# Patient Record
Sex: Female | Born: 1945
Health system: Southern US, Community
[De-identification: ages and names within clinical notes are randomized; demographics above are authoritative.]

## PROBLEM LIST (undated history)

## (undated) DIAGNOSIS — K5790 Diverticulosis of intestine, part unspecified, without perforation or abscess without bleeding: Secondary | ICD-10-CM

## (undated) DIAGNOSIS — R000 Tachycardia, unspecified: Secondary | ICD-10-CM

## (undated) DIAGNOSIS — C801 Malignant (primary) neoplasm, unspecified: Secondary | ICD-10-CM

## (undated) DIAGNOSIS — F419 Anxiety disorder, unspecified: Secondary | ICD-10-CM

## (undated) DIAGNOSIS — Z86010 Personal history of colonic polyps: Secondary | ICD-10-CM

## (undated) DIAGNOSIS — D131 Benign neoplasm of stomach: Secondary | ICD-10-CM

## (undated) DIAGNOSIS — N6019 Diffuse cystic mastopathy of unspecified breast: Secondary | ICD-10-CM

## (undated) DIAGNOSIS — E119 Type 2 diabetes mellitus without complications: Secondary | ICD-10-CM

## (undated) DIAGNOSIS — I10 Essential (primary) hypertension: Secondary | ICD-10-CM

## (undated) DIAGNOSIS — E785 Hyperlipidemia, unspecified: Secondary | ICD-10-CM

## (undated) DIAGNOSIS — H3530 Unspecified macular degeneration: Secondary | ICD-10-CM

## (undated) DIAGNOSIS — K589 Irritable bowel syndrome without diarrhea: Secondary | ICD-10-CM

## (undated) DIAGNOSIS — H4710 Unspecified papilledema: Secondary | ICD-10-CM

## (undated) DIAGNOSIS — K219 Gastro-esophageal reflux disease without esophagitis: Secondary | ICD-10-CM

## (undated) DIAGNOSIS — N3941 Urge incontinence: Secondary | ICD-10-CM

## (undated) DIAGNOSIS — E669 Obesity, unspecified: Secondary | ICD-10-CM

## (undated) DIAGNOSIS — T7840XA Allergy, unspecified, initial encounter: Secondary | ICD-10-CM

## (undated) HISTORY — DX: Irritable bowel syndrome, unspecified: K58.9

## (undated) HISTORY — DX: Diverticulosis of intestine, part unspecified, without perforation or abscess without bleeding: K57.90

## (undated) HISTORY — DX: Essential (primary) hypertension: I10

## (undated) HISTORY — DX: Type 2 diabetes mellitus without complications: E11.9

## (undated) HISTORY — PX: COLONOSCOPY: SHX174

## (undated) HISTORY — DX: Diffuse cystic mastopathy of unspecified breast: N60.19

## (undated) HISTORY — PX: WISDOM TOOTH EXTRACTION: SHX21

## (undated) HISTORY — DX: Gastro-esophageal reflux disease without esophagitis: K21.9

## (undated) HISTORY — PX: PARTIAL HYSTERECTOMY: SHX80

## (undated) HISTORY — DX: Personal history of colonic polyps: Z86.010

## (undated) HISTORY — PX: BREAST EXCISIONAL BIOPSY: SUR124

## (undated) HISTORY — DX: Benign neoplasm of stomach: D13.1

## (undated) HISTORY — DX: Allergy, unspecified, initial encounter: T78.40XA

## (undated) HISTORY — DX: Obesity, unspecified: E66.9

## (undated) HISTORY — PX: POLYPECTOMY: SHX149

## (undated) HISTORY — PX: SHOULDER SURGERY: SHX246

## (undated) HISTORY — PX: KNEE ARTHROSCOPY: SUR90

## (undated) HISTORY — DX: Malignant (primary) neoplasm, unspecified: C80.1

## (undated) HISTORY — DX: Unspecified macular degeneration: H35.30

## (undated) HISTORY — DX: Hyperlipidemia, unspecified: E78.5

## (undated) HISTORY — PX: CHOLECYSTECTOMY: SHX55

## (undated) HISTORY — DX: Urge incontinence: N39.41

## (undated) HISTORY — DX: Tachycardia, unspecified: R00.0

## (undated) HISTORY — DX: Unspecified papilledema: H47.10

## (undated) HISTORY — PX: OTHER SURGICAL HISTORY: SHX169

## (undated) HISTORY — DX: Anxiety disorder, unspecified: F41.9

---

## 2000-09-04 ENCOUNTER — Other Ambulatory Visit: Admission: RE | Admit: 2000-09-04 | Discharge: 2000-09-04 | Payer: Self-pay | Admitting: Obstetrics and Gynecology

## 2001-11-27 ENCOUNTER — Other Ambulatory Visit: Admission: RE | Admit: 2001-11-27 | Discharge: 2001-11-27 | Payer: Self-pay | Admitting: Obstetrics and Gynecology

## 2002-05-31 ENCOUNTER — Emergency Department (HOSPITAL_COMMUNITY): Admission: EM | Admit: 2002-05-31 | Discharge: 2002-05-31 | Payer: Self-pay | Admitting: Emergency Medicine

## 2002-05-31 ENCOUNTER — Encounter: Payer: Self-pay | Admitting: Emergency Medicine

## 2002-08-24 ENCOUNTER — Encounter: Payer: Self-pay | Admitting: Gastroenterology

## 2002-08-24 ENCOUNTER — Ambulatory Visit (HOSPITAL_COMMUNITY): Admission: RE | Admit: 2002-08-24 | Discharge: 2002-08-24 | Payer: Self-pay | Admitting: Gastroenterology

## 2002-09-11 ENCOUNTER — Other Ambulatory Visit: Admission: RE | Admit: 2002-09-11 | Discharge: 2002-09-11 | Payer: Self-pay | Admitting: Obstetrics and Gynecology

## 2004-05-05 ENCOUNTER — Encounter: Admission: RE | Admit: 2004-05-05 | Discharge: 2004-05-05 | Payer: Self-pay | Admitting: General Surgery

## 2004-10-23 ENCOUNTER — Ambulatory Visit: Payer: Self-pay | Admitting: Gastroenterology

## 2004-10-26 ENCOUNTER — Encounter: Admission: RE | Admit: 2004-10-26 | Discharge: 2004-10-26 | Payer: Self-pay | Admitting: Gastroenterology

## 2004-11-21 ENCOUNTER — Encounter: Admission: RE | Admit: 2004-11-21 | Discharge: 2004-11-21 | Payer: Self-pay | Admitting: Pulmonary Disease

## 2005-05-24 ENCOUNTER — Encounter: Admission: RE | Admit: 2005-05-24 | Discharge: 2005-05-24 | Payer: Self-pay | Admitting: General Surgery

## 2005-06-11 ENCOUNTER — Encounter: Admission: RE | Admit: 2005-06-11 | Discharge: 2005-06-11 | Payer: Self-pay | Admitting: General Surgery

## 2005-07-02 ENCOUNTER — Ambulatory Visit: Payer: Self-pay | Admitting: Gastroenterology

## 2005-07-16 ENCOUNTER — Ambulatory Visit: Payer: Self-pay | Admitting: Gastroenterology

## 2005-08-08 ENCOUNTER — Encounter: Admission: RE | Admit: 2005-08-08 | Discharge: 2005-08-08 | Payer: Self-pay | Admitting: Obstetrics and Gynecology

## 2005-08-14 ENCOUNTER — Ambulatory Visit: Payer: Self-pay | Admitting: Cardiology

## 2005-08-14 ENCOUNTER — Ambulatory Visit: Payer: Self-pay | Admitting: Gastroenterology

## 2006-06-14 ENCOUNTER — Encounter: Admission: RE | Admit: 2006-06-14 | Discharge: 2006-06-14 | Payer: Self-pay | Admitting: General Surgery

## 2007-04-03 ENCOUNTER — Ambulatory Visit: Payer: Self-pay | Admitting: Gastroenterology

## 2007-04-10 ENCOUNTER — Ambulatory Visit: Payer: Self-pay | Admitting: Gastroenterology

## 2007-06-24 ENCOUNTER — Encounter: Admission: RE | Admit: 2007-06-24 | Discharge: 2007-06-24 | Payer: Self-pay

## 2008-07-09 ENCOUNTER — Encounter: Admission: RE | Admit: 2008-07-09 | Discharge: 2008-07-09 | Payer: Self-pay | Admitting: Internal Medicine

## 2008-07-30 ENCOUNTER — Encounter: Admission: RE | Admit: 2008-07-30 | Discharge: 2008-07-30 | Payer: Self-pay | Admitting: Orthopaedic Surgery

## 2009-05-30 ENCOUNTER — Encounter: Admission: RE | Admit: 2009-05-30 | Discharge: 2009-05-30 | Payer: Self-pay | Admitting: Obstetrics and Gynecology

## 2009-08-22 ENCOUNTER — Encounter: Payer: Self-pay | Admitting: Internal Medicine

## 2009-12-30 ENCOUNTER — Encounter: Payer: Self-pay | Admitting: Internal Medicine

## 2010-02-10 ENCOUNTER — Encounter: Payer: Self-pay | Admitting: Internal Medicine

## 2010-06-01 ENCOUNTER — Encounter: Admission: RE | Admit: 2010-06-01 | Discharge: 2010-06-01 | Payer: Self-pay | Admitting: Obstetrics and Gynecology

## 2010-06-16 ENCOUNTER — Encounter (INDEPENDENT_AMBULATORY_CARE_PROVIDER_SITE_OTHER): Payer: Self-pay | Admitting: *Deleted

## 2010-08-07 ENCOUNTER — Encounter: Payer: Self-pay | Admitting: Internal Medicine

## 2010-08-08 ENCOUNTER — Ambulatory Visit: Payer: Self-pay | Admitting: Internal Medicine

## 2010-08-08 DIAGNOSIS — G8929 Other chronic pain: Secondary | ICD-10-CM

## 2010-08-08 DIAGNOSIS — R1012 Left upper quadrant pain: Secondary | ICD-10-CM

## 2010-08-08 LAB — CONVERTED CEMR LAB
ALT: 41 units/L — ABNORMAL HIGH (ref 0–35)
Albumin: 4 g/dL (ref 3.5–5.2)
Amylase: 31 units/L (ref 27–131)
BUN: 16 mg/dL (ref 6–23)
CO2: 28 meq/L (ref 19–32)
Creatinine, Ser: 0.9 mg/dL (ref 0.4–1.2)
Glucose, Bld: 111 mg/dL — ABNORMAL HIGH (ref 70–99)
Lipase: 22 units/L (ref 11.0–59.0)
Lymphocytes Relative: 30.1 % (ref 12.0–46.0)
MCV: 90.3 fL (ref 78.0–100.0)
Monocytes Absolute: 0.4 10*3/uL (ref 0.1–1.0)
Platelets: 204 10*3/uL (ref 150.0–400.0)
RBC: 4.33 M/uL (ref 3.87–5.11)
RDW: 12.3 % (ref 11.5–14.6)
Sodium: 140 meq/L (ref 135–145)
Total Protein: 6.8 g/dL (ref 6.0–8.3)
WBC: 5.2 10*3/uL (ref 4.5–10.5)

## 2010-08-10 ENCOUNTER — Ambulatory Visit: Payer: Self-pay | Admitting: Internal Medicine

## 2010-11-09 ENCOUNTER — Encounter: Payer: Self-pay | Admitting: Cardiovascular Disease

## 2010-12-21 ENCOUNTER — Ambulatory Visit (HOSPITAL_COMMUNITY)
Admission: RE | Admit: 2010-12-21 | Discharge: 2010-12-21 | Payer: Self-pay | Source: Home / Self Care | Attending: Cardiovascular Disease | Admitting: Cardiovascular Disease

## 2010-12-21 ENCOUNTER — Ambulatory Visit: Admission: RE | Admit: 2010-12-21 | Discharge: 2010-12-21 | Payer: Self-pay | Source: Home / Self Care

## 2010-12-21 ENCOUNTER — Other Ambulatory Visit: Payer: Self-pay | Admitting: Cardiovascular Disease

## 2010-12-21 ENCOUNTER — Ambulatory Visit
Admission: RE | Admit: 2010-12-21 | Discharge: 2010-12-21 | Payer: Self-pay | Source: Home / Self Care | Attending: Cardiovascular Disease | Admitting: Cardiovascular Disease

## 2010-12-21 ENCOUNTER — Encounter: Payer: Self-pay | Admitting: Cardiovascular Disease

## 2010-12-21 ENCOUNTER — Ambulatory Visit: Admit: 2010-12-21 | Payer: Self-pay

## 2010-12-21 DIAGNOSIS — R002 Palpitations: Secondary | ICD-10-CM | POA: Insufficient documentation

## 2010-12-21 LAB — BASIC METABOLIC PANEL
BUN: 17 mg/dL (ref 6–23)
CO2: 28 mEq/L (ref 19–32)
Calcium: 9.4 mg/dL (ref 8.4–10.5)
Chloride: 102 mEq/L (ref 96–112)
Creatinine, Ser: 0.8 mg/dL (ref 0.4–1.2)
GFR: 72 mL/min (ref >60.00)
Glucose, Bld: 101 mg/dL — ABNORMAL HIGH (ref 70–99)
Potassium: 3.9 mEq/L (ref 3.5–5.1)
Sodium: 136 mEq/L (ref 135–145)

## 2010-12-21 LAB — TSH: TSH: 1.57 u[IU]/mL (ref 0.35–5.50)

## 2010-12-28 ENCOUNTER — Ambulatory Visit
Admission: RE | Admit: 2010-12-28 | Discharge: 2010-12-28 | Payer: Self-pay | Source: Home / Self Care | Attending: Cardiovascular Disease | Admitting: Cardiovascular Disease

## 2010-12-30 ENCOUNTER — Encounter: Payer: Self-pay | Admitting: Gastroenterology

## 2010-12-31 ENCOUNTER — Encounter: Payer: Self-pay | Admitting: General Surgery

## 2011-01-09 NOTE — Letter (Signed)
Summary: [facility]  [facility]   Imported By: Lester Bardwell 08/15/2010 12:12:48  _____________________________________________________________________  External Attachment:    Type:   Image     Comment:   External Document

## 2011-01-09 NOTE — Letter (Signed)
Summary: [facility]  [facility]   Imported By: Lester [place] Hall 08/15/2010 12:08:29  _____________________________________________________________________  External Attachment:    Type:   Image     Comment:   External Document

## 2011-01-09 NOTE — Assessment & Plan Note (Signed)
Summary: UPPER ABD PAIN...AS.   History of Present Illness Visit Type: Initial Visit Primary GI MD: Stan Head MD [facility] Primary Provider: Iven Finn Requesting Provider: n/a Chief Complaint: Upper left abdominal discomfort History of Present Illness:   65 yo white woman with left side and/or upper quadrant pain since early 2011. She noticed a pain with a zipper on that side at first. she has had chronic and intermittent soreness in this area, can flare and "draw". Alot of pressure and uncomfortable feelings, worse with sitting.  Not affected by eating. Probably moreso with movement or twisting. Nausea has been an issue. She noticed most when husband had an MI in April. He is finishing cardiac rehab now after CABG.  No change in defecation.   She had seen Dr. Annabell Howells earlier in the year. She took medication (sounds like trimethoprim-sulfamethoxazole) x 3 mos. She returns in September for follow-up. she had an [place] and cysto but no CT, she thinks.  She believes this is a new problem but had seen Dr. Victorino Dike with similar though LLQ complaints in past, and more than once.   GI Review of Systems    Reports abdominal pain and  nausea.     Location of  Abdominal pain: LUQ.    Denies acid reflux, belching, bloating, chest pain, dysphagia with liquids, dysphagia with solids, heartburn, loss of appetite, vomiting, vomiting blood, weight loss, and  weight gain.      Reports diarrhea.     Denies anal fissure, black tarry stools, change in bowel habit, constipation, diverticulosis, fecal incontinence, heme positive stool, hemorrhoids, irritable bowel syndrome, jaundice, light color stool, liver problems, rectal bleeding, and  rectal pain.    Current Medications (verified): 1)  Pravachol 40 Mg Tabs (Pravastatin Sodium) .Marland Kitchen.. 1 By Mouth Two Times A Day 2)  Lovaza 1 Gm Caps (Omega-3-Acid Ethyl Esters) .Marland Kitchen.. 1 By Mouth Three Times A Day 3)  Bisoprolol-Hydrochlorothiazide 2.5-6.25 Mg Tabs  (Bisoprolol-Hydrochlorothiazide) .Marland Kitchen.. 1 By Mouth Once Daily 4)  Coenzyme Q10 10 Mg Caps (Coenzyme Q10) .Marland Kitchen.. 1 By Mouth Once Daily 5)  Daily Multi  Tabs (Multiple Vitamins-Minerals) .Marland Kitchen.. 1 By Mouth Once Daily 6)  Chromium Picolinate 200 Mcg Tabs (Chromium Picolinate) .Marland Kitchen.. 1 By Mouth Once Daily  Allergies (verified): No Known Drug Allergies  Past History:  Past Medical History: Diverticulosis Hemorrhoids Hypertension Fibrocystic Breast Disease Hyperlipidemia, esp TG's  Past Surgical History: Reviewed history from 08/07/2010 and no changes required. Cholecystectomy Hysterectomy Knee Arthroscopy - L Breast Surgery  Family History: Reviewed history from 08/07/2010 and no changes required. Family History of Diabetes: Father, Mother Family History of Heart Disease: Father, Mother Family History of Liver Disease/Cirrhosis: Grandmother Family History of Colon Cancer: Mat. 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         All other ROS negative except as per HPI.   Vital Signs:  Patient profile:   65 year old female Height:      64 inches Weight:      180 pounds BMI:     31.01 BSA:     1.87 Pulse rate:   68 / minute Pulse rhythm:   regular BP sitting:   124 / 72  Vitals Entered By: Merri Ray CMA Duncan Dull) (August 08, 2010 9:10 AM)  Physical Exam  General:  obese, NAD Eyes:  PERRLA, no icterus. Mouth:  No deformity or lesions, dentition normal. Neck:  Supple; no masses or thyromegaly. Lungs:  Clear throughout to auscultation. Heart:  Regular rate and rhythm; no murmurs, rubs,  or bruits. Abdomen:  soft, obese, BS+ and no bruits tender to deeper palpation left mid quadrant and flanck involuntary guarding also no HSM/mass Msk:  ribs non-tender Extremities:  No clubbing, cyanosis, edema or deformities  noted. Neurologic:  Alert and  oriented x4;  grossly normal neurologically. Cervical Nodes:  No significant cervical or supraclavicular adenopathy.  Psych:  Alert and cooperative. Normal mood and affect.   Impression & Recommendations:  Problem # 1:  ABDOMINAL PAIN OTHER SPECIFIED SITE (ICD-789.09) Assessment New  LUQ, flank pain and tenderness in left mid quadrant,lateral. Sounds like she had some type of cystitis.  ? pancreatic or splenic cause. Does not sound gastric as unaffected by food. Could be diverticulitis. From history I ?ed musculoskeletal but she was not tender on ribs or with muscle tension of abdominal wall. CT and labs, review GU records. Chart review does show similar complants in past.  Orders: CT Abdomen/Pelvis with Contrast (CT Abd/Pelvis w/con) TLB-CBC Platelet - w/Differential (85025-CBCD) TLB-CMP (Comprehensive Metabolic Pnl) (80053-COMP) TLB-Amylase (82150-AMYL) TLB-Lipase (83690-LIPASE)  Patient Instructions: 1)  Please go to the basement to have your lab tests drawn today.  2)  Your CT is scheduled at [facility] on Thursday, August 10, 2010 @ 9:30am. 3)  We will call you with further follow up after reviewing these results.  4)  We will obtain your records from Dr. Annabell Howells. 5)  Please continue current medications.  6)  Copy sent to : Juline Patch, MD, Bjorn Pippin, MD 7)  The medication list was reviewed and reconciled.  All changed / newly prescribed medications were explained.  A complete medication list was provided to the patient / caregiver. Patient: Frances Brown Note: All result statuses are Final unless otherwise noted.  Tests: (1) CBC Platelet w/Diff (CBCD)   White Cell Count          5.2 K/uL                    4.5-10.5   Red Cell Count            4.33 Mil/uL                 3.87-5.11   Hemoglobin                13.7 g/dL                   54.0-98.1   Hematocrit                39.1 %                      36.0-46.0   MCV                        90.3 fl                     78.0-100.0   MCHC                      35.0 g/dL                   19.1-47.8   RDW  12.3 %                      11.5-14.6   Platelet Count            204.0 K/uL                  150.0-400.0   Neutrophil %              57.7 %                      43.0-77.0   Lymphocyte %              30.1 %                      12.0-46.0   Monocyte %                8.1 %                       3.0-12.0   Eosinophils%              3.5 %                       0.0-5.0   Basophils %               0.6 %                       0.0-3.0   Neutrophill Absolute      3.0 K/uL                    1.4-7.7   Lymphocyte Absolute       1.6 K/uL                    0.7-4.0   Monocyte Absolute         0.4 K/uL                    0.1-1.0  Eosinophils, Absolute                             0.2 K/uL                    0.0-0.7   Basophils Absolute        0.0 K/uL                    0.0-0.1  Tests: (2) CMP (COMP)   Sodium                    140 mEq/L                   135-145   Potassium                 4.8 mEq/L                   3.5-5.1   Chloride                  104 mEq/L                   96-112   Carbon Dioxide            28 mEq/L  19-32   Glucose              [H]  111 mg/dL                   11-91   BUN                       16 mg/dL                    4-78   Creatinine                0.9 mg/dL                   2.9-5.6   Total Bilirubin           0.8 mg/dL                   2.1-3.0   Alkaline Phosphatase      60 U/L                      39-117   AST                       30 U/L                      0-37   ALT                  [H]  41 U/L                      0-35   Total Protein             6.8 g/dL                    8.6-5.7   Albumin                   4.0 g/dL                    8.4-6.9   Calcium                   9.5 mg/dL                   6.2-95.2   GFR                       64.89 mL/min                >60  Tests: (3) Amylase (AMYL)   Amylase                    31 U/L                      27-131  Tests: (4) Lipase (LIPASE)   Lipase                    22.0 U/L                    11.0-59.0  Note: An exclamation mark (!) indicates a result that was not dispersed into the flowsheet. Document Creation Date: 08/08/2010 11:35 AM

## 2011-01-09 NOTE — Procedures (Signed)
Summary: Colonoscopy: Dr. Doreatha Martin: Diverticulosis (04-10-07)   Colonoscopy  Procedure date:  04/10/2007  Findings:      Results: Diverticulosis. Location:  [facility].   Findings - DIVERTICULOSIS: Splenic Flexure to Sigmoid Colon.  Procedures Next Due Date:    Colonoscopy: 04/2015  Patient Name: Frances Brown, Frances Brown MRN:  Procedure Procedures: Colonoscopy CPT: 16109.  Personnel: Endoscopist: Ulyess Mort, MD.  Exam Location: Exam performed in Outpatient Clinic. Outpatient  Patient Consent: Procedure, Alternatives, Risks and Benefits discussed, consent obtained, from patient. Consent was obtained by the RN.  Indications  Surveillance of: Adenomatous Polyp(s).  History  Current Medications: Patient is not currently taking Coumadin.  Pre-Exam Physical: Performed Jul 02, 2002. Entire physical exam was normal. Rectal exam, Extremity exam, Mental status exam WNL.  Comments: Pt. history reviewed/updated, physical exam performed prior to initiation of sedation? Exam Exam: Extent of exam reached: Cecum, extent intended: Cecum.  The cecum was identified by appendiceal orifice and IC valve. Colon retroflexion performed. Images taken. ASA Classification: II. Tolerance: good.  Monitoring: Pulse and BP monitoring, Oximetry used. Supplemental O2 given.  Sedation Meds: Patient assessed and found to be appropriate for moderate (conscious) sedation. Fentanyl given IV. Versed given IV.  Findings - DIVERTICULOSIS: Splenic Flexure to Sigmoid Colon. ICD9: Diverticulosis, Colon: 562.10. Comments: mild.   Assessment Abnormal examination, see findings above.  Diagnoses: 562.10: Diverticulosis, Colon.   Events  Unplanned Interventions: No intervention was required.  Unplanned Events: There were no complications. Plans Medication Plan: Continue current medications.  Patient Education: Patient given standard instructions for: Diverticulosis. Patient instructed  to get routine colonoscopy every 8 years.  Disposition: After procedure patient sent to recovery. After recovery patient sent home.   This report was created from the original endoscopy report, which was reviewed and signed by the above listed endoscopist.

## 2011-01-09 NOTE — Letter (Signed)
Summary: New Patient letter  Frances Brown - [facility]  [address] [place], [place] [address]   Phone: [phone]  Fax: [phone]       06/16/2010 MRN: [id number]  [facility] [address] [place] [address] [place], [place]  [address]  Dear [name],  Welcome to the Gastroenterology Division at [facility].    You are scheduled to see Dr. Leone Payor on 08/08/2010 at 9:00AM on the 3rd floor at [facility], [address].  We ask that you try to arrive at our office 15 minutes prior to your appointment time to allow for check-in.  We would like you to complete the enclosed self-administered evaluation form prior to your visit and bring it with you on the day of your appointment.  We will review it with you.  Also, please bring a complete list of all your medications or, if you prefer, bring the medication bottles and we will list them.  Please bring your insurance card so that we may make a copy of it.  If your insurance requires a referral to see a specialist, please bring your referral form from your primary care physician.  Co-payments are due at the time of your visit and may be paid by cash, check or credit card.     Your office visit will consist of a consult with your physician (includes a physical exam), any laboratory testing he/she may order, scheduling of any necessary diagnostic testing (e.g. x-ray, ultrasound, CT-scan), and scheduling of a procedure (e.g. Endoscopy, Colonoscopy) if required.  Please allow enough time on your schedule to allow for any/all of these possibilities.    If you cannot keep your appointment, please call [phone] to cancel or reschedule prior to your appointment date.  This allows [place] the opportunity to schedule an appointment for another patient in need of care.  If you do not cancel or reschedule by 5 p.m. the business day prior to your appointment date, you will be charged a $50.00 late cancellation/no-show fee.    Thank you for choosing  [facility] for your medical needs.  We appreciate the opportunity to care for you.  Please visit [place] at our website  to learn more about our practice.                     Sincerely,                                                             The Gastroenterology Division

## 2011-01-11 NOTE — Assessment & Plan Note (Signed)
Summary: 1wk f/u echo/holter done 12-21-10/sl   Visit Type:  1 wk f/u Referring Provider:  n/a Primary Provider:  Iven Finn  CC:  states palpitations from anxiety.... .  History of Present Illness: 65 yo female with history of HTN, HL and borderline DM here today for follow up. I saw her as a new pt one week ago for evaluation of palpitations and leg pain. I follow her husband and she was a self referral. She told  me that she has been under much stress at home. She developed a rash on her right arm from presumed poison oak. She was put on Prednisone and received IV steroids. She could feel her heart racing. She followed up with a dermatologist and this was felt to be related to contact with poison oak. Her heart has been racing and skipping beats for several months. Worsened with use of prednisone. This occurs at least 3-4 times per week. She has no prior cardiac issues. No chest pain or SOB. She also describes cramps in her legs and she stopped her statin. The leg pain is much better off of the statin.  I arranged a 48 Holter monitor and an echo. Her echo showed normal LV size and function. Her Holter monitor showed occasional premature atrial contractions but no VT or atrial fibrillation. She continues to have some palpitations but no prolonged episodes. No other complaints.   Current Medications (verified): 1)  Lovaza 1 Gm Caps (Omega-3-Acid Ethyl Esters) .Marland Kitchen.. 1 By Mouth Three Times A Day 2)  Bisoprolol-Hydrochlorothiazide 2.5-6.25 Mg Tabs (Bisoprolol-Hydrochlorothiazide) .Marland Kitchen.. 1 By Mouth Once Daily 3)  Coenzyme Q10 10 Mg Caps (Coenzyme Q10) .Marland Kitchen.. 1 By Mouth Once Daily 4)  Daily Multi  Tabs (Multiple Vitamins-Minerals) .Marland Kitchen.. 1 By Mouth Once Daily 5)  Chromium Picolinate 200 Mcg Tabs (Chromium Picolinate) .Marland Kitchen.. 1 By Mouth Once Daily 6)  Vitamin D3 2000 Unit Caps (Cholecalciferol) .Marland Kitchen.. 1 Cap Once Daily  Allergies (verified): No Known Drug Allergies  Past History:  Past Medical  History: Diverticulosis Hypertension Fibrocystic Breast Disease Hyperlipidemia, esp TG's Borderline DM Premature atrial contractions  Social History: Reviewed history from 12/21/2010 and no changes required. 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  The patient denies fatigue, malaise, fever, weight gain/loss, vision loss, decreased hearing, hoarseness, chest pain, shortness of breath, prolonged cough, wheezing, sleep apnea, coughing up blood, abdominal pain, blood in stool, nausea, vomiting, diarrhea, heartburn, incontinence, blood in urine, muscle weakness, joint pain, leg swelling, rash, skin lesions, headache, fainting, dizziness, depression, anxiety, enlarged lymph nodes, easy bruising or bleeding, and environmental allergies.    Vital Signs:  Patient profile:   65 year old female Height:      64 inches Weight:      175 pounds BMI:     30.15 BP sitting:   142 / 78  (left arm) Cuff size:   large  Vitals Entered By: Danielle Rankin, CMA (December 28, 2010 9:54 AM)  Physical Exam  General:  General: Well developed, well nourished, NAD Musculoskeletal: Muscle strength 5/5 all ext Psychiatric: Mood and affect normal Neck: No JVD, no carotid bruits, no thyromegaly, no lymphadenopathy. Lungs:Clear bilaterally, no wheezes, rhonci, crackles CV: RRR no murmurs, gallops rubs Abdomen: soft, NT, ND, BS present Extremities: No edema, pulses 2+.    Echocardiogram  Procedure date:  12/21/2010  Findings:      Left ventricle: The cavity size was normal. Wall thickness was       normal. Systolic function was normal. The estimated ejection       fraction was in the range of 55% to 60%. Wall motion was normal;       there were no regional wall motion abnormalities. Doppler       parameters are consistent with abnormal left ventricular        relaxation (grade 1 diastolic dysfunction).     - Mitral valve: Mild regurgitation.  Holter Monitor  Procedure date:  12/25/2010  Findings:      Sinus rhythm, PACs-occasional. Report indicates 733 PACs but many of these appear to be artifact. No VT or atrial fibrillation.   Impression & Recommendations:  Problem # 1:  PALPITATIONS (ICD-785.1) Likely secondary to PACs as seen on the Holter monitor. LV function is normal. No further workup at this time. She is asked to avoid stimulants such as caffeine.    Her updated medication list for this problem includes:    Bisoprolol-hydrochlorothiazide 2.5-6.25 Mg Tabs (Bisoprolol-hydrochlorothiazide) .Marland Kitchen... 1 by mouth once daily  Patient Instructions: 1)  Your physician recommends that you schedule a follow-up appointment in: 1 year 2)  Your physician recommends that you continue on your current medications as directed. Please refer to the Current Medication list given to you today.

## 2011-01-11 NOTE — Assessment & Plan Note (Signed)
Summary: np6/wpa   Visit Type:  Initial Consult Referring Provider:  n/a Primary Provider:  Iven Finn   History of Present Illness: 65 yo female with history of HTN, HL and borderline DM here today as a new patient for evaluation of palpitations and leg pain. I follow her husband and she is a self referral. She tells me that she has been under much stress at home. She developed a rash on her right arm from presumed poison oak. She was put on Prednisone and received IV steroids. She could feel her heart racing. She followed up with a dermatologist and this was felt to be related to contact with poison oak. Her heart has been racing and skipping beats for several months. Worsened with use of prednisone. This occurs at least 3-4 times per week. She has no prior cardiac issues. No chest pain or SOB. She also describes cramps in her legs and she stopped her statin several days ago. The leg pain is much better off of the statin.   Current Medications (verified): 1)  Pravachol 40 Mg Tabs (Pravastatin Sodium) .Marland Kitchen.. 1 By Mouth Two Times A Day--Hold 2)  Lovaza 1 Gm Caps (Omega-3-Acid Ethyl Esters) .Marland Kitchen.. 1 By Mouth Three Times A Day 3)  Bisoprolol-Hydrochlorothiazide 2.5-6.25 Mg Tabs (Bisoprolol-Hydrochlorothiazide) .Marland Kitchen.. 1 By Mouth Once Daily 4)  Coenzyme Q10 10 Mg Caps (Coenzyme Q10) .Marland Kitchen.. 1 By Mouth Once Daily 5)  Daily Multi  Tabs (Multiple Vitamins-Minerals) .Marland Kitchen.. 1 By Mouth Once Daily 6)  Chromium Picolinate 200 Mcg Tabs (Chromium Picolinate) .Marland Kitchen.. 1 By Mouth Once Daily  Allergies (verified): No Known Drug Allergies  Past History:  Past Medical History: Diverticulosis Hypertension Fibrocystic Breast Disease Hyperlipidemia, esp TG's Borderline DM  Past Surgical History: Cholecystectomy Hysterectomy Shoulder repair-right Knee Arthroscopy - L Breast Lymph node removed  Family History: Reviewed history from 08/07/2010 and no changes required. Family History of Diabetes: Father,  Mother Family History of Heart Disease: Father, Mother Family History of Liver Disease/Cirrhosis: Grandmother Family History of Colon Cancer: Mat. Uncle, dx'd 46's  Father-deceased MI age 37 Mother-deceased age 62 with multiple medical problems-kidney failure Sister-HTN  Social History: Reviewed history from 08/08/2010 and no changes required. 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  The patient denies fatigue, malaise, fever, weight gain/loss, vision loss, decreased hearing, hoarseness, chest pain, shortness of breath, prolonged cough, wheezing, sleep apnea, coughing up blood, abdominal pain, blood in stool, nausea, vomiting, diarrhea, heartburn, incontinence, blood in urine, muscle weakness, joint pain, leg swelling, rash, skin lesions, headache, fainting, dizziness, depression, anxiety, enlarged lymph nodes, easy bruising or bleeding, and environmental allergies.         Leg pain. See HPI  Vital Signs:  Patient profile:   65 year old female Height:      64 inches Weight:      171 pounds BMI:     29.46 Pulse rate:   67 / minute Resp:     16 per minute BP sitting:   122 / 80  (right arm)  Vitals Entered By: Marrion Coy, CNA (December 21, 2010 8:51 AM)  Physical Exam  General:  General: Well developed, well nourished, NAD HEENT: OP clear, mucus membranes moist SKIN: warm, dry Neuro: No focal deficits Musculoskeletal: Muscle strength 5/5 all ext Psychiatric: Mood and affect normal Neck: No JVD, no  carotid bruits, no thyromegaly, no lymphadenopathy. Lungs:Clear bilaterally, no wheezes, rhonci, crackles CV: RRR no murmurs, gallops rubs Abdomen: soft, NT, ND, BS present Extremities: No edema, pulses 2+.    EKG  Procedure date:  12/21/2010  Findings:      NSR, rate 67 bpm.  Impression & Recommendations:  Problem # 1:   PALPITATIONS (ICD-785.1) Likely premature beats. EKG normal. Will check echo, TSH, BMET and have her wear a 48 hour holter montior.   Her updated medication list for this problem includes:    Bisoprolol-hydrochlorothiazide 2.5-6.25 Mg Tabs (Bisoprolol-hydrochlorothiazide) .Marland Kitchen... 1 by mouth once daily  Other Orders: EKG w/ Interpretation (93000) TLB-BMP (Basic Metabolic Panel-BMET) (80048-METABOL) TLB-TSH (Thyroid Stimulating Hormone) (84443-TSH) Holter (Holter) Echocardiogram (Echo)  Patient Instructions: 1)  Your physician recommends that you schedule a follow-up appointment in: 3-4 weeks. 2)  Your physician recommends that you continue on your current medications as directed. Please refer to the Current Medication list given to you today. 3)  Your physician has requested that you have an echocardiogram.  Echocardiography is a painless test that uses sound waves to create images of your heart. It provides your doctor with information about the size and shape of your heart and how well your heart's chambers and valves are working.  This procedure takes approximately one hour. There are no restrictions for this procedure. 4)  Your physician has recommended that you wear a 48 hour holter monitor.  Holter monitors are medical devices that record the heart's electrical activity. Doctors most often use these monitors to diagnose arrhythmias. Arrhythmias are problems with the speed or rhythm of the heartbeat. The monitor is a small, portable device. You can wear one while you do your normal daily activities. This is usually used to diagnose what is causing palpitations/syncope (passing out).

## 2011-01-16 ENCOUNTER — Telehealth: Payer: Self-pay | Admitting: Cardiovascular Disease

## 2011-01-22 ENCOUNTER — Telehealth: Payer: Self-pay | Admitting: Cardiovascular Disease

## 2011-01-25 NOTE — Progress Notes (Addendum)
Summary: pt has questions about b/p  Phone Note Call from Patient Call back at Home Phone [phone]   Caller: Patient Reason for Call: Talk to Nurse, Talk to Doctor Summary of Call: pt b/p was taken yesterday and it was 142/80 and today she had it taken again and it was 168/80 she changed the time she takes her b/p from at night to in the am and she wonders dose that make a difference Initial call taken by: Omer Jack,  January 16, 2011 12:07 PM  Follow-up for Phone Call        Patient said that she went to her chiropractor and he took her BP and said that it was 168/80. She had a slight headache this morning but feels better now. She has been taking her BP medicine at night and was going to start taking them in the morning again. I advised her to do this and start taking her BP 1-2 hours after taking her medicine. She does not have a machine @ home but is going back to her chiropractor Thursday and will have it rechecked then. If she continues to feel bad, she will call back & come in for a BP check. Whitney Maeola Sarah RN  January 16, 2011 1:41 PM  Follow-up by: Whitney Maeola Sarah RN,  January 16, 2011 1:41 PM     Appended Document: pt has questions about b/p She can double her bisoprolol/HCTZ if BP remains elevated. thanks, cdm  Appended Document: pt has questions about b/p   Patient's BP 140/74 when she had it checked at her PCP . She would like to try doubling her Bisoprolol-HCTZ over the weekend and calling us back Monday to let us know how she is feeling.  Whitney Maeola Sarah RN  January 19, 2011 8:30 AM   See phone note. Whitney Maeola Sarah RN  January 23, 2011 8:35 AM  Clinical Lists Changes  Medications: Changed medication from BISOPROLOL-HYDROCHLOROTHIAZIDE 2.5-6.25 MG TABS (BISOPROLOL-HYDROCHLOROTHIAZIDE) 1 by mouth once daily to BISOPROLOL-HYDROCHLOROTHIAZIDE 5-6.25 MG TABS (BISOPROLOL-HYDROCHLOROTHIAZIDE) take one tablet by mouth daily.

## 2011-02-06 NOTE — Progress Notes (Signed)
Summary: returning your call  Phone Note Call from Patient Call back at Home Phone [phone]   Caller: Patient Reason for Call: Talk to Nurse Summary of Call: pt returning your call. pt states she missed your call. Initial call taken by: Roe Coombs,  January 22, 2011 4:35 PM  Follow-up for Phone Call        Patient went to see her chiropractor and her BP was improved at 142/70. Her PCP prescribed her 50mg  Losartan but it is making her dizzy. She is very apprehensive about doubling her Bisoprolol-HCTZ or starting the Losartan. She is also concerned that her BP hasn't been read accurately so I suggested she come into the office tomorrow and I will check her BP and we can go from there. She agrees. Whitney Maeola Sarah RN  January 23, 2011 8:34 AM  Patient's BP in the office was 140/80 but she  is very nervous that it is too high. Dr. Clifton James had suggested she double her Bisoprolol-HCTZ to 5-6.25mg  earlier when her BP was elevated. She is reluctact to do this. She would like to take her orignial dose of 2.5-6.25mg  of Bisoprolol-HCTZ and an extra 1/2 pill. I informed her that this should be okay & she will call us next week with an update of her BP. Whitney Maeola Sarah RN  January 24, 2011 2:21 PM  Follow-up by: Whitney Maeola Sarah RN,  January 23, 2011 8:34 AM  Additional Follow-up for Phone Call Additional follow up Details #1::        Spoke with patient and her BP was 132/82. She is feeling okay but is still experiencing slight dizziness when she stands up in the morning. I advised her to sit a few min. on the side of the bed before standing and to let us know if this continues. She agrees. Whitney Maeola Sarah RN  February 02, 2011 10:19 AM  Additional Follow-up by: Whitney Maeola Sarah RN,  February 02, 2011 10:19 AM

## 2011-02-08 ENCOUNTER — Encounter: Payer: Self-pay | Admitting: Cardiovascular Disease

## 2011-03-20 ENCOUNTER — Ambulatory Visit: Payer: Medicare Other | Attending: Internal Medicine | Admitting: Physical Therapy

## 2011-03-20 DIAGNOSIS — IMO0001 Reserved for inherently not codable concepts without codable children: Secondary | ICD-10-CM | POA: Insufficient documentation

## 2011-03-20 DIAGNOSIS — R42 Dizziness and giddiness: Secondary | ICD-10-CM | POA: Insufficient documentation

## 2011-03-20 DIAGNOSIS — R269 Unspecified abnormalities of gait and mobility: Secondary | ICD-10-CM | POA: Insufficient documentation

## 2011-05-21 ENCOUNTER — Encounter: Payer: Self-pay | Admitting: Internal Medicine

## 2011-05-21 ENCOUNTER — Ambulatory Visit (INDEPENDENT_AMBULATORY_CARE_PROVIDER_SITE_OTHER): Payer: Medicare Other | Admitting: Internal Medicine

## 2011-05-21 VITALS — BP 126/72 | HR 68 | Ht 65.0 in | Wt 178.6 lb

## 2011-05-21 DIAGNOSIS — K589 Irritable bowel syndrome without diarrhea: Secondary | ICD-10-CM

## 2011-05-21 MED ORDER — DICYCLOMINE HCL 10 MG PO CAPS: ORAL_CAPSULE | ORAL | Status: DC

## 2011-05-21 NOTE — Patient Instructions (Addendum)
You may pick up your prescription from your pharmacy. If you are not better in 3-4 weeks please give [place] a call back.

## 2011-05-23 ENCOUNTER — Encounter: Payer: Self-pay | Admitting: Internal Medicine

## 2011-05-23 DIAGNOSIS — K589 Irritable bowel syndrome without diarrhea: Secondary | ICD-10-CM | POA: Insufficient documentation

## 2011-05-23 NOTE — Assessment & Plan Note (Addendum)
The overall situation with his lady is consistent with IBS problems. She is to use dicyclomine for symptomatic relief. Hopefully as her stressors decline her GI symptoms will improve. If she is not better in 3-4 weeks she will call back. I don't think further diagnostic testing is needed at this time. Her pain is in the left upper quadrant but the symptoms are more bowel related. That is why I have not pursued an upper endoscopy. We'll see how she does, hopefully the dicyclomine will work, I think she needs to be more compliant with that.

## 2011-05-23 NOTE — Progress Notes (Signed)
  Subjective:    Patient ID: Frances Brown, female    DOB: 03-07-1946, 65 y.o.   MRN: [id number]  HPI 65 year old white woman last seen in August 2011 with left upper quadrant and left side pain. She is describing a similar-like problem with a rash feeling in her left side. At that time in August she had a CPE labs and a GU evaluation which were unrevealing. She seemed to get better. She had been prescribed dicyclomine and thinks that that helps some. She recently started some trimethoprim when she had a development of her current wall ceiling and soreness in her left upper quadrant and mid quadrant area. There were some transient diarrhea. The trimethoprim was around for possible UTI. She has not had any fevers or urinary tract infections. There have been no signs of rectal bleeding or blood in the stool. Overall she is better but still feels a little bit nauseous and sore. She has been under considerable stress with her son being diagnosed as a bipolar patient, he lost his health insurance and she is helping to pay for his medical care at this time. He is not working as well. I think he may be living with the patient and her husband. Her husband had a myocardial infarction in the last year and has been stressful as well.    Review of Systems     Objective:   Physical Exam Well-developed well-nourished white woman no acute distress Eyes anicteric Lungs clear Heart S1-S2 no rubs or gallops The abdomen is soft and nontender without organomegaly or masses, bowel sounds are present Mood is appropriate as well as affect neuro she is alert x3       Assessment & Plan:

## 2011-06-12 ENCOUNTER — Other Ambulatory Visit: Payer: Self-pay | Admitting: Internal Medicine

## 2011-06-12 DIAGNOSIS — Z1231 Encounter for screening mammogram for malignant neoplasm of breast: Secondary | ICD-10-CM

## 2011-06-19 ENCOUNTER — Ambulatory Visit
Admission: RE | Admit: 2011-06-19 | Discharge: 2011-06-19 | Disposition: A | Discharge status: Home / Self Care | Payer: Medicare Other | Source: Ambulatory Visit | Attending: Internal Medicine | Admitting: Internal Medicine

## 2011-06-19 DIAGNOSIS — Z1231 Encounter for screening mammogram for malignant neoplasm of breast: Secondary | ICD-10-CM

## 2011-07-25 ENCOUNTER — Telehealth: Payer: Self-pay | Admitting: Internal Medicine

## 2011-07-25 NOTE — Telephone Encounter (Signed)
Patient is having the same symptoms as she was in June.  She reports that the dicyclomine is not helping.  She thinks she feels a "knot" in her side "it may be fat".  Per June office visit with Dr Leone Payor abdominal exam was normal.  The patient is scheduled to see Dr Leone Payor on 08/23/11.  I have placed her on the cancellation list and asked her to call and check for cancellations also.

## 2011-08-02 ENCOUNTER — Telehealth: Payer: Self-pay | Admitting: Internal Medicine

## 2011-08-02 NOTE — Telephone Encounter (Signed)
I spoke with the patient she is c/o a lump on her LUQ she saw her primary care and he didn't know what it was.  She complains of abdominal pain and wants to have her appt moved up.  I have asked her to resume her dicyclomine as she doesn't take it at all.  She is asked to take 1-2 ac meals as prescribed.  I have rescheduled her appt for 08/16/11

## 2011-08-16 ENCOUNTER — Encounter: Payer: Self-pay | Admitting: Internal Medicine

## 2011-08-16 ENCOUNTER — Ambulatory Visit (INDEPENDENT_AMBULATORY_CARE_PROVIDER_SITE_OTHER): Payer: Medicare Other | Admitting: Internal Medicine

## 2011-08-16 DIAGNOSIS — K589 Irritable bowel syndrome without diarrhea: Secondary | ICD-10-CM

## 2011-08-16 DIAGNOSIS — R1012 Left upper quadrant pain: Secondary | ICD-10-CM

## 2011-08-16 DIAGNOSIS — G8929 Other chronic pain: Secondary | ICD-10-CM

## 2011-08-16 DIAGNOSIS — E669 Obesity, unspecified: Secondary | ICD-10-CM | POA: Insufficient documentation

## 2011-08-16 MED ORDER — DICYCLOMINE HCL 20 MG PO TABS: ORAL_TABLET | ORAL | Status: DC

## 2011-08-16 NOTE — Patient Instructions (Signed)
Your prescription(s) has(have) been sent to your pharmacy for you to pick up (Dicyclomine). Try to reduce your meal portion size and try to lose some weight. Return to see Dr. Leone Payor in about 6 weeks.

## 2011-08-16 NOTE — Progress Notes (Signed)
  Subjective:    Patient ID: Frances Brown, female    DOB: August 04, 1946, 65 y.o.   MRN: [id number]  HPI She was seen in June with left upper quadrant pain and dicyclomine was prescribed. Seemed to relieve the pain but not remove it. She continued to have problems at night. She saw her primary care physician and an empiric trial of antibiotics was tried for possible diverticulitis. That resulted in a yeast infection but did not really resolve things either. She stopped all her vitamins but the pain has persisted. She tells me there is a constant knot-like pain in the left lateral upper quadrant area. It is not disturbing sleep right now. He can feel a knot the size of a pea in the abdominal. She is also limited artificial sweeteners without benefit. She ate nuts recently and that caused a flare of his pain.   Review of Systems Son's mental illness is better and her stress is less    Objective:   Physical Exam Obese, no acute distress Somewhat anxious Abdomen is soft, there is no real tenderness though if I could press deeply in the left upper quadrant lateral aspect she will have some discomfort. There is no abdominal wall discomfort. Bowel sounds are present there is no organomegaly or mass or hernia. There is a very small subcutaneous nodule in the abdominal wall fatty tissues in the left upper quadrant closer to the midline. It is perhaps slightly tender.       Assessment & Plan:

## 2011-08-16 NOTE — Assessment & Plan Note (Addendum)
See I BS assessment. I think this is overall a chronic functional abdominal pain. We'll continue the dicyclomine. A tricyclic agent could be needed but that could cause weight gain. We'll reassess in 6-8 weeks after increased dose of dicyclomine.

## 2011-08-16 NOTE — Assessment & Plan Note (Signed)
He has persistent problems with his left upper quadrant and flank pain. Overall it is better to the response but has not totally relieved by dicyclomine. She remains anxious and concerned about other situations but she said CT scans in formalin in 2006 and a colonoscopy in 2008. She was a little heartburn-type times for that. At this point she was reassured and I'm going to increase the dicyclomine to 20 mg and have her take that twice a day sometime this rather than just trying to reschedule an appointment if she is having persistent problems. She is doing a little bit of weight and will try to lose that and more weight.

## 2011-08-23 ENCOUNTER — Ambulatory Visit: Payer: Medicare Other | Admitting: Internal Medicine

## 2011-09-24 ENCOUNTER — Encounter: Payer: Self-pay | Admitting: Internal Medicine

## 2011-09-24 ENCOUNTER — Ambulatory Visit (INDEPENDENT_AMBULATORY_CARE_PROVIDER_SITE_OTHER): Payer: Medicare Other | Admitting: Internal Medicine

## 2011-09-24 DIAGNOSIS — G8929 Other chronic pain: Secondary | ICD-10-CM

## 2011-09-24 DIAGNOSIS — E669 Obesity, unspecified: Secondary | ICD-10-CM

## 2011-09-24 DIAGNOSIS — K589 Irritable bowel syndrome without diarrhea: Secondary | ICD-10-CM

## 2011-09-24 DIAGNOSIS — R1012 Left upper quadrant pain: Secondary | ICD-10-CM

## 2011-09-24 NOTE — Assessment & Plan Note (Addendum)
DC dicyclomine - not helping Schedule EGD to evaluate - Risks and benefits discussed

## 2011-09-24 NOTE — Progress Notes (Signed)
  Subjective:    Patient ID: Frances Brown, female    DOB: 05-25-1946, 65 y.o.   MRN: [id number]  HPI This 65 year old woman was in early September with complaints of persistent left upper quadrant pain that I thought was probably an IBS problem. Dicyclomine was increased from 10 mg to 20 mg and and was taking it 2 times a day. It did not make a difference in the pain. She actually stopped taking it in the last one to 2 weeks because she developed regurgitation and reflux symptoms which she had not been having in the past. She says it still feels very wall in her left upper quadrant area. She was drinking a relatively weak of coffee but even reduced fat and the symptoms are a bit better. There is some associated nausea with this as well and food will relieve that symptom. She has no unintentional weight loss. There is no significant change in her bowel habits either. No vomiting. Raw vegetables often make the abdominal pain worse.   Review of Systems No fevers chills chest pain.    Objective:   Physical Exam Obese, NAD Lungs clear  Heart S1S2 no rmg Abd obese and mildly tender LUQ, no mass, BS+       Assessment & Plan:

## 2011-09-24 NOTE — Patient Instructions (Signed)
You have been scheduled for an Endoscopy with separate instructions given.  

## 2011-09-24 NOTE — Assessment & Plan Note (Signed)
Probably has a functional disturbance - will await EGD ? Functional dyspepsia Has not tried chronic PPI

## 2011-09-24 NOTE — Assessment & Plan Note (Signed)
She intends to restart weight watchers

## 2011-09-27 ENCOUNTER — Ambulatory Visit (AMBULATORY_SURGERY_CENTER): Payer: Medicare Other | Admitting: Internal Medicine

## 2011-09-27 ENCOUNTER — Encounter: Payer: Self-pay | Admitting: Internal Medicine

## 2011-09-27 VITALS — BP 148/91 | HR 67 | Temp 97.9°F | Resp 13 | Ht 64.0 in | Wt 184.0 lb

## 2011-09-27 DIAGNOSIS — K589 Irritable bowel syndrome without diarrhea: Secondary | ICD-10-CM

## 2011-09-27 DIAGNOSIS — R1012 Left upper quadrant pain: Secondary | ICD-10-CM

## 2011-09-27 HISTORY — PX: UPPER GASTROINTESTINAL ENDOSCOPY: SHX188

## 2011-09-27 MED ORDER — SODIUM CHLORIDE 0.9 % IV SOLN: 500.0000 mL | INTRAVENOUS | Status: DC

## 2011-09-27 NOTE — Patient Instructions (Addendum)
The esophagus, stomach and duodenum looked normal. i took a biopsy to test for infection that sometimes causes symptoms like yours. I will let you know the results and plans for treatment soon. Iva Boop, MD, [facility] Discharge instructions given with verbal understanding. Resume previous medications.

## 2011-09-28 ENCOUNTER — Telehealth: Payer: Self-pay | Admitting: *Deleted

## 2011-09-28 NOTE — Telephone Encounter (Signed)
Follow up Call- Patient questions:  Do you have a fever, pain , or abdominal swelling? yes Pain Score  3 *States has had it for 4 yrs. Have you tolerated food without any problems? yes  Have you been able to return to your normal activities? yes  Do you have any questions about your discharge instructions: Diet   no Medications  no Follow up visit  no  Do you have questions or concerns about your Care? no  Actions: * If pain score is 4 or above: No action needed, pain <4.

## 2011-10-01 DIAGNOSIS — R1012 Left upper quadrant pain: Secondary | ICD-10-CM

## 2011-10-01 DIAGNOSIS — R109 Unspecified abdominal pain: Secondary | ICD-10-CM

## 2011-10-01 LAB — HELICOBACTER PYLORI SCREEN-BIOPSY: UREASE: NEGATIVE

## 2011-10-01 NOTE — Progress Notes (Signed)
Quick Note:  Office  Let her know no H. Pylori Start Prilosec OTC (generic omeprazole 20 mg ok) 1 each day and take for 2 months. If that makes a difference and she is better - stay on it. If it does not help call back after 2 months.  [facility]  No letter No recall ______

## 2011-10-02 ENCOUNTER — Other Ambulatory Visit: Payer: Self-pay

## 2011-10-02 MED ORDER — OMEPRAZOLE 20 MG PO CPDR: 20.0000 mg | DELAYED_RELEASE_CAPSULE | Freq: Every day | ORAL | Status: DC

## 2011-12-24 ENCOUNTER — Encounter: Payer: Self-pay | Admitting: Cardiovascular Disease

## 2011-12-27 ENCOUNTER — Ambulatory Visit (INDEPENDENT_AMBULATORY_CARE_PROVIDER_SITE_OTHER): Payer: Medicare Other | Admitting: Cardiovascular Disease

## 2011-12-27 ENCOUNTER — Encounter: Payer: Self-pay | Admitting: Cardiovascular Disease

## 2011-12-27 VITALS — BP 137/69 | HR 66 | Ht 64.0 in | Wt 183.0 lb

## 2011-12-27 DIAGNOSIS — E785 Hyperlipidemia, unspecified: Secondary | ICD-10-CM | POA: Insufficient documentation

## 2011-12-27 DIAGNOSIS — I10 Essential (primary) hypertension: Secondary | ICD-10-CM

## 2011-12-27 DIAGNOSIS — R002 Palpitations: Secondary | ICD-10-CM

## 2011-12-27 NOTE — Assessment & Plan Note (Signed)
Continue statin. Followed in primary care. If she does not tolerate this statin, would consider Zetia.

## 2011-12-27 NOTE — Patient Instructions (Signed)
Your physician wants you to follow-up in: 12 months.  You will receive a reminder letter in the mail two months in advance. If you don't receive a letter, please call our office to schedule the follow-up appointment.  Your physician recommends that you continue on your current medications as directed. Please refer to the Current Medication list given to you today.   

## 2011-12-27 NOTE — Assessment & Plan Note (Signed)
BP well controlled.

## 2011-12-27 NOTE — Progress Notes (Signed)
History of Present Illness: 66 yo Frances Brown with history of HTN, HL and borderline DM here today for follow up. I saw her as a new pt 12/21/10 for evaluation of palpitations and leg pain. I follow her husband and she was a self referral. She told  me that she has been under much stress at home. She developed a rash on her right arm from presumed poison oak. She was put on Prednisone and received IV steroids. She could feel her heart racing. She followed up with a dermatologist and this was felt to be related to contact with poison oak. Her heart has been racing and skipping beats for several months. Worsened with use of prednisone. This occurs at least 3-4 times per week. She has no prior cardiac issues. No chest pain or SOB. She also describes cramps in her legs and she stopped her statin. The leg pain is much better off of the statin.  I arranged a 48 Holter monitor and an echo. Her echo showed normal LV size and function. Her Holter monitor showed occasional premature atrial contractions but no VT or atrial fibrillation.   She is here today for follow up. Her cholesterol medications have been adjusted by primary care. She is now on Pravastatin 80 mg QHS and her most recent lipid profile in January 2013 with total chol of 111, TG 88, HDL 35, LDL 58. She continues to have some palpitations but no prolonged episodes. No other complaints.   Her primary care is Dr. Juline Patch.   Past Medical History  Diagnosis Date  . Hypertension   . Hyperlipidemia   . Diverticulosis   . IBS (irritable bowel syndrome)   . Fibrocystic breast disease     Past Surgical History  Procedure Date  . Cholecystectomy   . Partial hysterectomy   . Wisdom tooth extraction   . Breast lymph node removed     right  . Shoulder surgery     right  . Knee arthroscopy     left  . Colonoscopy 06/2002, 04/10/2007    diverticulosis, colon polyps, external hemorrhoids  . Upper gastrointestinal endoscopy 09/27/11    Current  Outpatient Prescriptions  Medication Sig Dispense Refill  . bisoprolol-hydrochlorothiazide (ZIAC) 5-6.25 MG per tablet Take 1 tablet by mouth daily.        . Cholecalciferol (VITAMIN D3) 2000 UNITS TABS Take 1 capsule by mouth daily.        . Coenzyme Q10 50 MG CAPS Take 1 capsule by mouth daily.        Marland Kitchen Fe-Succ-C-Thre-B12-Des Stomach (CHROMAGEN PO) Take by mouth. 500 mg 1 tab daily      . Multiple Vitamins-Minerals (CENTRAL-VITE PO) Take 1 tablet by mouth daily.        . Multiple Vitamins-Minerals (PRESERVISION/LUTEIN) CAPS Take 2 capsules by mouth daily.       . pravastatin (PRAVACHOL) 80 MG tablet Take 80 mg by mouth daily.        No Known Allergies  History   Social History  . Marital Status: Married    Spouse Name: N/A    Number of Children: N/A  . Years of Education: N/A   Occupational History  . Not on file.   Social History Main Topics  . Smoking status: Never Smoker   . Smokeless tobacco: Never Used  . Alcohol Use: No  . Drug Use: No  . Sexually Active: Not on file   Other Topics Concern  . Not on file  Social History Narrative  . No narrative on file    Family History  Problem Relation Age of Onset  . Diabetes Father   . Heart failure Father   . Hypertension Mother   . Heart failure Mother   . Diabetes Mother   . Lung cancer Mother   . Stroke Mother   . Leukemia      grandfather  . Cirrhosis      grandmother  . Hypertension Sister   . Colon cancer Neg Hx     Review of Systems:  As stated in the HPI and otherwise negative.   BP 137/69  Pulse 66  Ht 5\' 4"  (1.626 m)  Wt 183 lb (83.008 kg)  BMI 31.41 kg/m2  Physical Examination: General: Well developed, well nourished, NAD HEENT: OP clear, mucus membranes moist SKIN: warm, dry. No rashes. Neuro: No focal deficits Musculoskeletal: Muscle strength 5/5 all ext Psychiatric: Mood and affect normal Neck: No JVD, no carotid bruits, no thyromegaly, no lymphadenopathy. Lungs:Clear bilaterally, no  wheezes, rhonci, crackles Cardiovascular: Regular rate and rhythm. No murmurs, gallops or rubs. Abdomen:Soft. Bowel sounds present. Non-tender.  Extremities: No lower extremity edema. Pulses are 2 + in the bilateral DP/PT.  EKG:NSR, rate 66 bpm.

## 2011-12-27 NOTE — Assessment & Plan Note (Signed)
Controlled. Only occur rarely. Likely from PACs. No further workup.

## 2012-05-02 ENCOUNTER — Telehealth: Payer: Self-pay | Admitting: Cardiovascular Disease

## 2012-05-02 NOTE — Telephone Encounter (Signed)
Pt want [place] to refer her to dr Debara Pickett for diabetes  [phone], pls  Call pt when done [phone]

## 2012-05-02 NOTE — Telephone Encounter (Signed)
Patient called, stated she would call her PCP to be referred to diabetic doctor Dr.Jeff Sharl Ma.

## 2012-05-19 ENCOUNTER — Other Ambulatory Visit: Payer: Self-pay | Admitting: Internal Medicine

## 2012-05-19 DIAGNOSIS — Z1231 Encounter for screening mammogram for malignant neoplasm of breast: Secondary | ICD-10-CM

## 2012-06-20 ENCOUNTER — Ambulatory Visit
Admission: RE | Admit: 2012-06-20 | Discharge: 2012-06-20 | Disposition: A | Discharge status: Home / Self Care | Payer: Medicare Other | Source: Ambulatory Visit | Attending: Internal Medicine | Admitting: Internal Medicine

## 2012-06-20 DIAGNOSIS — Z1231 Encounter for screening mammogram for malignant neoplasm of breast: Secondary | ICD-10-CM

## 2012-09-03 ENCOUNTER — Other Ambulatory Visit: Payer: Self-pay | Admitting: Internal Medicine

## 2012-09-03 DIAGNOSIS — R109 Unspecified abdominal pain: Secondary | ICD-10-CM

## 2012-09-08 ENCOUNTER — Ambulatory Visit
Admission: RE | Admit: 2012-09-08 | Discharge: 2012-09-08 | Disposition: A | Discharge status: Home / Self Care | Payer: Medicare Other | Source: Ambulatory Visit | Attending: Internal Medicine | Admitting: Internal Medicine

## 2012-09-08 DIAGNOSIS — R109 Unspecified abdominal pain: Secondary | ICD-10-CM

## 2012-09-08 MED ORDER — IOHEXOL 300 MG/ML  SOLN
100.0000 mL | Freq: Once | INTRAMUSCULAR | Status: AC | PRN
  Administered 2012-09-08: 100 mL via INTRAVENOUS

## 2013-01-08 ENCOUNTER — Encounter: Payer: Self-pay | Admitting: Cardiovascular Disease

## 2013-01-08 ENCOUNTER — Ambulatory Visit (INDEPENDENT_AMBULATORY_CARE_PROVIDER_SITE_OTHER): Payer: Medicare Other | Admitting: Cardiovascular Disease

## 2013-01-08 VITALS — BP 125/80 | HR 64 | Ht 64.0 in | Wt 181.0 lb

## 2013-01-08 DIAGNOSIS — R002 Palpitations: Secondary | ICD-10-CM

## 2013-01-08 DIAGNOSIS — I10 Essential (primary) hypertension: Secondary | ICD-10-CM

## 2013-01-08 NOTE — Patient Instructions (Addendum)
Your physician wants you to follow-up in:  12 months.  You will receive a reminder letter in the mail two months in advance. If you don't receive a letter, please call our office to schedule the follow-up appointment.   

## 2013-01-08 NOTE — Progress Notes (Signed)
History of Present Illness: 67 yo female with history of HTN, HL and borderline DM here today for follow up. I saw her as a new pt 12/21/10 for evaluation of palpitations and leg pain. I follow her husband and she was a self referral. She told me that she has been under much stress at home. She developed a rash on her right arm from presumed poison oak. She was put on Prednisone and received IV steroids. She could feel her heart racing. She followed up with a dermatologist and this was felt to be related to contact with poison oak. Her heart was been racing and skipping beats for several months. Worsened with use of prednisone. This occurs at least 3-4 times per week. She has no prior cardiac issues. No chest pain or SOB. She also describes cramps in her legs and she stopped her statin. The leg pain is much better off of the statin. I arranged a 48 Holter monitor and an echo. Her echo showed normal LV size and function. Her Holter monitor showed occasional premature atrial contractions but no VT or atrial fibrillation.   She is here today for follow up.  She is feeling well. No chest pain or SOB. Rare palpitations.   Primary Care Physician: Juline Patch  Last Lipid Profile: January 2014 with total chol of 195, TG 88, HDL 39, LDL 111. LFTs normal.   Past Medical History  Diagnosis Date  . Hypertension   . Hyperlipidemia   . Diverticulosis   . IBS (irritable bowel syndrome)   . Fibrocystic breast disease     Past Surgical History  Procedure Date  . Cholecystectomy   . Partial hysterectomy   . Wisdom tooth extraction   . Breast lymph node removed     right  . Shoulder surgery     right  . Knee arthroscopy     left  . Colonoscopy 06/2002, 04/10/2007    diverticulosis, colon polyps, external hemorrhoids  . Upper gastrointestinal endoscopy 09/27/11    Current Outpatient Prescriptions  Medication Sig Dispense Refill  . bisoprolol-hydrochlorothiazide (ZIAC) 5-6.25 MG per tablet Take 1  tablet by mouth daily.        . Cholecalciferol (VITAMIN D3) 2000 UNITS TABS Take 1 capsule by mouth daily.        . Coenzyme Q10 50 MG CAPS Take 1 capsule by mouth daily.        . metFORMIN (GLUCOPHAGE) 500 MG tablet Take 500 mg by mouth 2 (two) times daily with a meal.      . Multiple Vitamins-Minerals (CENTRAL-VITE PO) Take 1 tablet by mouth daily.        . Multiple Vitamins-Minerals (PRESERVISION/LUTEIN) CAPS Take 2 capsules by mouth daily.       . pravastatin (PRAVACHOL) 80 MG tablet Take 80 mg by mouth daily.        No Known Allergies  History   Social History  . Marital Status: Married    Spouse Name: N/A    Number of Children: N/A  . Years of Education: N/A   Occupational History  . Not on file.   Social History Main Topics  . Smoking status: Never Smoker   . Smokeless tobacco: Never Used  . Alcohol Use: No  . Drug Use: No  . Sexually Active: Not on file   Other Topics Concern  . Not on file   Social History Narrative  . No narrative on file    Family History  Problem Relation Age  of Onset  . Diabetes Father   . Heart failure Father   . Hypertension Mother   . Heart failure Mother   . Diabetes Mother   . Lung cancer Mother   . Stroke Mother   . Leukemia      grandfather  . Cirrhosis      grandmother  . Hypertension Sister   . Colon cancer Neg Hx     Review of Systems:  As stated in the HPI and otherwise negative.   BP 125/80  Pulse 64  Ht 5\' 4"  (1.626 m)  Wt 181 lb (82.101 kg)  BMI 31.07 kg/m2  Physical Examination: General: Well developed, well nourished, NAD HEENT: OP clear, mucus membranes moist SKIN: warm, dry. No rashes. Neuro: No focal deficits Musculoskeletal: Muscle strength 5/5 all ext Psychiatric: Mood and affect normal Neck: No JVD, no carotid bruits, no thyromegaly, no lymphadenopathy. Lungs:Clear bilaterally, no wheezes, rhonci, crackles Cardiovascular: Regular rate and rhythm. No murmurs, gallops or rubs. Abdomen:Soft. Bowel  sounds present. Non-tender.  Extremities: No lower extremity edema. Pulses are 2 + in the bilateral DP/PT.  EKG: NSR, rate 64 bpm.   Assessment and Plan:   1. PALPITATIONS Controlled. Only occur rarely. Likely from PACs. No further workup.   2. HTN: BP well controlled.   3. Hyperlipidemia:  Continue statin. Followed in primary care.

## 2013-05-13 ENCOUNTER — Other Ambulatory Visit: Payer: Self-pay

## 2013-05-13 DIAGNOSIS — Z1231 Encounter for screening mammogram for malignant neoplasm of breast: Secondary | ICD-10-CM

## 2013-06-23 ENCOUNTER — Ambulatory Visit
Admission: RE | Admit: 2013-06-23 | Discharge: 2013-06-23 | Disposition: A | Discharge status: Home / Self Care | Payer: Medicare Other | Source: Ambulatory Visit

## 2013-06-23 DIAGNOSIS — Z1231 Encounter for screening mammogram for malignant neoplasm of breast: Secondary | ICD-10-CM

## 2014-01-08 ENCOUNTER — Ambulatory Visit: Payer: Medicare Other | Admitting: Cardiovascular Disease

## 2014-01-26 ENCOUNTER — Ambulatory Visit: Payer: Medicare Other | Admitting: Emergency Medicine

## 2014-01-27 ENCOUNTER — Ambulatory Visit: Payer: Medicare Other | Admitting: Cardiovascular Disease

## 2014-01-28 ENCOUNTER — Encounter: Payer: Self-pay | Admitting: Emergency Medicine

## 2014-01-28 ENCOUNTER — Ambulatory Visit (INDEPENDENT_AMBULATORY_CARE_PROVIDER_SITE_OTHER): Payer: Medicare Other | Admitting: Emergency Medicine

## 2014-01-28 VITALS — BP 130/80 | HR 80 | Ht 64.0 in | Wt 180.0 lb

## 2014-01-28 DIAGNOSIS — R05 Cough: Secondary | ICD-10-CM

## 2014-01-28 DIAGNOSIS — R059 Cough, unspecified: Secondary | ICD-10-CM | POA: Insufficient documentation

## 2014-01-28 MED ORDER — FLUTICASONE PROPIONATE 50 MCG/ACT NA SUSP: 2.0000 | Freq: Two times a day (BID) | NASAL | Status: DC

## 2014-01-28 MED ORDER — OMEPRAZOLE 20 MG PO CPDR: 20.0000 mg | DELAYED_RELEASE_CAPSULE | Freq: Every day | ORAL | Status: DC

## 2014-01-28 MED ORDER — LORATADINE 10 MG PO TABS: 10.0000 mg | ORAL_TABLET | Freq: Every day | ORAL | Status: DC

## 2014-01-28 NOTE — Progress Notes (Signed)
Subjective:    Patient ID: Frances Brown, female    DOB: [birth year], 68 y.o.   MRN: [id number]  HPI 68 yo woman, never smoker, hx HTN, hyperlipidemia, IBS. She is a self referral for evaluation of a persistant cough. She describes onset of cough beginning September '14, sometimes productive of clear mucous, sometimes dry. She has some allergies and more mucous production as well. She did a 1 week trial of nasal steroid, then more recently has been tried on omeprazole > has been on this for a week. She A CXR was done by Dr Minna Antis in 12/2013 that was reported as clear. She has occasional GERD sx but not regularly. She is having head congestion but this has waxed and waned thorough the months. She feels a globus sensation, non-productive. Clears her throat frequently. No new meds.    Review of Systems  Constitutional: Negative for fever and unexpected weight change.  HENT: Negative for congestion, dental problem, ear pain, nosebleeds, postnasal drip, rhinorrhea, sinus pressure, sneezing, sore throat and trouble swallowing.   Eyes: Negative for redness and itching.  Respiratory: Positive for cough and chest tightness. Negative for shortness of breath and wheezing.   Cardiovascular: Negative for palpitations and leg swelling.  Gastrointestinal: Negative for nausea and vomiting.  Genitourinary: Negative for dysuria.  Musculoskeletal: Negative for joint swelling.  Skin: Negative for rash.  Neurological: Negative for headaches.  Hematological: Does not bruise/bleed easily.  Psychiatric/Behavioral: Negative for dysphoric mood. The patient is not nervous/anxious.     Past Medical History  Diagnosis Date  . Hypertension   . Hyperlipidemia   . Diverticulosis   . IBS (irritable bowel syndrome)   . Fibrocystic breast disease      Family History  Problem Relation Age of Onset  . Diabetes Father   . Heart failure Father   . Hypertension Mother   . Heart failure Mother   . Diabetes Mother   .  Lung cancer Mother   . Stroke Mother   . Leukemia      grandfather  . Cirrhosis      grandmother  . Hypertension Sister   . Colon cancer Neg Hx      History   Social History  . Marital Status: Married    Spouse Name: N/A    Number of Children: N/A  . Years of Education: N/A   Occupational History  . Not on file.   Social History Main Topics  . Smoking status: Never Smoker   . Smokeless tobacco: Never Used  . Alcohol Use: No  . Drug Use: No  . Sexual Activity: Not on file   Other Topics Concern  . Not on file   Social History Narrative  . No narrative on file    Allergies  Allergen Reactions  . Trimethoprim     Rash / hives that are painful     Outpatient Prescriptions Prior to Visit  Medication Sig Dispense Refill  . bisoprolol-hydrochlorothiazide (ZIAC) 5-6.25 MG per tablet Take 1 tablet by mouth daily.        . Cholecalciferol (VITAMIN D3) 2000 UNITS TABS Take 1 capsule by mouth daily.        . Coenzyme Q10 50 MG CAPS Take 1 capsule by mouth daily.        . Multiple Vitamins-Minerals (CENTRAL-VITE PO) Take 1 tablet by mouth daily.        . pravastatin (PRAVACHOL) 80 MG tablet Take 80 mg by mouth daily.      Marland Kitchen  metFORMIN (GLUCOPHAGE) 500 MG tablet Take 500 mg by mouth 2 (two) times daily with a meal.      . Multiple Vitamins-Minerals (PRESERVISION/LUTEIN) CAPS Take 2 capsules by mouth daily.        No facility-administered medications prior to visit.         Objective:   Physical Exam Filed Vitals:   01/28/14 1217  BP: 130/80  Pulse: 80  Height: 5\' 4"  (1.626 m)  Weight: 180 lb (81.647 kg)  SpO2: 97%   Gen: Pleasant, well-nourished, in no distress,  normal affect  ENT: No lesions,  mouth clear, posterior pharyngeal erythema and mucous   Neck: No JVD, no TMG, no carotid bruits  Lungs: No use of accessory muscles, clear without rales or rhonchi, some intermittent referred UA noise.   Cardiovascular: RRR, heart sounds normal, no murmur or  gallops, no peripheral edema  Musculoskeletal: No deformities, no cyanosis or clubbing  Neuro: alert, non focal  Skin: Warm, no lesions or rashes     Assessment & Plan:  Cough Suspect contributions of both PND and GERD. She has been given rx for both but has not been on these for a long period of time.  - will restart fluticasone - start loratadine - increase omeprazole to bid x 1 weeks then go to qd - rov 1 month - if cough persists then will perform spirometry, consider FOB for airway exam.

## 2014-01-28 NOTE — Assessment & Plan Note (Signed)
Suspect contributions of both PND and GERD. She has been given rx for both but has not been on these for a long period of time.  - will restart fluticasone - start loratadine - increase omeprazole to bid x 1 weeks then go to qd - rov 1 month - if cough persists then will perform spirometry, consider FOB for airway exam.

## 2014-01-28 NOTE — Patient Instructions (Signed)
Please restart your fluticasone nasal spray (Flonase) 2 sprays each nostril twice a day Start loratadine (Claritin) 10mg  daily Increase your omeprazole 20mg  to twice a day for 1 week, then go back to once a day.  Try to avoid clearing your throat if possible You would benefit from a weekend of voice rest.  You may use your cough syrup or benzonatate perles if needed to suppress the cough.  Follow with Dr Lamonte Sakai in 1 month

## 2014-02-01 ENCOUNTER — Encounter: Payer: Self-pay | Admitting: Cardiovascular Disease

## 2014-02-16 ENCOUNTER — Ambulatory Visit (INDEPENDENT_AMBULATORY_CARE_PROVIDER_SITE_OTHER): Payer: Medicare Other | Admitting: Cardiovascular Disease

## 2014-02-16 ENCOUNTER — Encounter: Payer: Self-pay | Admitting: Cardiovascular Disease

## 2014-02-16 VITALS — BP 126/78 | HR 63 | Ht 64.0 in | Wt 172.0 lb

## 2014-02-16 DIAGNOSIS — I10 Essential (primary) hypertension: Secondary | ICD-10-CM

## 2014-02-16 DIAGNOSIS — E785 Hyperlipidemia, unspecified: Secondary | ICD-10-CM

## 2014-02-16 DIAGNOSIS — R002 Palpitations: Secondary | ICD-10-CM

## 2014-02-16 NOTE — Patient Instructions (Signed)
Your physician wants you to follow-up in:  12 months.  You will receive a reminder letter in the mail two months in advance. If you don't receive a letter, please call our office to schedule the follow-up appointment.   

## 2014-02-16 NOTE — Progress Notes (Signed)
History of Present Illness: 68 yo female with history of HTN, HLD and borderline DM here today for follow up. I saw her as a new pt 12/21/10 for evaluation of palpitations and leg pain. I follow her husband and she was a self referral. She told me that she has been under much stress at home. She developed a rash on her right arm from presumed poison oak. She was put on Prednisone and received IV steroids. She could feel her heart racing. She followed up with a dermatologist and this was felt to be related to contact with poison oak. Her heart was been racing and skipping beats for several months. Worsened with use of prednisone. This occurred at least 3-4 times per week. She also describes cramps in her legs and she stopped her statin. The leg pain is much better off of the statin. I arranged a 48 Holter monitor and an echo. Her echo showed normal LV size and function. Her Holter monitor showed occasional premature atrial contractions but no VT or atrial fibrillation.   She is here today for follow up.  She is feeling well. No chest pain or SOB. Rare palpitations. She has had post-nasal drip and hoarseness, seen by pulmonary.   Primary Care Physician: Tommy Medal  Last Lipid Profile: January 2014 with total chol of 195, TG 88, HDL 39, LDL 111. LFTs normal.   Past Medical History  Diagnosis Date  . Hypertension   . Hyperlipidemia   . Diverticulosis   . IBS (irritable bowel syndrome)   . Fibrocystic breast disease     Past Surgical History  Procedure Laterality Date  . Cholecystectomy    . Partial hysterectomy    . Wisdom tooth extraction    . Breast lymph node removed      right  . Shoulder surgery      right  . Knee arthroscopy      left  . Colonoscopy  06/2002, 04/10/2007    diverticulosis, colon polyps, external hemorrhoids  . Upper gastrointestinal endoscopy  09/27/11    Current Outpatient Prescriptions  Medication Sig Dispense Refill  . bisoprolol (ZEBETA) 10 MG tablet Take  10 mg by mouth daily.      . Cholecalciferol (VITAMIN D3) 2000 UNITS TABS Take 1 capsule by mouth daily.        . Coenzyme Q10 50 MG CAPS Take 1 capsule by mouth daily.        . fluticasone (FLONASE) 50 MCG/ACT nasal spray Place 2 sprays into both nostrils as needed.      . loratadine (CLARITIN) 10 MG tablet Take 1 tablet (10 mg total) by mouth daily.  30 tablet  11  . Multiple Vitamins-Minerals (CENTRAL-VITE PO) Take 1 tablet by mouth daily.        Marland Kitchen omeprazole (PRILOSEC) 20 MG capsule Take 1 capsule (20 mg total) by mouth daily.  30 capsule  11  . pravastatin (PRAVACHOL) 80 MG tablet Take 80 mg by mouth daily.      . SitaGLIPtin-MetFORMIN HCl (JANUMET XR) [phone] MG TB24 Take 1 tablet by mouth daily. 1/2 tab daily       No current facility-administered medications for this visit.    Allergies  Allergen Reactions  . Trimethoprim     Rash / hives that are painful    History   Social History  . Marital Status: Married    Spouse Name: N/A    Number of Children: N/A  . Years of Education: N/A  Occupational History  . Not on file.   Social History Main Topics  . Smoking status: Never Smoker   . Smokeless tobacco: Never Used  . Alcohol Use: No  . Drug Use: No  . Sexual Activity: Not on file   Other Topics Concern  . Not on file   Social History Narrative  . No narrative on file    Family History  Problem Relation Age of Onset  . Diabetes Father   . Heart failure Father   . Hypertension Mother   . Heart failure Mother   . Diabetes Mother   . Lung cancer Mother   . Stroke Mother   . Leukemia      grandfather  . Cirrhosis      grandmother  . Hypertension Sister   . Colon cancer Neg Hx     Review of Systems:  As stated in the HPI and otherwise negative.   BP 126/78  Pulse 63  Ht 5\' 4"  (1.626 m)  Wt 172 lb (78.019 kg)  BMI 29.51 kg/m2  Physical Examination: General: Well developed, well nourished, NAD HEENT: OP clear, mucus membranes moist SKIN: warm,  dry. No rashes. Neuro: No focal deficits Musculoskeletal: Muscle strength 5/5 all ext Psychiatric: Mood and affect normal Neck: No JVD, no carotid bruits, no thyromegaly, no lymphadenopathy. Lungs:Clear bilaterally, no wheezes, rhonci, crackles Cardiovascular: Regular rate and rhythm. No murmurs, gallops or rubs. Abdomen:Soft. Bowel sounds present. Non-tender.  Extremities: No lower extremity edema. Pulses are 2 + in the bilateral DP/PT.  EKG: NSR, rate 63 bpm.   Assessment and Plan:   1. PALPITATIONS Controlled. Only occur rarely. Likely from PACs. Echo normal.   2. HTN: BP well controlled. No changes.   3. Hyperlipidemia:  Continue statin. Followed in primary care. Last LDL 100 02/01/14

## 2014-02-24 ENCOUNTER — Encounter: Payer: Self-pay | Admitting: Emergency Medicine

## 2014-02-24 ENCOUNTER — Ambulatory Visit (INDEPENDENT_AMBULATORY_CARE_PROVIDER_SITE_OTHER)
Admission: RE | Admit: 2014-02-24 | Discharge: 2014-02-24 | Disposition: A | Discharge status: Home / Self Care | Payer: Medicare Other | Source: Ambulatory Visit | Attending: Emergency Medicine | Admitting: Emergency Medicine

## 2014-02-24 ENCOUNTER — Ambulatory Visit (INDEPENDENT_AMBULATORY_CARE_PROVIDER_SITE_OTHER): Payer: Medicare Other | Admitting: Emergency Medicine

## 2014-02-24 VITALS — BP 140/88 | HR 76 | Ht 64.0 in | Wt 173.0 lb

## 2014-02-24 DIAGNOSIS — R059 Cough, unspecified: Secondary | ICD-10-CM

## 2014-02-24 DIAGNOSIS — R05 Cough: Secondary | ICD-10-CM

## 2014-02-24 NOTE — Patient Instructions (Signed)
We will start nasal saline washes daily Continue your loratadine and fluticasone nasal spray Continue your omeprazole daily Try using over-the-counter decongestants that contain chlorpheniramine as directed.  CXR today Follow with Dr Lamonte Sakai in 6 weeks

## 2014-02-24 NOTE — Assessment & Plan Note (Signed)
Improved cough but still w some hoarseness. Suspect we have impacted but not resolved allergies. GERD remains treated with omeprazole.  - she is concerned about possible lung CA although low risk, will repeat her CXR - continue loratadine, fluticasone, add chlorpheniramine and [place]'s - continue omeprazole.  -  Defer PFT's for now

## 2014-02-24 NOTE — Progress Notes (Signed)
   Subjective:    Patient ID: Frances Brown, female    DOB: 04-May-1946, 68 y.o.   MRN: [id number]  Cough Pertinent negatives include no ear pain, eye redness, fever, headaches, postnasal drip, rash, rhinorrhea, sore throat, shortness of breath or wheezing.   68 yo woman, never smoker, hx HTN, hyperlipidemia, IBS. She is a self referral for evaluation of a persistant cough. She describes onset of cough beginning September '14, sometimes productive of clear mucous, sometimes dry. She has some allergies and more mucous production as well. She did a 1 week trial of nasal steroid, then more recently has been tried on omeprazole > has been on this for a week. She A CXR was done by Dr Minna Antis in 12/2013 that was reported as clear. She has occasional GERD sx but not regularly. She is having head congestion but this has waxed and waned thorough the months. She feels a globus sensation, non-productive. Clears her throat frequently. No new meds.   ROV 02/24/14 -- follows up for cough in setting allergies, occasional GERD. We restarted fluticasone + loratadine, increased omeprazole temporarily. Returns today. She is coughing less, but still has hoarse voice and feels need to clear her throat. It bothers her the most after she has been outside.    Review of Systems  Constitutional: Negative for fever and unexpected weight change.  HENT: Negative for congestion, dental problem, ear pain, nosebleeds, postnasal drip, rhinorrhea, sinus pressure, sneezing, sore throat and trouble swallowing.   Eyes: Negative for redness and itching.  Respiratory: Positive for cough and chest tightness. Negative for shortness of breath and wheezing.   Cardiovascular: Negative for palpitations and leg swelling.  Gastrointestinal: Negative for nausea and vomiting.  Genitourinary: Negative for dysuria.  Musculoskeletal: Negative for joint swelling.  Skin: Negative for rash.  Neurological: Negative for headaches.  Hematological: Does  not bruise/bleed easily.  Psychiatric/Behavioral: Negative for dysphoric mood. The patient is not nervous/anxious.        Objective:   Physical Exam Filed Vitals:   02/24/14 0908  BP: 140/88  Pulse: 76  Height: 5\' 4"  (1.626 m)  Weight: 173 lb (78.472 kg)  SpO2: 97%   Gen: Pleasant, well-nourished, in no distress,  normal affect  ENT: No lesions,  mouth clear, posterior pharyngeal erythema and mucous   Neck: No JVD, no TMG, no carotid bruits  Lungs: No use of accessory muscles, clear without rales or rhonchi, some intermittent referred UA noise.   Cardiovascular: RRR, heart sounds normal, no murmur or gallops, no peripheral edema  Musculoskeletal: No deformities, no cyanosis or clubbing  Neuro: alert, non focal  Skin: Warm, no lesions or rashes     Assessment & Plan:  Cough Improved cough but still w some hoarseness. Suspect we have impacted but not resolved allergies. GERD remains treated with omeprazole.  - she is concerned about possible lung CA although low risk, will repeat her CXR - continue loratadine, fluticasone, add chlorpheniramine and NSW's - continue omeprazole.  -  Defer PFT's for now

## 2014-03-01 ENCOUNTER — Telehealth: Payer: Self-pay | Admitting: Emergency Medicine

## 2014-03-01 NOTE — Telephone Encounter (Signed)
Pt calling requesting cxr results, they are in your box. Please advise RB, thanks!!!

## 2014-03-02 NOTE — Telephone Encounter (Signed)
Advised pt of cxr result per RB.  Pt verbalized understanding. Nothing further needed

## 2014-03-02 NOTE — Telephone Encounter (Signed)
Please tell the patient that her CXR is normal.

## 2014-03-23 ENCOUNTER — Telehealth: Payer: Self-pay | Admitting: Emergency Medicine

## 2014-03-23 NOTE — Telephone Encounter (Signed)
Last OV 02-24-14. i spoke with the pt and she states x 5 days she has been having increased productive cough with dark yellow phlegm, fever, headache. Pt denies any SOB, chest tightness, wheezing. She states she is doing sinus rinses, flonase, loratadine, omeprazole, and decongestant as directed. Please advise. [place] [facility], [place] [facility] [place].   Allergies  Allergen Reactions  . Trimethoprim     Rash / hives that are painful

## 2014-03-23 NOTE — Telephone Encounter (Signed)
Order doxycycline 100 bid x 7 days

## 2014-03-24 MED ORDER — DOXYCYCLINE HYCLATE 100 MG PO TABS: 100.0000 mg | ORAL_TABLET | Freq: Two times a day (BID) | ORAL | Status: DC

## 2014-03-24 NOTE — Telephone Encounter (Signed)
Spoke with pt and advised her rx for antibiotics was sent to pharm She verbalized understanding and nothing further needed

## 2014-04-02 ENCOUNTER — Ambulatory Visit (INDEPENDENT_AMBULATORY_CARE_PROVIDER_SITE_OTHER): Payer: Medicare Other | Admitting: Emergency Medicine

## 2014-04-02 ENCOUNTER — Encounter (INDEPENDENT_AMBULATORY_CARE_PROVIDER_SITE_OTHER): Payer: Self-pay

## 2014-04-02 ENCOUNTER — Encounter: Payer: Self-pay | Admitting: Emergency Medicine

## 2014-04-02 VITALS — BP 140/86 | HR 70 | Ht 64.0 in | Wt 174.2 lb

## 2014-04-02 DIAGNOSIS — R059 Cough, unspecified: Secondary | ICD-10-CM

## 2014-04-02 DIAGNOSIS — R05 Cough: Secondary | ICD-10-CM

## 2014-04-02 NOTE — Patient Instructions (Signed)
Continue your nasal saline washes.  Restart your loratadine and fluticasone nasal spray After 2 weeks, if you continue to have some dry cough, then restart your omeprazole 20mg  daily also.  Follow with Dr Lamonte Sakai in 6 months or sooner if you have any problems

## 2014-04-02 NOTE — Assessment & Plan Note (Signed)
She is improved following acute bronchitis. Ironically she stopped her loratadine, nasal steroid and omeprazole when she was sick. She now has her mild dry cough. Explained the roles that allergies and GERD can play here. Will add back rx in stepwise fashion to see what helps her the most > allergy regimen and then omeprazole if continues to have symptoms.

## 2014-04-02 NOTE — Progress Notes (Signed)
   Subjective:    Patient ID: Frances Brown, female    DOB: 1946-03-08, 68 y.o.   MRN: [id number]  Cough Pertinent negatives include no ear pain, eye redness, fever, headaches, postnasal drip, rash, rhinorrhea, sore throat, shortness of breath or wheezing.   68 yo woman, never smoker, hx HTN, hyperlipidemia, IBS. She is a self referral for evaluation of a persistant cough. She describes onset of cough beginning September '14, sometimes productive of clear mucous, sometimes dry. She has some allergies and more mucous production as well. She did a 1 week trial of nasal steroid, then more recently has been tried on omeprazole > has been on this for a week. She A CXR was done by Dr Minna Antis in 12/2013 that was reported as clear. She has occasional GERD sx but not regularly. She is having head congestion but this has waxed and waned thorough the months. She feels a globus sensation, non-productive. Clears her throat frequently. No new meds.   ROV 02/24/14 -- follows up for cough in setting allergies, occasional GERD. We restarted fluticasone + loratadine, increased omeprazole temporarily. Returns today. She is coughing less, but still has hoarse voice and feels need to clear her throat. It bothers her the most after she has been outside.   ROV 04/02/14 -- follows for cough, has hx allergies and GERD. We recently treated her for acute bronchitis with doxycycline. She has improved - no more fever or yellow sputum. She is on [place] but temporarily stopped her loratadine and omeprazole, doesn't feel any overt reflux.    Review of Systems  Constitutional: Negative for fever and unexpected weight change.  HENT: Negative for congestion, dental problem, ear pain, nosebleeds, postnasal drip, rhinorrhea, sinus pressure, sneezing, sore throat and trouble swallowing.   Eyes: Negative for redness and itching.  Respiratory: Positive for cough and chest tightness. Negative for shortness of breath and wheezing.    Cardiovascular: Negative for palpitations and leg swelling.  Gastrointestinal: Negative for nausea and vomiting.  Genitourinary: Negative for dysuria.  Musculoskeletal: Negative for joint swelling.  Skin: Negative for rash.  Neurological: Negative for headaches.  Hematological: Does not bruise/bleed easily.  Psychiatric/Behavioral: Negative for dysphoric mood. The patient is not nervous/anxious.        Objective:   Physical Exam Filed Vitals:   04/02/14 1331  BP: 140/86  Pulse: 70  Height: 5\' 4"  (1.626 m)  Weight: 174 lb 3.2 oz (79.017 kg)  SpO2: 98%   Gen: Pleasant, well-nourished, in no distress,  normal affect  ENT: No lesions,  mouth clear, posterior pharyngeal erythema and mucous   Neck: No JVD, no TMG, no carotid bruits  Lungs: No use of accessory muscles, clear without rales or rhonchi, some intermittent referred UA noise.   Cardiovascular: RRR, heart sounds normal, no murmur or gallops, no peripheral edema  Musculoskeletal: No deformities, no cyanosis or clubbing  Neuro: alert, non focal  Skin: Warm, no lesions or rashes     Assessment & Plan:  Cough She is improved following acute bronchitis. Ironically she stopped her loratadine, nasal steroid and omeprazole when she was sick. She now has her mild dry cough. Explained the roles that allergies and GERD can play here. Will add back rx in stepwise fashion to see what helps her the most > allergy regimen and then omeprazole if continues to have symptoms.

## 2014-04-08 ENCOUNTER — Telehealth: Payer: Self-pay | Admitting: Emergency Medicine

## 2014-04-08 DIAGNOSIS — R05 Cough: Secondary | ICD-10-CM

## 2014-04-08 DIAGNOSIS — R059 Cough, unspecified: Secondary | ICD-10-CM

## 2014-04-08 MED ORDER — OMEPRAZOLE 20 MG PO CPDR: 20.0000 mg | DELAYED_RELEASE_CAPSULE | Freq: Every day | ORAL | Status: DC

## 2014-04-08 NOTE — Telephone Encounter (Signed)
Spoke with pt. She needed RX for omperazole sent to [facility]. I have done so. Nothing furthr needed

## 2014-10-01 ENCOUNTER — Encounter: Payer: Self-pay | Admitting: Emergency Medicine

## 2014-10-01 ENCOUNTER — Ambulatory Visit (INDEPENDENT_AMBULATORY_CARE_PROVIDER_SITE_OTHER): Payer: Medicare Other | Admitting: Emergency Medicine

## 2014-10-01 VITALS — BP 138/90 | HR 71 | Temp 97.9°F | Ht 64.0 in | Wt 179.8 lb

## 2014-10-01 DIAGNOSIS — Z23 Encounter for immunization: Secondary | ICD-10-CM

## 2014-10-01 DIAGNOSIS — R059 Cough, unspecified: Secondary | ICD-10-CM

## 2014-10-01 DIAGNOSIS — R05 Cough: Secondary | ICD-10-CM

## 2014-10-01 NOTE — Patient Instructions (Addendum)
Please continue your omeprazole 40mg  daily Use you loratadine (Claritin) as needed Flu shot today Follow with Dr Lamonte Sakai in 1 year or sooner if needed

## 2014-10-01 NOTE — Progress Notes (Signed)
   Subjective:    Patient ID: Frances Brown, female    DOB: May 02, 1946, 68 y.o.   MRN: [id number]  Cough Pertinent negatives include no ear pain, eye redness, fever, headaches, postnasal drip, rash, rhinorrhea, sore throat, shortness of breath or wheezing.   68 yo woman, never smoker, hx HTN, hyperlipidemia, IBS. She is a self referral for evaluation of a persistant cough. She describes onset of cough beginning September '14, sometimes productive of clear mucous, sometimes dry. She has some allergies and more mucous production as well. She did a 1 week trial of nasal steroid, then more recently has been tried on omeprazole > has been on this for a week. She A CXR was done by Dr Minna Antis in 12/2013 that was reported as clear. She has occasional GERD sx but not regularly. She is having head congestion but this has waxed and waned thorough the months. She feels a globus sensation, non-productive. Clears her throat frequently. No new meds.   ROV 02/24/14 -- follows up for cough in setting allergies, occasional GERD. We restarted fluticasone + loratadine, increased omeprazole temporarily. Returns today. She is coughing less, but still has hoarse voice and feels need to clear her throat. It bothers her the most after she has been outside.   ROV 04/02/14 -- follows for cough, has hx allergies and GERD. We recently treated her for acute bronchitis with doxycycline. She has improved - no more fever or yellow sputum. She is on [place] but temporarily stopped her loratadine and omeprazole, doesn't feel any overt reflux.   ROV 10/01/14 -- follows for cough, has hx allergies and GERD. She has been doing fairly well, she had a recent episode of reflux that resulted in mucous, some UA irritation. She is taking omeprazole 40mg  daily, loratadine prn.    Review of Systems  Constitutional: Negative for fever and unexpected weight change.  HENT: Negative for congestion, dental problem, ear pain, nosebleeds, postnasal drip,  rhinorrhea, sinus pressure, sneezing, sore throat and trouble swallowing.   Eyes: Negative for redness and itching.  Respiratory: Negative for cough, chest tightness, shortness of breath and wheezing.   Cardiovascular: Negative for palpitations and leg swelling.  Gastrointestinal: Negative for nausea and vomiting.  Genitourinary: Negative for dysuria.  Musculoskeletal: Negative for joint swelling.  Skin: Negative for rash.  Neurological: Negative for headaches.  Hematological: Does not bruise/bleed easily.  Psychiatric/Behavioral: Negative for dysphoric mood. The patient is not nervous/anxious.        Objective:   Physical Exam Filed Vitals:   10/01/14 1346  BP: 138/90  Pulse: 71  Temp: 97.9 F (36.6 C)  TempSrc: Oral  Height: 5\' 4"  (1.626 m)  Weight: 179 lb 12.8 oz (81.557 kg)  SpO2: 98%   Gen: Pleasant, well-nourished, in no distress,  normal affect  ENT: No lesions,  mouth clear, posterior pharyngeal erythema and mucous   Neck: No JVD, no TMG, no carotid bruits  Lungs: No use of accessory muscles, clear without rales or rhonchi,    Cardiovascular: RRR, heart sounds normal, no murmur or gallops, no peripheral edema  Musculoskeletal: No deformities, no cyanosis or clubbing  Neuro: alert, non focal  Skin: Warm, no lesions or rashes     Assessment & Plan:  Cough Stable at this time.  - continue omeprazole and loratadine as ordered - rov 1 year

## 2014-10-01 NOTE — Assessment & Plan Note (Signed)
Stable at this time.  - continue omeprazole and loratadine as ordered - rov 1 year

## 2015-01-25 ENCOUNTER — Ambulatory Visit (INDEPENDENT_AMBULATORY_CARE_PROVIDER_SITE_OTHER): Payer: Medicare Other | Admitting: Cardiovascular Disease

## 2015-01-25 ENCOUNTER — Encounter: Payer: Self-pay | Admitting: Cardiovascular Disease

## 2015-01-25 VITALS — BP 128/88 | HR 73 | Ht 64.0 in | Wt 183.0 lb

## 2015-01-25 DIAGNOSIS — R002 Palpitations: Secondary | ICD-10-CM

## 2015-01-25 DIAGNOSIS — I10 Essential (primary) hypertension: Secondary | ICD-10-CM

## 2015-01-25 DIAGNOSIS — E785 Hyperlipidemia, unspecified: Secondary | ICD-10-CM

## 2015-01-25 NOTE — Patient Instructions (Signed)
Your physician wants you to follow-up in:  12 months.  You will receive a reminder letter in the mail two months in advance. If you don't receive a letter, please call our office to schedule the follow-up appointment.   

## 2015-01-25 NOTE — Progress Notes (Signed)
History of Present Illness: 69 yo female with history of HTN, HLD, PACs/palpitations and borderline DM here today for follow up. I saw her as a new pt 12/21/10 for evaluation of palpitations and leg pain. She told me that she has been under much stress at home. She developed a rash on her right arm from presumed poison oak. She was put on Prednisone and received IV steroids. She could feel her heart racing. She followed up with a dermatologist and this was felt to be related to contact with poison oak. I arranged a 48 Holter monitor and an echo. Her echo showed normal LV size and function. Her Holter monitor showed occasional premature atrial contractions but no VT or atrial fibrillation.   She is here today for follow up.  She is feeling well. No chest pain or SOB. No palpitations.   Primary Care Physician: Tommy Medal  Last Lipid Profile: January 2014 with total chol of 195, TG 88, HDL 39, LDL 111. LFTs normal.   Past Medical History  Diagnosis Date  . Hypertension   . Hyperlipidemia   . Diverticulosis   . IBS (irritable bowel syndrome)   . Fibrocystic breast disease     Past Surgical History  Procedure Laterality Date  . Cholecystectomy    . Partial hysterectomy    . Wisdom tooth extraction    . Breast lymph node removed      right  . Shoulder surgery      right  . Knee arthroscopy      left  . Colonoscopy  06/2002, 04/10/2007    diverticulosis, colon polyps, external hemorrhoids  . Upper gastrointestinal endoscopy  09/27/11    Current Outpatient Prescriptions  Medication Sig Dispense Refill  . bisoprolol (ZEBETA) 10 MG tablet Take 10 mg by mouth daily.    . Cholecalciferol (VITAMIN D3) 2000 UNITS TABS Take 1 capsule by mouth every other day.     . Coenzyme Q10 50 MG CAPS Take 1 capsule by mouth daily.      Marland Kitchen loratadine (CLARITIN) 10 MG tablet Take 10 mg by mouth daily as needed.    Marland Kitchen losartan (COZAAR) 25 MG tablet Take 25 mg by mouth daily.    . Multiple  Vitamins-Minerals (PRESERVISION AREDS 2 PO) Take 2 tablets by mouth daily.    . Omega-3 Fatty Acids (OMEGA-3 FISH OIL PO) Take 1 tablet by mouth daily.    Marland Kitchen omeprazole (PRILOSEC) 20 MG capsule Take 1 capsule (20 mg total) by mouth daily. (Patient taking differently: Take 40 mg by mouth daily. ) 90 capsule 3  . pravastatin (PRAVACHOL) 80 MG tablet Take 80 mg by mouth daily.    . SitaGLIPtin-MetFORMIN HCl (JANUMET XR) [phone] MG TB24 Take 1 tablet by mouth daily. 1/2 tab daily     No current facility-administered medications for this visit.    Allergies  Allergen Reactions  . Trimethoprim     Rash / hives that are painful    History   Social History  . Marital Status: Married    Spouse Name: N/A  . Number of Children: N/A  . Years of Education: N/A   Occupational History  . Not on file.   Social History Main Topics  . Smoking status: Never Smoker   . Smokeless tobacco: Never Used  . Alcohol Use: No  . Drug Use: No  . Sexual Activity: Not on file   Other Topics Concern  . Not on file   Social History Narrative  Family History  Problem Relation Age of Onset  . Diabetes Father   . Heart failure Father   . Hypertension Mother   . Heart failure Mother   . Diabetes Mother   . Lung cancer Mother   . Stroke Mother   . Leukemia      grandfather  . Cirrhosis      grandmother  . Hypertension Sister   . Colon cancer Neg Hx     Review of Systems:  As stated in the HPI and otherwise negative.   BP 128/88 mmHg  Pulse 73  Ht 5\' 4"  (1.626 m)  Wt 183 lb (83.008 kg)  BMI 31.40 kg/m2  Physical Examination: General: Well developed, well nourished, NAD HEENT: OP clear, mucus membranes moist SKIN: warm, dry. No rashes. Neuro: No focal deficits Musculoskeletal: Muscle strength 5/5 all ext Psychiatric: Mood and affect normal Neck: No JVD, no carotid bruits, no thyromegaly, no lymphadenopathy. Lungs:Clear bilaterally, no wheezes, rhonci, crackles Cardiovascular:  Regular rate and rhythm. No murmurs, gallops or rubs. Abdomen:Soft. Bowel sounds present. Non-tender.  Extremities: No lower extremity edema. Pulses are 2 + in the bilateral DP/PT.  EKG: NSR, rate 73 bpm.   Assessment and Plan:   1. PALPITATIONS Controlled. Only occur rarely. Likely from PACs. Echo normal.   2. HTN: BP well controlled. No changes.   3. Hyperlipidemia:  Continue statin. Followed in primary care. Last LDL 97 January 2016.

## 2015-02-10 ENCOUNTER — Encounter: Payer: Self-pay | Admitting: Emergency Medicine

## 2015-02-10 ENCOUNTER — Ambulatory Visit (INDEPENDENT_AMBULATORY_CARE_PROVIDER_SITE_OTHER): Payer: Medicare Other | Admitting: Emergency Medicine

## 2015-02-10 VITALS — BP 130/82 | HR 74 | Ht 64.0 in | Wt 185.0 lb

## 2015-02-10 DIAGNOSIS — R05 Cough: Secondary | ICD-10-CM

## 2015-02-10 DIAGNOSIS — R059 Cough, unspecified: Secondary | ICD-10-CM

## 2015-02-10 NOTE — Patient Instructions (Addendum)
We will perform a CT scan of your chest  We will perform full breathing tests We will arrange for a bronchoscopy to look at your airways  Please temporarily stop you losartan for 2 weeks to see if you cough changes. Keep track of any changes and call us before going back on the medications.  Follow with Dr Lamonte Sakai in 6 weeks or sooner if you have any problems

## 2015-02-10 NOTE — Assessment & Plan Note (Signed)
Persists despite treatment of GERD and allergic rhinitis. I still suspect that she has upper airway irritation syndrome but she needs more since workup for cough at this point  We will perform a CT scan of your chest  We will perform full breathing tests We will arrange for a bronchoscopy to look at your airways  Please temporarily stop you losartan for 2 weeks to see if you cough changes. Keep track of any changes and call us before going back on the medications.  Follow with Dr Lamonte Sakai in 6 weeks or sooner if you have any problems

## 2015-02-10 NOTE — Progress Notes (Signed)
Subjective:    Patient ID: Frances Brown, female    DOB: 12-Oct-1946, 69 y.o.   MRN: [id number]  Cough Pertinent negatives include no ear pain, eye redness, fever, headaches, postnasal drip, rash, rhinorrhea, sore throat, shortness of breath or wheezing.   69 yo woman, never smoker, hx HTN, hyperlipidemia, IBS. She is a self referral for evaluation of a persistant cough. She describes onset of cough beginning September '14, sometimes productive of clear mucous, sometimes dry. She has some allergies and more mucous production as well. She did a 1 week trial of nasal steroid, then more recently has been tried on omeprazole > has been on this for a week. She A CXR was done by Dr Minna Antis in 12/2013 that was reported as clear. She has occasional GERD sx but not regularly. She is having head congestion but this has waxed and waned thorough the months. She feels a globus sensation, non-productive. Clears her throat frequently. No new meds.   ROV 02/24/14 -- follows up for cough in setting allergies, occasional GERD. We restarted fluticasone + loratadine, increased omeprazole temporarily. Returns today. She is coughing less, but still has hoarse voice and feels need to clear her throat. It bothers her the most after she has been outside.   ROV 04/02/14 -- follows for cough, has hx allergies and GERD. We recently treated her for acute bronchitis with doxycycline. She has improved - no more fever or yellow sputum. She is on [place] but temporarily stopped her loratadine and omeprazole, doesn't feel any overt reflux.   ROV 10/01/14 -- follows for cough, has hx allergies and GERD. She has been doing fairly well, she had a recent episode of reflux that resulted in mucous, some UA irritation. She is taking omeprazole 40mg  daily, loratadine prn.   ROV 02/10/15 -- follow-up visit for chronic cough, allergic rhinitis, hoarseness. She also has occasional GERD.  She has been taking omeprazole and loratadine as ordered. Her cough  has been worse, continues to be bad at night, has paroxysms.  She is bringing up mucous, often productive of yellow dark in the am's. She is on losartan. Has rare GERD sx.    Review of Systems  Constitutional: Negative for fever and unexpected weight change.  HENT: Negative for congestion, dental problem, ear pain, nosebleeds, postnasal drip, rhinorrhea, sinus pressure, sneezing, sore throat and trouble swallowing.   Eyes: Negative for redness and itching.  Respiratory: Negative for cough, chest tightness, shortness of breath and wheezing.   Cardiovascular: Negative for palpitations and leg swelling.  Gastrointestinal: Negative for nausea and vomiting.  Genitourinary: Negative for dysuria.  Musculoskeletal: Negative for joint swelling.  Skin: Negative for rash.  Neurological: Negative for headaches.  Hematological: Does not bruise/bleed easily.  Psychiatric/Behavioral: Negative for dysphoric mood. The patient is not nervous/anxious.        Objective:   Physical Exam Filed Vitals:   02/10/15 1551  BP: 130/82  Pulse: 74  Height: 5\' 4"  (1.626 m)  Weight: 185 lb (83.915 kg)  SpO2: 95%   Gen: Pleasant, well-nourished, in no distress,  normal affect  ENT: No lesions,  mouth clear, posterior pharyngeal erythema and mucous   Neck: No JVD, no TMG, no carotid bruits  Lungs: No use of accessory muscles, clear without rales or rhonchi,    Cardiovascular: RRR, heart sounds normal, no murmur or gallops, no peripheral edema  Musculoskeletal: No deformities, no cyanosis or clubbing  Neuro: alert, non focal  Skin: Warm, no lesions or rashes  Assessment & Plan:  Cough Persists despite treatment of GERD and allergic rhinitis. I still suspect that she has upper airway irritation syndrome but she needs more since workup for cough at this point  We will perform a CT scan of your chest  We will perform full breathing tests We will arrange for a bronchoscopy to look at your airways   Please temporarily stop you losartan for 2 weeks to see if you cough changes. Keep track of any changes and call us before going back on the medications.  Follow with Dr Lamonte Sakai in 6 weeks or sooner if you have any problems

## 2015-02-11 ENCOUNTER — Ambulatory Visit (INDEPENDENT_AMBULATORY_CARE_PROVIDER_SITE_OTHER)
Admission: RE | Admit: 2015-02-11 | Discharge: 2015-02-11 | Disposition: A | Discharge status: Home / Self Care | Payer: Medicare Other | Source: Ambulatory Visit | Attending: Emergency Medicine | Admitting: Emergency Medicine

## 2015-02-11 DIAGNOSIS — R05 Cough: Secondary | ICD-10-CM

## 2015-02-11 DIAGNOSIS — R059 Cough, unspecified: Secondary | ICD-10-CM

## 2015-02-14 ENCOUNTER — Ambulatory Visit (HOSPITAL_COMMUNITY)
Admission: RE | Admit: 2015-02-14 | Discharge: 2015-02-14 | Disposition: A | Discharge status: Home / Self Care | Payer: Medicare Other | Source: Ambulatory Visit | Attending: Emergency Medicine | Admitting: Emergency Medicine

## 2015-02-14 ENCOUNTER — Encounter (HOSPITAL_COMMUNITY)
Admission: RE | Disposition: A | Discharge status: Home / Self Care | Payer: Self-pay | Source: Ambulatory Visit | Attending: Emergency Medicine

## 2015-02-14 ENCOUNTER — Encounter (HOSPITAL_COMMUNITY): Payer: Self-pay | Admitting: Respiratory Therapy

## 2015-02-14 DIAGNOSIS — J309 Allergic rhinitis, unspecified: Secondary | ICD-10-CM | POA: Insufficient documentation

## 2015-02-14 DIAGNOSIS — E785 Hyperlipidemia, unspecified: Secondary | ICD-10-CM | POA: Insufficient documentation

## 2015-02-14 DIAGNOSIS — J984 Other disorders of lung: Secondary | ICD-10-CM | POA: Diagnosis not present

## 2015-02-14 DIAGNOSIS — K589 Irritable bowel syndrome without diarrhea: Secondary | ICD-10-CM | POA: Insufficient documentation

## 2015-02-14 DIAGNOSIS — K219 Gastro-esophageal reflux disease without esophagitis: Secondary | ICD-10-CM | POA: Insufficient documentation

## 2015-02-14 DIAGNOSIS — R059 Cough, unspecified: Secondary | ICD-10-CM

## 2015-02-14 DIAGNOSIS — I10 Essential (primary) hypertension: Secondary | ICD-10-CM | POA: Insufficient documentation

## 2015-02-14 DIAGNOSIS — J42 Unspecified chronic bronchitis: Secondary | ICD-10-CM | POA: Diagnosis not present

## 2015-02-14 DIAGNOSIS — J189 Pneumonia, unspecified organism: Secondary | ICD-10-CM | POA: Insufficient documentation

## 2015-02-14 DIAGNOSIS — R05 Cough: Secondary | ICD-10-CM

## 2015-02-14 DIAGNOSIS — Z79899 Other long term (current) drug therapy: Secondary | ICD-10-CM | POA: Diagnosis not present

## 2015-02-14 DIAGNOSIS — J209 Acute bronchitis, unspecified: Secondary | ICD-10-CM | POA: Insufficient documentation

## 2015-02-14 HISTORY — PX: VIDEO BRONCHOSCOPY: SHX5072

## 2015-02-14 LAB — GLUCOSE, CAPILLARY: Glucose-Capillary: 117 mg/dL — ABNORMAL HIGH (ref 70–99)

## 2015-02-14 SURGERY — VIDEO BRONCHOSCOPY WITHOUT FLUORO
Anesthesia: Moderate Sedation | Laterality: Bilateral

## 2015-02-14 MED ORDER — LIDOCAINE HCL 2 % EX GEL: 1.0000 "application " | Freq: Once | CUTANEOUS | Status: DC

## 2015-02-14 MED ORDER — SODIUM CHLORIDE 0.9 % IV SOLN
INTRAVENOUS | Status: DC
  Administered 2015-02-14: 11:00:00 via INTRAVENOUS

## 2015-02-14 MED ORDER — PHENYLEPHRINE HCL 0.25 % NA SOLN
NASAL | Status: DC | PRN
  Administered 2015-02-14: 2 via NASAL

## 2015-02-14 MED ORDER — LIDOCAINE HCL 1 % IJ SOLN
INTRAMUSCULAR | Status: DC | PRN
  Administered 2015-02-14: 6 mL via RESPIRATORY_TRACT

## 2015-02-14 MED ORDER — PHENYLEPHRINE HCL 0.25 % NA SOLN: 1.0000 | Freq: Four times a day (QID) | NASAL | Status: DC | PRN

## 2015-02-14 MED ORDER — FENTANYL CITRATE 0.05 MG/ML IJ SOLN
INTRAMUSCULAR | Status: AC
  Filled 2015-02-14: qty 4

## 2015-02-14 MED ORDER — LIDOCAINE HCL 2 % EX GEL
CUTANEOUS | Status: DC | PRN
Start: 2015-02-14 — End: 2015-02-14
  Administered 2015-02-14: 1

## 2015-02-14 MED ORDER — MIDAZOLAM HCL 10 MG/2ML IJ SOLN
INTRAMUSCULAR | Status: AC
  Filled 2015-02-14: qty 4

## 2015-02-14 MED ORDER — FENTANYL CITRATE 0.05 MG/ML IJ SOLN
INTRAMUSCULAR | Status: DC | PRN
  Administered 2015-02-14: 25 ug via INTRAVENOUS
  Administered 2015-02-14: 50 ug via INTRAVENOUS
  Administered 2015-02-14: 25 ug via INTRAVENOUS

## 2015-02-14 MED ORDER — MIDAZOLAM HCL 10 MG/2ML IJ SOLN
INTRAMUSCULAR | Status: DC | PRN
  Administered 2015-02-14: 2 mg via INTRAVENOUS
  Administered 2015-02-14: 3 mg via INTRAVENOUS

## 2015-02-14 NOTE — Discharge Instructions (Signed)
Flexible Bronchoscopy, Care After These instructions give you information on caring for yourself after your procedure. Your doctor may also give you more specific instructions. Call your doctor if you have any problems or questions after your procedure. HOME CARE  Do not eat or drink anything for 2 hours after your procedure. If you try to eat or drink before the medicine wears off, food or drink could go into your lungs. You could also burn yourself.  After 2 hours have passed and when you can cough and gag normally, you may eat soft food and drink liquids slowly.  The day after the test, you may eat your normal diet.  You may do your normal activities.  Keep all doctor visits. GET HELP RIGHT AWAY IF:  You get more and more short of breath.  You get light-headed.  You feel like you are going to pass out (faint).  You have chest pain.  You have new problems that worry you.  You cough up more than a little blood.  You cough up more blood than before. MAKE SURE YOU:  Understand these instructions.  Will watch your condition.  Will get help right away if you are not doing well or get worse. Document Released: 09/23/2009 Document Revised: 12/01/2013 Document Reviewed: 07/31/2013 [employer] Patient Information 2015 [place], [place]. This information is not intended to replace advice given to you by your health care provider. Make sure you discuss any questions you have with your health care provider.  Do not eat or drink anything until after   1:10 pm on     02/14/2015  Please call our office for any questions or problems. [phone]

## 2015-02-14 NOTE — Interval H&P Note (Signed)
PCCM Interval Note:   Ms Carrasco follows today for her cough. It has persisted despite treatment of typical exacerbators so we performed CT chest (no findings, as below) and planned for bronchoscopy for airway inspection. We also held losartan to see if this would change anything > she still has hoarse voice, but the cough and mucous may be slightly better.  No new issues reported.   Filed Vitals:   02/14/15 1030 02/14/15 1035 02/14/15 1040 02/14/15 1043  BP: 168/88 168/88 168/88 168/88  Pulse:  65 64 66  Temp:      TempSrc:      Resp: 16 12 32 52  Height:      Weight:      SpO2: 97% 96% 98% 98%     CT chest 02/11/15 --  COMPARISON: Chest x-ray from 02/24/2014.  FINDINGS: Mediastinum / Lymph Nodes: There is no axillary lymphadenopathy. Thyroid gland is unremarkable. No mediastinal or hilar lymphadenopathy. The rest esophagus has normal imaging features. Atherosclerotic calcification noted in the thoracic aorta. There is apparent origin of the right subclavian artery. Heart size is normal. No pericardial effusion.  Lungs / Pleura: Lung windows show no focal airspace consolidation. No pulmonary edema. No suspicious pulmonary parenchymal nodule or mass. There is some trace scarring in the posterior medial right lower lobe. 2 mm left lower lobe pulmonary nodule is seen on image 26 series 3.  MSK / Soft Tissues: Bone windows reveal no worrisome lytic or sclerotic osseous lesions.  Upper Abdomen: Unremarkable.  IMPRESSION: 1. No evidence for focal airspace disease to suggest pneumonia. 2. 2 mm left lower lobe pulmonary nodule, almost assuredly benign are and. If the patient is at high risk for bronchogenic carcinoma, follow-up chest CT at 1 year is recommended. If the patient is at low risk, no follow-up is needed.    Plan FOB with BAL today.   Baltazar Apo, MD, PhD 02/14/2015, 10:59 AM [facility] [phone] or if no answer [phone]

## 2015-02-14 NOTE — Op Note (Signed)
Video Bronchoscopy Procedure Note  Date of Operation: 02/14/2015  Pre-op Diagnosis: Cough  Post-op Diagnosis: Same  Surgeon: Baltazar Apo  Assistants: none  Anesthesia: conscious sedation, moderate sedation  Meds Given: fentanyl 100 mcg, versed 5mg  in divided doses, 1% lidocaine 20cc total  Operation: Flexible video fiberoptic bronchoscopy and biopsies.  Estimated Blood Loss: 43XV  Complications: none noted  Indications and History: Frances Brown is 69 y.o. with history of cough.  Recommendation was to perform video fiberoptic bronchoscopy to inspect airway tree. The risks, benefits, complications, treatment options and expected outcomes were discussed with the patient.  The possibilities of pneumothorax, pneumonia, reaction to medication, pulmonary aspiration, perforation of a viscus, bleeding, failure to diagnose a condition and creating a complication requiring transfusion or operation were discussed with the patient who freely signed the consent.    Description of Procedure: The patient was seen in the Preoperative Area, was examined and was deemed appropriate to proceed.  The patient was taken to [facility], identified as Frances Brown and the procedure verified as Flexible Video Fiberoptic Bronchoscopy.  A Time Out was held and the above information confirmed.   Conscious sedation was initiated as indicated above. The video fiberoptic bronchoscope was introduced via the R nare and a general inspection was performed which showed a crowded posterior pharynx with a large epiglottis and cobblestone changes superior to the epiglottis. There were prominent false cords with closure of the glottis proper when she was sedated. With stimulation the true vocal cords were visualized and were normal in appearance. The cords were passed revealing slight rightward deviation but otherwise normal trachea. There was some mild generalized edema that was best seen at the main carina, the RML  carina and the LUL carina. Otherwise all of the airways were normal in appearance. There were some scattered white secretions that were easily suctioned. There were no endobronchial lesions seen.   Endobronchial forceps biopsies were performed at the swollen RML and main carinae. There was some initial mild bleeding that stopped quickly. Finally BAL was performed in the LUL to be sent for cultures and micro. The patient tolerated the procedure well. She experienced some transient hypoxemia with her initial sedation that resolved quickly once the bronchoscope passed the relaxed glottis. Her BP increased very briefly at the end of the procedure to the 190 -200's when she was coughing vigorously. This decreased spontaneously when the scope was removed. There were no obvious complications.   Samples: 1. Endobronchial forceps biopsies from main carina 2. Endobronchial forceps biopsies from RML carina 3. BAL from the LUL   Plans:  We will review the pathology and microbiology results with the patient when they become available.  Outpatient followup will be with Dr Lamonte Sakai.    Baltazar Apo, MD, PhD 02/14/2015, 11:27 AM [facility] [phone] or if no answer [phone]

## 2015-02-14 NOTE — H&P (View-Only) (Signed)
Subjective:    Patient ID: Frances Brown, female    DOB: [birth year], 69 y.o.   MRN: [id number]  Cough Pertinent negatives include no ear pain, eye redness, fever, headaches, postnasal drip, rash, rhinorrhea, sore throat, shortness of breath or wheezing.   69 yo woman, never smoker, hx HTN, hyperlipidemia, IBS. She is a self referral for evaluation of a persistant cough. She describes onset of cough beginning September '14, sometimes productive of clear mucous, sometimes dry. She has some allergies and more mucous production as well. She did a 1 week trial of nasal steroid, then more recently has been tried on omeprazole > has been on this for a week. She A CXR was done by Dr Minna Antis in 12/2013 that was reported as clear. She has occasional GERD sx but not regularly. She is having head congestion but this has waxed and waned thorough the months. She feels a globus sensation, non-productive. Clears her throat frequently. No new meds.   ROV 02/24/14 -- follows up for cough in setting allergies, occasional GERD. We restarted fluticasone + loratadine, increased omeprazole temporarily. Returns today. She is coughing less, but still has hoarse voice and feels need to clear her throat. It bothers her the most after she has been outside.   ROV 04/02/14 -- follows for cough, has hx allergies and GERD. We recently treated her for acute bronchitis with doxycycline. She has improved - no more fever or yellow sputum. She is on [place] but temporarily stopped her loratadine and omeprazole, doesn't feel any overt reflux.   ROV 10/01/14 -- follows for cough, has hx allergies and GERD. She has been doing fairly well, she had a recent episode of reflux that resulted in mucous, some UA irritation. She is taking omeprazole 40mg  daily, loratadine prn.   ROV 02/10/15 -- follow-up visit for chronic cough, allergic rhinitis, hoarseness. She also has occasional GERD.  She has been taking omeprazole and loratadine as ordered. Her cough  has been worse, continues to be bad at night, has paroxysms.  She is bringing up mucous, often productive of yellow dark in the am's. She is on losartan. Has rare GERD sx.    Review of Systems  Constitutional: Negative for fever and unexpected weight change.  HENT: Negative for congestion, dental problem, ear pain, nosebleeds, postnasal drip, rhinorrhea, sinus pressure, sneezing, sore throat and trouble swallowing.   Eyes: Negative for redness and itching.  Respiratory: Negative for cough, chest tightness, shortness of breath and wheezing.   Cardiovascular: Negative for palpitations and leg swelling.  Gastrointestinal: Negative for nausea and vomiting.  Genitourinary: Negative for dysuria.  Musculoskeletal: Negative for joint swelling.  Skin: Negative for rash.  Neurological: Negative for headaches.  Hematological: Does not bruise/bleed easily.  Psychiatric/Behavioral: Negative for dysphoric mood. The patient is not nervous/anxious.        Objective:   Physical Exam Filed Vitals:   02/10/15 1551  BP: 130/82  Pulse: 74  Height: 5\' 4"  (1.626 m)  Weight: 185 lb (83.915 kg)  SpO2: 95%   Gen: Pleasant, well-nourished, in no distress,  normal affect  ENT: No lesions,  mouth clear, posterior pharyngeal erythema and mucous   Neck: No JVD, no TMG, no carotid bruits  Lungs: No use of accessory muscles, clear without rales or rhonchi,    Cardiovascular: RRR, heart sounds normal, no murmur or gallops, no peripheral edema  Musculoskeletal: No deformities, no cyanosis or clubbing  Neuro: alert, non focal  Skin: Warm, no lesions or rashes  Assessment & Plan:  Cough Persists despite treatment of GERD and allergic rhinitis. I still suspect that she has upper airway irritation syndrome but she needs more since workup for cough at this point  We will perform a CT scan of your chest  We will perform full breathing tests We will arrange for a bronchoscopy to look at your airways   Please temporarily stop you losartan for 2 weeks to see if you cough changes. Keep track of any changes and call us before going back on the medications.  Follow with Dr Lamonte Sakai in 6 weeks or sooner if you have any problems

## 2015-02-14 NOTE — Progress Notes (Signed)
Video bronchoscopy performed. Intervention bronchial biopsy Intervention bronchial washing  Patient tolerated procedure well.  No complications noted.  Baltazar Apo, MD, PhD 02/14/2015, 3:08 PM [facility] [phone] or if no answer [phone]

## 2015-02-15 ENCOUNTER — Encounter (HOSPITAL_COMMUNITY): Payer: Self-pay | Admitting: Emergency Medicine

## 2015-02-16 ENCOUNTER — Ambulatory Visit: Payer: Medicare Other | Admitting: Cardiovascular Disease

## 2015-02-16 LAB — CULTURE, BAL-QUANTITATIVE W GRAM STAIN
Colony Count: 10000
Special Requests: NORMAL

## 2015-02-16 LAB — CULTURE, BAL-QUANTITATIVE

## 2015-02-17 ENCOUNTER — Telehealth: Payer: Self-pay | Admitting: Emergency Medicine

## 2015-02-17 NOTE — Progress Notes (Signed)
Quick Note:  lmtcb X1 to relay results. ______ 

## 2015-02-17 NOTE — Progress Notes (Signed)
Quick Note:  Spoke with pt and notified of results per Dr. Byrum. Pt verbalized understanding and denied any questions.  ______ 

## 2015-02-17 NOTE — Telephone Encounter (Signed)
Notes Recorded by Collene Gobble, MD on 02/16/2015 at 11:23 PM Please notify the patient that all of her biopsies are normal. Her cultures have been sent and are all pending, negative to date.  Spoke with pt and notified of results per Dr. Lamonte Sakai. Pt verbalized understanding and denied any questions.

## 2015-02-21 ENCOUNTER — Telehealth: Payer: Self-pay | Admitting: Emergency Medicine

## 2015-02-21 ENCOUNTER — Telehealth: Payer: Self-pay | Admitting: Internal Medicine

## 2015-02-21 NOTE — Telephone Encounter (Signed)
Left message for patient to call back  

## 2015-02-21 NOTE — Telephone Encounter (Signed)
Patient with extensive testing for cough and hoarseness.  She is still having additional testing over the next month.  She is scheduled with Dr. Carlean Purl for 04/08/15.  She will call back and cancel if a cause is found prior to the appt.

## 2015-02-21 NOTE — Telephone Encounter (Signed)
Losartan would not cause phlegm production.  She needs to have ROV to assess >> please schedule with Tammy Parrett for tomorrow.

## 2015-02-21 NOTE — Telephone Encounter (Signed)
Patient says that she stopped taking the Losartan for 2 weeks.  Patient would like to start back on the Losartan. Patient has a lot of congestion, she is getting more hoarse.  Patient would like to know what she should do?  Should she start back on the Losartan again?  Patient is coughing up yellow phlegm.  She had fever 100.9.  Patient wants to know what her CT shows.  Her cough is worse.    Sent to Dr. Halford Chessman in Dr. Agustina Caroli absence.

## 2015-02-21 NOTE — Telephone Encounter (Signed)
Called spoke with pt. She refused to make appt tomorrow w/ TP. She wanted OV w/ RB. This was scheduled for Thursday at 9:30. Pt aware to call us back or seek emergency care if she worsens. Nothing further needed

## 2015-02-24 ENCOUNTER — Encounter (INDEPENDENT_AMBULATORY_CARE_PROVIDER_SITE_OTHER): Payer: Self-pay

## 2015-02-24 ENCOUNTER — Ambulatory Visit (INDEPENDENT_AMBULATORY_CARE_PROVIDER_SITE_OTHER): Payer: Medicare Other | Admitting: Emergency Medicine

## 2015-02-24 ENCOUNTER — Encounter: Payer: Self-pay | Admitting: Emergency Medicine

## 2015-02-24 VITALS — BP 130/88 | HR 78 | Ht 66.0 in | Wt 178.0 lb

## 2015-02-24 DIAGNOSIS — R05 Cough: Secondary | ICD-10-CM | POA: Diagnosis not present

## 2015-02-24 DIAGNOSIS — J209 Acute bronchitis, unspecified: Secondary | ICD-10-CM | POA: Insufficient documentation

## 2015-02-24 DIAGNOSIS — R059 Cough, unspecified: Secondary | ICD-10-CM

## 2015-02-24 MED ORDER — LEVOFLOXACIN 500 MG PO TABS: 500.0000 mg | ORAL_TABLET | Freq: Every day | ORAL | Status: DC

## 2015-02-24 MED ORDER — OMEPRAZOLE 20 MG PO CPDR: DELAYED_RELEASE_CAPSULE | ORAL | Status: DC

## 2015-02-24 NOTE — Assessment & Plan Note (Signed)
Reviewed her bronchoscopy results and her CT scan with her today. Personally review the CT scan. There were no infiltrates or other lesions to suggest an etiology for her cough. Her culture results are still pending but are all negative to date. I suspect that refractory GERD is playing at least some role here given her description of her symptoms. We will continue to try and treat. Acutely she has evidence for a bacterial bronchitis in the aftermath of her bronchoscopy. I like to treat her as such.   Please take levofloxacin 500 mg daily for the next 7 days Please call our office after you finish the levofloxacin to let us know how you're doing, and the name of your nasal spray After you have finished your levofloxacin please increased your omeprazole to 40 mg twice a day for 10 days, then go back to once a day. Take this either 1 hour before or 1 hour after eating.  Continue your loratadine 10 mg daily We will follow your culture results from her bronchoscopy out to completion Follow with Dr Lamonte Sakai in 6 weeks or sooner if you have any problems

## 2015-02-24 NOTE — Progress Notes (Signed)
Subjective:    Patient ID: Frances Brown, female    DOB: 1946/05/31, 69 y.o.   MRN: [id number]  Cough Pertinent negatives include no ear pain, eye redness, fever, headaches, postnasal drip, rash, rhinorrhea, sore throat, shortness of breath or wheezing.   69 yo woman, never smoker, hx HTN, hyperlipidemia, IBS. She is a self referral for evaluation of a persistant cough. She describes onset of cough beginning September '14, sometimes productive of clear mucous, sometimes dry. She has some allergies and more mucous production as well. She did a 1 week trial of nasal steroid, then more recently has been tried on omeprazole > has been on this for a week. She A CXR was done by Dr Minna Antis in 12/2013 that was reported as clear. She has occasional GERD sx but not regularly. She is having head congestion but this has waxed and waned thorough the months. She feels a globus sensation, non-productive. Clears her throat frequently. No new meds.   ROV 02/24/14 -- follows up for cough in setting allergies, occasional GERD. We restarted fluticasone + loratadine, increased omeprazole temporarily. Returns today. She is coughing less, but still has hoarse voice and feels need to clear her throat. It bothers her the most after she has been outside.   ROV 04/02/14 -- follows for cough, has hx allergies and GERD. We recently treated her for acute bronchitis with doxycycline. She has improved - no more fever or yellow sputum. She is on [place] but temporarily stopped her loratadine and omeprazole, doesn't feel any overt reflux.   ROV 10/01/14 -- follows for cough, has hx allergies and GERD. She has been doing fairly well, she had a recent episode of reflux that resulted in mucous, some UA irritation. She is taking omeprazole 40mg  daily, loratadine prn.   ROV 02/10/15 -- follow-up visit for chronic cough, allergic rhinitis, hoarseness. She also has occasional GERD.  She has been taking omeprazole and loratadine as ordered. Her cough  has been worse, continues to be bad at night, has paroxysms.  She is bringing up mucous, often productive of yellow dark in the am's. She is on losartan. Has rare GERD sx.   ROV 02/24/15 -- follow-up visit for chronic cough allergic rhinitis and hoarseness. She underwent bronchoscopy on 02/14/15. There was some mild edematous change and swelling at the right middle lobe and main carinae that were biopsied - these were negative.  AFB and fungal that cultures are all pending. She stopped losartan temporarily to see if it would change anything - no significant changes in her cough. She has had some low grade fever. She remains on loratadine, is using a nasal spray prn but she cannot recall the name. She is on omeprazole 40mg  qd.    Review of Systems  Constitutional: Negative for fever and unexpected weight change.  HENT: Negative for congestion, dental problem, ear pain, nosebleeds, postnasal drip, rhinorrhea, sinus pressure, sneezing, sore throat and trouble swallowing.   Eyes: Negative for redness and itching.  Respiratory: Negative for cough, chest tightness, shortness of breath and wheezing.   Cardiovascular: Negative for palpitations and leg swelling.  Gastrointestinal: Negative for nausea and vomiting.  Genitourinary: Negative for dysuria.  Musculoskeletal: Negative for joint swelling.  Skin: Negative for rash.  Neurological: Negative for headaches.  Hematological: Does not bruise/bleed easily.  Psychiatric/Behavioral: Negative for dysphoric mood. The patient is not nervous/anxious.        Objective:   Physical Exam Filed Vitals:   02/24/15 0924  BP: 130/88  Pulse: 78  Height: 5\' 6"  (1.676 m)  Weight: 178 lb (80.74 kg)  SpO2: 99%   Gen: Pleasant, well-nourished, in no distress,  normal affect  ENT: No lesions,  mouth clear, posterior pharyngeal erythema and mucous   Neck: No JVD, no TMG, no carotid bruits  Lungs: No use of accessory muscles, clear without rales or rhonchi,     Cardiovascular: RRR, heart sounds normal, no murmur or gallops, no peripheral edema  Musculoskeletal: No deformities, no cyanosis or clubbing  Neuro: alert, non focal  Skin: Warm, no lesions or rashes     Assessment & Plan:  Cough Reviewed her bronchoscopy results and her CT scan with her today. Personally review the CT scan. There were no infiltrates or other lesions to suggest an etiology for her cough. Her culture results are still pending but are all negative to date. I suspect that refractory GERD is playing at least some role here given her description of her symptoms. We will continue to try and treat. Acutely she has evidence for a bacterial bronchitis in the aftermath of her bronchoscopy. I like to treat her as such.   Please take levofloxacin 500 mg daily for the next 7 days Please call our office after you finish the levofloxacin to let us know how you're doing, and the name of your nasal spray After you have finished your levofloxacin please increased your omeprazole to 40 mg twice a day for 10 days, then go back to once a day. Take this either 1 hour before or 1 hour after eating.  Continue your loratadine 10 mg daily We will follow your culture results from her bronchoscopy out to completion Follow with Dr Lamonte Sakai in 6 weeks or sooner if you have any problems   Acute bronchitis With fever, purulent sputum post bronchoscopy.   Will treat with Levaquin 500 mg daily for 7 days. She will then call me to let me know if her symptoms are improving. Going to defer adding to her maintenance cough regimen until the acute infection has been treated as below

## 2015-02-24 NOTE — Patient Instructions (Addendum)
Please take levofloxacin 500 mg daily for the next 7 days Please call our office after you finish the levofloxacin to let us know how you're doing, and the name of your nasal spray After you have finished your levofloxacin please increased your omeprazole to 40 mg twice a day for 10 days, then go back to once a day. Take this either 1 hour before or 1 hour after eating.  Continue your loratadine 10 mg daily We will follow your culture results from her bronchoscopy out to completion Follow with Dr Lamonte Sakai in 6 weeks or sooner if you have any problems

## 2015-02-24 NOTE — Assessment & Plan Note (Signed)
With fever, purulent sputum post bronchoscopy.   Will treat with Levaquin 500 mg daily for 7 days. She will then call me to let me know if her symptoms are improving. Going to defer adding to her maintenance cough regimen until the acute infection has been treated as below

## 2015-02-28 ENCOUNTER — Encounter: Payer: Self-pay | Admitting: Emergency Medicine

## 2015-03-01 MED ORDER — PREDNISONE 10 MG PO TABS: ORAL_TABLET | ORAL | Status: DC

## 2015-03-01 NOTE — Telephone Encounter (Signed)
Spoke with pt over the phone. States that her condition has not changed since seeing RB last. Is afraid with the holiday coming up that she will get worse when we aren't here. Wants another round of antibiotics and possibly some prednisone.  RB - please advise. Thanks.

## 2015-03-01 NOTE — Telephone Encounter (Signed)
Spoke with GI, her appointment has been moved up to 03/11/15 at 9:30am with Alonza Bogus, PA. Pt is aware of this appointment change. Rx will be sent to [facility].

## 2015-03-01 NOTE — Telephone Encounter (Signed)
Ok to repeat her prednisone. Take 40mg  daily for 3 days, then 30mg  daily for 3 days, then 20mg  daily for 3 days, then 10mg  daily for 3 days, then stop  I believe this is GERD + rhinitis and the pred will only work temporarily.  If we can get her in with Dr Carlean Purl sooner then we should do that  She has only been on high dose PPI for 4-5 days, I am not sure it has been long enough to see all the improvement we hope to get.

## 2015-03-11 ENCOUNTER — Encounter: Payer: Self-pay | Admitting: Gastroenterology

## 2015-03-11 ENCOUNTER — Ambulatory Visit (INDEPENDENT_AMBULATORY_CARE_PROVIDER_SITE_OTHER): Payer: Medicare Other | Admitting: Gastroenterology

## 2015-03-11 VITALS — BP 130/68 | HR 72 | Ht 65.0 in | Wt 178.0 lb

## 2015-03-11 DIAGNOSIS — R490 Dysphonia: Secondary | ICD-10-CM

## 2015-03-11 DIAGNOSIS — R05 Cough: Secondary | ICD-10-CM | POA: Diagnosis not present

## 2015-03-11 DIAGNOSIS — R059 Cough, unspecified: Secondary | ICD-10-CM

## 2015-03-11 NOTE — Patient Instructions (Signed)
You have been scheduled for an endoscopy. Please follow written instructions given to you at your visit today. If you use inhalers (even only as needed), please bring them with you on the day of your procedure. Your physician has requested that you go to www.startemmi.com and enter the access code given to you at your visit today. This web site gives a general overview about your procedure. However, you should still follow specific instructions given to you by our office regarding your preparation for the procedure. CC:  Tommy Medal MD

## 2015-03-11 NOTE — Progress Notes (Signed)
     03/11/2015 Frances Brown [id number] December 15, 1945   History of Present Illness:  This is a 69 year old female who is known to Dr. Carlean Purl.  She is here today at the request of pulmonary, Dr. Lamonte Sakai, for evaluation regarding complaints of hoarseness and cough.  Cough sometimes worse at night.  Says that this seemed to begin, at least to the point that it has been bothering her, in August or September.  She saw her PCP first who thought it was allergies.  She denies any heartburn, but admits to "mucus" coming up.  She has seen pulmonary who performed a bronchoscopy on 3/8, which she was told at this point was unremarkable.  She has been on omeprazole 20 mg daily for a while and that was gradually increased, first to 40 mg daily, and then recently she took 40 mg BID for 10 days without really any improvement.  She was told that maybe the high content of Zinc and Copper in her Preservision tablets for her eyes was causing a lot of reflux issues so she has been off of that for a short time now (had been on that medication for 2 or 3 years).  Patient asking if she is due for colonoscopy.  Colonoscopy 04/2007 with only diverticulosis.  Apparently has remote history of adenomatous polyps, but I do not think that this affects her colonoscopy interval at this time.  No lower GI complaints.  No first degree relative with history of colon cancer.    Current Medications, Allergies, Past Medical History, Past Surgical History, Family History and Social History were reviewed in [employer] record.   Physical Exam: BP 130/68 mmHg  Pulse 72  Ht 5\' 5"  (1.651 m)  Wt 178 lb (80.74 kg)  BMI 29.62 kg/m2 General: Well developed white female in no acute distress Head: Normocephalic and atraumatic Eyes:  Sclerae anicteric, conjunctiva pink  Ears: Normal auditory acuity Lungs: Clear throughout to auscultation Heart: Regular rate and rhythm Abdomen: Soft, non-distended.  Normal bowel sounds.   Non-tender. Musculoskeletal: Symmetrical with no gross deformities  Extremities: No edema  Neurological: Alert oriented x 4, grossly non-focal Psychological:  Alert and cooperative. Normal mood and affect  Assessment and Recommendations: -Cough/hoarseness:  Thought to be secondary to GERD by pulmonary.  No improvement on omeprazole 40 mg BID for 10 days and long-term PPI therapy. Will repeat EGD, which will be scheduled with Dr. Carlean Purl.  The risks, benefits, and alternatives to EGD were discussed with the patient and she consents to proceed.  Pending results, may need pH/impendence study.   -Regarding colonoscopy, I do not think that patient is due for colonoscopy at this time.  Colonoscopy 04/2007 with only diverticulosis.  Apparently has remote history of adenomatous polyps, but I do not think that this affects her colonoscopy interval at this time.  No lower GI complaints.  Will enter recall for 04/2017, unless Dr. Carlean Purl disagrees.    CC:  Dr. Baltazar Apo  CC:  Dr. Tommy Medal

## 2015-03-12 LAB — FUNGUS CULTURE W SMEAR
Fungal Smear: NONE SEEN
Special Requests: NORMAL

## 2015-03-14 NOTE — Progress Notes (Signed)
Agree with [name] management. Next step in evaluation would most likely be impedance/pH study unless EGD suggests something else Can wait until 2018 for colonoscopy per current guidelines Gatha Mayer, MD, [facility]

## 2015-03-16 ENCOUNTER — Encounter: Payer: Self-pay | Admitting: Internal Medicine

## 2015-03-16 ENCOUNTER — Ambulatory Visit (AMBULATORY_SURGERY_CENTER): Payer: Medicare Other | Admitting: Internal Medicine

## 2015-03-16 VITALS — BP 137/68 | HR 68 | Temp 97.1°F | Resp 18 | Ht 65.0 in | Wt 178.0 lb

## 2015-03-16 DIAGNOSIS — K317 Polyp of stomach and duodenum: Secondary | ICD-10-CM

## 2015-03-16 DIAGNOSIS — R490 Dysphonia: Secondary | ICD-10-CM | POA: Diagnosis not present

## 2015-03-16 DIAGNOSIS — K219 Gastro-esophageal reflux disease without esophagitis: Secondary | ICD-10-CM | POA: Diagnosis not present

## 2015-03-16 DIAGNOSIS — K2950 Unspecified chronic gastritis without bleeding: Secondary | ICD-10-CM | POA: Diagnosis not present

## 2015-03-16 MED ORDER — SODIUM CHLORIDE 0.9 % IV SOLN: 500.0000 mL | INTRAVENOUS | Status: DC

## 2015-03-16 MED ORDER — ESOMEPRAZOLE MAGNESIUM 40 MG PO CPDR: 40.0000 mg | DELAYED_RELEASE_CAPSULE | Freq: Two times a day (BID) | ORAL | Status: DC

## 2015-03-16 NOTE — Progress Notes (Signed)
Report to PACU, RN, vss, BBS= Clear.  

## 2015-03-16 NOTE — Op Note (Signed)
[place]  [address] [place], [id number]   ENDOSCOPY PROCEDURE REPORT  PATIENT: Frances, Brown  MR#: [id number] BIRTHDATE: Apr 19, 1946 , 74  yrs. old GENDER: female ENDOSCOPIST: Gatha Mayer, MD, [facility] PROCEDURE DATE:  03/16/2015 PROCEDURE:  EGD w/ biopsy ASA CLASS:     Class II INDICATIONS:  heartburn and hoarseness, ? GERD. MEDICATIONS: Propofol 200 mg IV and Monitored anesthesia care TOPICAL ANESTHETIC: none  DESCRIPTION OF PROCEDURE: After the risks benefits and alternatives of the procedure were thoroughly explained, informed consent was obtained.  The LB [id number] endoscope was introduced through the mouth and advanced to the second portion of the duodenum , Without limitations.  The instrument was slowly withdrawn as the mucosa was fully examined.    1) Multiple soft, fleshy subcentimeter polyps that look like fundic gland polyps - biopsied. 2) 3 cm (37-40) hiatal hernia 3) Otherwise normal EGD - think larynx ok also.  Retroflexed views revealed a hiatal hernia.     The scope was then withdrawn from the patient and the procedure completed.  COMPLICATIONS: There were no immediate complications.  ENDOSCOPIC IMPRESSION: 1) Multiple soft, fleshy subcentimeter polyps that look like fundic gland polyps - biopsied. 2) 3 cm (37-40) hiatal hernia 3) Otherwise normal EGD - think larynx ok also but if ? would have ENT evaluate  RECOMMENDATIONS: 1.  Office will call with results 2.  Continue PPI  - change to esomeprazole 40 mg bid before breakfast and supper - if still symptomatic then would likely proceed to 24 hour pH and impedance on PPI 3.  See me in June   eSigned:  Gatha Mayer, MD, [facility] 03/16/2015 11:18 AM    CC: The Patient, Dr. Londell Moh  and Dr. Tommy Medal

## 2015-03-16 NOTE — Patient Instructions (Addendum)
I found some polyps in the stomach. They look benign and I do not think they will lead to any problems - I took biopsies to understand better and will let you know.  I had mentioned testing for reflux to you but before we do that I recommend taking acid blocking medication twice a day at higher doses. I have prescribed esomeprazole (Nexium) 40 mg twice a day ([facility]) and want you to take that before breakfast and supper.  Let's give this 2 months to work before we say it doesn't. I should see you in June  please call soon and get an appointment.  I appreciate the opportunity to care for you. Gatha Mayer, MD, FACG     YOU HAD AN ENDOSCOPIC PROCEDURE TODAY AT [facility]:   Refer to the procedure report that was given to you for any specific questions about what was found during the examination.  If the procedure report does not answer your questions, please call your gastroenterologist to clarify.  If you requested that your care partner not be given the details of your procedure findings, then the procedure report has been included in a sealed envelope for you to review at your convenience later.  YOU SHOULD EXPECT: Some feelings of bloating in the abdomen. Passage of more gas than usual.  Walking can help get rid of the air that was put into your GI tract during the procedure and reduce the bloating. If you had a lower endoscopy (such as a colonoscopy or flexible sigmoidoscopy) you may notice spotting of blood in your stool or on the toilet paper. If you underwent a bowel prep for your procedure, you may not have a normal bowel movement for a few days.  Please Note:  You might notice some irritation and congestion in your nose or some drainage.  This is from the oxygen used during your procedure.  There is no need for concern and it should clear up in a day or so.  SYMPTOMS TO REPORT IMMEDIATELY:     Following upper endoscopy (EGD)  Vomiting of blood or coffee  ground material  New chest pain or pain under the shoulder blades  Painful or persistently difficult swallowing  New shortness of breath  Fever of 100F or higher  Black, tarry-looking stools  For urgent or emergent issues, a gastroenterologist can be reached at any hour by calling [phone].   DIET: Your first meal following the procedure should be a small meal and then it is ok to progress to your normal diet. Heavy or fried foods are harder to digest and may make you feel nauseous or bloated.  Likewise, meals heavy in dairy and vegetables can increase bloating.  Drink plenty of fluids but you should avoid alcoholic beverages for 24 hours.  ACTIVITY:  You should plan to take it easy for the rest of today and you should NOT DRIVE or use heavy machinery until tomorrow (because of the sedation medicines used during the test).    FOLLOW UP: Our staff will call the number listed on your records the next business day following your procedure to check on you and address any questions or concerns that you may have regarding the information given to you following your procedure. If we do not reach you, we will leave a message.  However, if you are feeling well and you are not experiencing any problems, there is no need to return our call.  We will assume that you  have returned to your regular daily activities without incident.  If any biopsies were taken you will be contacted by phone or by letter within the next 1-3 weeks.  Please call us at [phone] if you have not heard about the biopsies in 3 weeks.    SIGNATURES/CONFIDENTIALITY: You and/or your care partner have signed paperwork which will be entered into your electronic medical record.  These signatures attest to the fact that that the information above on your After Visit Summary has been reviewed and is understood.  Full responsibility of the confidentiality of this discharge information lies with you and/or your  care-partner.   Resume medications. Call office to schedule follow up for June. Information given for Hiatal Hernia with discharge instructions.

## 2015-03-16 NOTE — Progress Notes (Signed)
Called to room to assist during endoscopic procedure.  Patient ID and intended procedure confirmed with present staff. Received instructions for my participation in the procedure from the performing physician.  

## 2015-03-17 ENCOUNTER — Telehealth: Payer: Self-pay | Admitting: *Deleted

## 2015-03-17 NOTE — Telephone Encounter (Signed)
  Follow up Call-  Call back number 03/16/2015  Post procedure Call Back phone  # [phone]  Permission to leave phone message Yes     Patient questions:  Do you have a fever, pain , or abdominal swelling? No. Pain Score  0 *  Have you tolerated food without any problems? Yes.    Have you been able to return to your normal activities? Yes.    Do you have any questions about your discharge instructions: Diet   No. Medications  No. Follow up visit  No.  Do you have questions or concerns about your Care? No.  Actions: * If pain score is 4 or above: No action needed, pain <4.  Pt c/o headache this morning.  She wanted to know if I thought it could be coming from taking nexium twice a day as opposed to just once.  I told her that it was doubtful that this was the cause of her headache.  She will call back if symptoms continue.

## 2015-03-22 ENCOUNTER — Other Ambulatory Visit: Payer: Self-pay

## 2015-03-22 MED ORDER — ESOMEPRAZOLE MAGNESIUM 40 MG PO CPDR: 40.0000 mg | DELAYED_RELEASE_CAPSULE | Freq: Two times a day (BID) | ORAL | Status: DC

## 2015-03-24 ENCOUNTER — Encounter: Payer: Self-pay | Admitting: Internal Medicine

## 2015-03-24 DIAGNOSIS — D131 Benign neoplasm of stomach: Secondary | ICD-10-CM

## 2015-03-24 HISTORY — DX: Benign neoplasm of stomach: D13.1

## 2015-03-24 NOTE — Progress Notes (Signed)
Quick Note:  Polyps are benign and not precancerous Please check to make sure she started bid esomeprazole She needs an appointment for June in office please  No letter or recll from [facility] ______

## 2015-03-30 LAB — AFB CULTURE WITH SMEAR (NOT AT ARMC)
ACID FAST SMEAR: NONE SEEN
SPECIAL REQUESTS: NORMAL

## 2015-04-07 ENCOUNTER — Ambulatory Visit (INDEPENDENT_AMBULATORY_CARE_PROVIDER_SITE_OTHER): Payer: Medicare Other | Admitting: Emergency Medicine

## 2015-04-07 ENCOUNTER — Encounter: Payer: Self-pay | Admitting: Emergency Medicine

## 2015-04-07 ENCOUNTER — Telehealth: Payer: Self-pay | Admitting: Emergency Medicine

## 2015-04-07 VITALS — BP 150/102 | HR 90 | Ht 65.0 in | Wt 184.0 lb

## 2015-04-07 DIAGNOSIS — R059 Cough, unspecified: Secondary | ICD-10-CM

## 2015-04-07 DIAGNOSIS — R05 Cough: Secondary | ICD-10-CM

## 2015-04-07 LAB — PULMONARY FUNCTION TEST
DL/VA % pred: 87 %
DL/VA: 4.29 ml/min/mmHg/L
DLCO unc % pred: 71 %
DLCO unc: 18.37 ml/min/mmHg
FEF 25-75 Post: 2.99 L/sec
FEF 25-75 Pre: 2.64 L/sec
FEF2575-%Change-Post: 13 %
FEF2575-%Pred-Post: 155 %
FEF2575-%Pred-Pre: 137 %
FEV1-%Change-Post: 2 %
FEV1-%Pred-Post: 105 %
FEV1-%Pred-Pre: 102 %
FEV1-Post: 2.46 L
FEV1-Pre: 2.39 L
FEV1FVC-%Change-Post: 3 %
FEV1FVC-%Pred-Pre: 106 %
FEV6-%Change-Post: 0 %
FEV6-%Pred-Post: 98 %
FEV6-%Pred-Pre: 99 %
FEV6-Post: 2.93 L
FEV6-Pre: 2.95 L
FEV6FVC-%Pred-Post: 104 %
FEV6FVC-%Pred-Pre: 104 %
FVC-%Change-Post: 0 %
FVC-%Pred-Post: 94 %
FVC-%Pred-Pre: 95 %
FVC-Post: 2.93 L
FVC-Pre: 2.95 L
Post FEV1/FVC ratio: 84 %
Post FEV6/FVC ratio: 100 %
Pre FEV1/FVC ratio: 81 %
Pre FEV6/FVC Ratio: 100 %
RV % pred: 85 %
RV: 1.93 L
TLC % pred: 95 %
TLC: 4.95 L

## 2015-04-07 NOTE — Progress Notes (Signed)
PFT done today. 

## 2015-04-07 NOTE — Patient Instructions (Addendum)
Your breathing tests, CT scan and bronchoscopy are all normal.  Please continue your nexium once a day  Please continue loratadine once a day  Restart your nasal spray.  Try to stop throat clearing if possible. Use sugar free candy  Follow with Dr Lamonte Sakai in 12 months or sooner if you have any problems

## 2015-04-07 NOTE — Progress Notes (Signed)
0  Subjective:    Patient ID: Frances Brown, female    DOB: [birth year], 69 y.o.   MRN: [id number]  Cough Pertinent negatives include no ear pain, eye redness, fever, headaches, postnasal drip, rash, rhinorrhea, sore throat, shortness of breath or wheezing.   69 yo woman, never smoker, hx HTN, hyperlipidemia, IBS. She is a self referral for evaluation of a persistant cough. She describes onset of cough beginning September '14, sometimes productive of clear mucous, sometimes dry. She has some allergies and more mucous production as well. She did a 1 week trial of nasal steroid, then more recently has been tried on omeprazole > has been on this for a week. She A CXR was done by Dr Minna Antis in 12/2013 that was reported as clear. She has occasional GERD sx but not regularly. She is having head congestion but this has waxed and waned thorough the months. She feels a globus sensation, non-productive. Clears her throat frequently. No new meds.   ROV 02/24/14 -- follows up for cough in setting allergies, occasional GERD. We restarted fluticasone + loratadine, increased omeprazole temporarily. Returns today. She is coughing less, but still has hoarse voice and feels need to clear her throat. It bothers her the most after she has been outside.   ROV 04/02/14 -- follows for cough, has hx allergies and GERD. We recently treated her for acute bronchitis with doxycycline. She has improved - no more fever or yellow sputum. She is on [place] but temporarily stopped her loratadine and omeprazole, doesn't feel any overt reflux.   ROV 10/01/14 -- follows for cough, has hx allergies and GERD. She has been doing fairly well, she had a recent episode of reflux that resulted in mucous, some UA irritation. She is taking omeprazole 40mg  daily, loratadine prn.   ROV 02/10/15 -- follow-up visit for chronic cough, allergic rhinitis, hoarseness. She also has occasional GERD.  She has been taking omeprazole and loratadine as ordered. Her  cough has been worse, continues to be bad at night, has paroxysms.  She is bringing up mucous, often productive of yellow dark in the am's. She is on losartan. Has rare GERD sx.   ROV 02/24/15 -- follow-up visit for chronic cough allergic rhinitis and hoarseness. She underwent bronchoscopy on 02/14/15. There was some mild edematous change and swelling at the right middle lobe and main carinae that were biopsied - these were negative.  AFB and fungal that cultures are all pending. She stopped losartan temporarily to see if it would change anything - no significant changes in her cough. She has had some low grade fever. She remains on loratadine, is using a nasal spray prn but she cannot recall the name. She is on omeprazole 40mg  qd.   ROV 04/07/15 -- follow-up visit for chronic cough, allergic rhinitis and hoarseness. She's had a fairly extensive workup that included a negative CT scan of the chest, bronchoscopy with negative cultures. She's been treated with omeprazole, Nexium, loratadine. Also a nasal spray (? Which one). PFT for done today which showed absolutely normal airflow without an bronchodilator response, normal lung volumes and a normal adjusted diffusion capacity. She is still doing some throat clearing. Her cough is better. Has hoarseness. Currently taking Nexium qd (Dr Carlean Purl recommended bid).    Review of Systems  Constitutional: Negative for fever and unexpected weight change.  HENT: Negative for congestion, dental problem, ear pain, nosebleeds, postnasal drip, rhinorrhea, sinus pressure, sneezing, sore throat and trouble swallowing.   Eyes: Negative for  redness and itching.  Respiratory: Positive for cough. Negative for chest tightness, shortness of breath and wheezing.   Cardiovascular: Negative for palpitations and leg swelling.  Gastrointestinal: Negative for nausea and vomiting.  Genitourinary: Negative for dysuria.  Musculoskeletal: Negative for joint swelling.  Skin: Negative for  rash.  Neurological: Negative for headaches.  Hematological: Does not bruise/bleed easily.  Psychiatric/Behavioral: Negative for dysphoric mood. The patient is not nervous/anxious.        Objective:   Physical Exam Filed Vitals:   04/07/15 1002  BP: 150/102  Pulse: 90  Height: 5\' 5"  (1.651 m)  Weight: 184 lb (83.462 kg)  SpO2: 98%   Gen: Pleasant, well-nourished, in no distress,  normal affect  ENT: No lesions,  mouth clear, posterior pharyngeal erythema and mucous   Neck: No JVD, no TMG, no carotid bruits  Lungs: No use of accessory muscles, clear without rales or rhonchi,    Cardiovascular: RRR, heart sounds normal, no murmur or gallops, no peripheral edema  Musculoskeletal: No deformities, no cyanosis or clubbing  Neuro: alert, non focal  Skin: Warm, no lesions or rashes     Assessment & Plan:  Cough Your breathing tests, CT scan and bronchoscopy are all normal.  Please continue your nexium once a day  Please continue loratadine once a day  Restart your nasal spray.  Try to stop throat clearing if possible. Use sugar free candy  Follow with Dr Lamonte Sakai in 12 months or sooner if you have any problems

## 2015-04-07 NOTE — Telephone Encounter (Signed)
lmtcb for pt.  

## 2015-04-07 NOTE — Assessment & Plan Note (Signed)
Your breathing tests, CT scan and bronchoscopy are all normal.  Please continue your nexium once a day  Please continue loratadine once a day  Restart your nasal spray.  Try to stop throat clearing if possible. Use sugar free candy  Follow with Dr Lamonte Sakai in 12 months or sooner if you have any problems

## 2015-04-08 ENCOUNTER — Ambulatory Visit: Payer: Medicare Other | Admitting: Internal Medicine

## 2015-04-08 NOTE — Telephone Encounter (Signed)
Notes Recorded by Collene Gobble, MD on 04/06/2015 at 6:16 PM Please let pt know that her cultures are all now final and are negative. Thanks --------------------- Pt aware of results.  Nothing further needed.

## 2015-05-02 ENCOUNTER — Encounter: Payer: Self-pay | Admitting: Emergency Medicine

## 2015-05-02 ENCOUNTER — Telehealth: Payer: Self-pay | Admitting: Emergency Medicine

## 2015-05-02 NOTE — Telephone Encounter (Signed)
Called and spoke to pt. Pt stated she needs a letter for insurance purposes with a letterhead stating she had a bronch on 02/14/2015 by RB at [facility]. Letter generated and placed in outgoing mail. Pt verbalized understanding and denied any further questions or concerns at this time.

## 2015-05-13 ENCOUNTER — Encounter: Payer: Self-pay | Admitting: Internal Medicine

## 2015-05-13 ENCOUNTER — Ambulatory Visit (INDEPENDENT_AMBULATORY_CARE_PROVIDER_SITE_OTHER): Payer: Medicare Other | Admitting: Internal Medicine

## 2015-05-13 VITALS — BP 110/68 | HR 70 | Ht 65.0 in | Wt 180.0 lb

## 2015-05-13 DIAGNOSIS — R059 Cough, unspecified: Secondary | ICD-10-CM

## 2015-05-13 DIAGNOSIS — R05 Cough: Secondary | ICD-10-CM | POA: Diagnosis not present

## 2015-05-13 DIAGNOSIS — K219 Gastro-esophageal reflux disease without esophagitis: Secondary | ICD-10-CM

## 2015-05-13 DIAGNOSIS — R490 Dysphonia: Secondary | ICD-10-CM | POA: Diagnosis not present

## 2015-05-13 NOTE — Progress Notes (Signed)
   Subjective:    Patient ID: Frances Brown, female    DOB: 08/24/1946, 69 y.o.   MRN: [id number] Chief complaint: Hoarseness, reflux, cough HPI Patient returns after upper endoscopy was performed to evaluate reflux issues. That was performed on April 6. She had fundic gland polyps of a 3 cm hiatal hernia. I thought her larynx looked okay. I put her on twice a day PPI. Nexium. She wasn't sure that is helping and when she saw Dr. Lamonte Sakai she went back to once a day. She still little hoarse and occasionally coughs still. She seems to think that if she clears her throat or cough then she'll have problems again. She says she has looked up things on the Internet and that Januvia or Janumet can cause these problems. She feels like she is having more postnasal drainage and allergy problems and is started an over-the-counter allergy medication. She is not sure what the cause is that wonders if the Janumet could be causing it. She says that time line is consistent with that i.e. that she initiated that and then the symptoms developed. She is trying to maintain a reflux diet. Medications, allergies, past medical history, past surgical history, family history and social history are reviewed and updated in the EMR.   Review of Systems As per history of present illness    Objective:   Physical Exam BP 110/68 mmHg  Pulse 70  Ht 5\' 5"  (1.651 m)  Wt 180 lb (81.647 kg)  BMI 29.95 kg/m2 Overweight elderly woman in no acute distress     Assessment & Plan:   1. Hoarseness   2. Cough   3. Gastroesophageal reflux disease without esophagitis    I looked up her claim, and in [place] Januvia can cause a runny nose, sore throat and hoarseness and cough. I have advised her to stop her Janumet and see if that helps and the report back to me in about 2 weeks. She's getting a new primary care physician Dr. Noah Delaine and I advised her to let him know about our theory and plans as well. She is seeing him next week.  I  appreciate the opportunity to care for this patient. CC: Thressa Sheller, MD

## 2015-05-13 NOTE — Patient Instructions (Addendum)
   Dr. Carlean Purl wants you to hold your Janumet and call us with a symptom update in 2 weeks.  We will route today's note to your PCP to let him know.  Today you have been given a handout to read and follow on GERD.   I appreciate the opportunity to care for you. Silvano Rusk, M.D., [facility]

## 2015-05-20 ENCOUNTER — Telehealth: Payer: Self-pay | Admitting: Internal Medicine

## 2015-05-20 NOTE — Telephone Encounter (Signed)
Patient asked that I send notes to her PCP from the last office visit Notes faxed

## 2015-05-29 ENCOUNTER — Encounter: Payer: Self-pay | Admitting: Internal Medicine

## 2015-06-06 ENCOUNTER — Other Ambulatory Visit: Payer: Self-pay

## 2015-07-05 ENCOUNTER — Encounter: Payer: Self-pay | Admitting: Dietician

## 2015-07-05 ENCOUNTER — Encounter: Payer: Medicare Other | Attending: Internal Medicine | Admitting: Dietician

## 2015-07-05 VITALS — Ht 65.0 in | Wt 181.0 lb

## 2015-07-05 DIAGNOSIS — E119 Type 2 diabetes mellitus without complications: Secondary | ICD-10-CM | POA: Diagnosis present

## 2015-07-05 DIAGNOSIS — Z713 Dietary counseling and surveillance: Secondary | ICD-10-CM | POA: Diagnosis not present

## 2015-07-05 DIAGNOSIS — E669 Obesity, unspecified: Secondary | ICD-10-CM | POA: Diagnosis not present

## 2015-07-05 DIAGNOSIS — E118 Type 2 diabetes mellitus with unspecified complications: Secondary | ICD-10-CM

## 2015-07-05 DIAGNOSIS — Z6830 Body mass index (BMI) 30.0-30.9, adult: Secondary | ICD-10-CM | POA: Insufficient documentation

## 2015-07-05 NOTE — Patient Instructions (Addendum)
Consistent exercise is very important.  Aim for 30 minutes most days. Enquire about [employer] through [employer]. Consider decreasing fat such as mayo and margarine.  Consider using plain yogurt in your fruit salad. Consider truvia rather than sugar.  See recipes at [employer]. Aim for 2-3 carb choices per meal (30-45 grams) Aim for 0-1 carb choice per snack Consider Calorie [employer] app or .com

## 2015-07-05 NOTE — Progress Notes (Signed)
Diabetes Self-Management Education  Visit Type: First/Initial  Appt. Start Time: 0900 Appt. End Time: 0254  07/05/2015  [name], identified by name and date of birth, is a 69 y.o. female with a diagnosis of Diabetes: Type 2.  Other people present during visit:  Patient, Spouse/SO   Patient is concerned about her blood sugar control and its effect on her vision and kidneys.  HgbA1C 5.9-6.2% overall per patient.  She is having eye exams every 4 months.  She is unable to read small font or print on colored backgrounds.  She has also experienced GERD from unknown cause, HTN, and hyperlipidemia.  Hx of type 2 Diabetes for the past 3-4 years with CKD and protein in her urine per patient. She lives with her husband and they care for 71 year old grandson at times.  She stays quite active but does not get any regular exercise.    ASSESSMENT  Height 5\' 5"  (1.651 m), weight 181 lb (82.101 kg). Body mass index is 30.12 kg/(m^2).  Initial Visit Information:  Are you currently following a meal plan?: Yes What type of meal plan do you follow?: lower carbohydrate Are you taking your medications as prescribed?: Yes Are you checking your feet?: Yes How many days per week are you checking your feet?: 7 How often do you need to have someone help you when you read instructions, pamphlets, or other written materials from your doctor or pharmacy?: 3 - Sometimes (due to vision) What is the last grade level you completed in school?: 12th grade HS and [employer]  Psychosocial:   Patient Belief/Attitude about Diabetes: Motivated to manage diabetes Self-care barriers: None Self-management support: Doctor's office, Family Other persons present: Patient, Spouse/SO Patient Concerns: Nutrition/Meal planning, Healthy Lifestyle, Weight Control Special Needs: None Preferred Learning Style: No preference indicated Learning Readiness: Ready  Complications:   Last HgB A1C per patient/outside  source: 6.2 mg/dL (April 2016 per patient.) How often do you check your blood sugar?: 1-2 times/day Fasting Blood glucose range (mg/dL): 70-129, 130-179 Number of hypoglycemic episodes per month: 0 Number of hyperglycemic episodes per week: 0 Have you had a dilated eye exam in the past 12 months?: Yes Have you had a dental exam in the past 12 months?: Yes  Diet Intake:  Breakfast: cheerios, blueberries and almond milk OR cottage cheese and fruit OR egg, toast, and fruit OR instant oatmeal (removing part of the sugar) or old fashioned  (8-9) Snack (morning): sometimes applesauce OR PB crackers OR fresh fruit Lunch: vegetables, salad, grilled salmon or chicken (2-3) Snack (afternoon): walnuts or almonds Dinner: simular to morning meal or leftovers from lunch. Beverage(s): water, unsweetened tea  Exercise:  Exercise: Light (walking / raking leaves) (difficult with the heat currenlty) Light Exercise amount of time (min / week): 120  Individualized Plan for Diabetes Self-Management Training:   Learning Objective:  Patient will have a greater understanding of diabetes self-management.  Patient education plan per assessed needs and concerns is to attend individual sessions for     Education Topics Reviewed with Patient Today:  Definition of diabetes, type 1 and 2, and the diagnosis of diabetes Role of diet in the treatment of diabetes and the relationship between the three main macronutrients and blood glucose level Role of exercise on diabetes management, blood pressure control and cardiac health., Helped patient identify appropriate exercises in relation to his/her diabetes, diabetes complications and other health issue.             Lifestyle  issues that need to be addressed for better diabetes care  PATIENTS GOALS/Plan (Developed by the patient):  Nutrition: General guidelines for healthy choices and portions discussed Physical Activity: Exercise 3-5 times per week, 15 minutes  per day, 30 minutes per day Monitoring : test my blood glucose as discussed (note x per day with comment)  Plan:   Patient Instructions  Consistent exercise is very important.  Aim for 30 minutes most days. Enquire about [employer] through [employer]. Consider decreasing fat such as mayo and margarine.  Consider using plain yogurt in your fruit salad. Consider truvia rather than sugar.  See recipes at GrandLunch[employer]it. Aim for 2-3 carb choices per meal (30-45 grams) Aim for 0-1 carb choice per snack Consider Calorie [employer] app or .com   Expected Outcomes:  Demonstrated interest in learning. Expect positive outcomes  Education material provided: Living Well with Diabetes, Food label handouts, Meal plan card and My Plate  If problems or questions, patient to contact team via:  Phone and Email  Future DSME appointment: 4-6 wks

## 2015-07-20 ENCOUNTER — Ambulatory Visit: Payer: Medicare Other | Admitting: Nutrition

## 2015-07-22 ENCOUNTER — Other Ambulatory Visit: Payer: Self-pay | Admitting: *Deleted

## 2015-07-25 ENCOUNTER — Telehealth: Payer: Self-pay | Admitting: Internal Medicine

## 2015-07-25 ENCOUNTER — Encounter: Payer: Self-pay | Admitting: Internal Medicine

## 2015-07-26 ENCOUNTER — Other Ambulatory Visit: Payer: Self-pay

## 2015-07-26 ENCOUNTER — Encounter: Payer: Self-pay | Admitting: Internal Medicine

## 2015-07-26 MED ORDER — ESOMEPRAZOLE MAGNESIUM 20 MG PO PACK: 20.0000 mg | PACK | Freq: Every day | ORAL | Status: DC

## 2015-07-26 NOTE — Telephone Encounter (Signed)
Called and gave rx to pharmacy verbally.

## 2015-07-26 NOTE — Telephone Encounter (Signed)
Reached patients voice mail.  LM that I see she has sent a MyChart message with more information.  I told her we will address that and get her answers.

## 2015-08-08 ENCOUNTER — Encounter: Payer: Self-pay | Admitting: Dietician

## 2015-08-08 ENCOUNTER — Encounter: Payer: Medicare Other | Attending: Internal Medicine | Admitting: Dietician

## 2015-08-08 VITALS — Wt 175.8 lb

## 2015-08-08 DIAGNOSIS — E669 Obesity, unspecified: Secondary | ICD-10-CM | POA: Diagnosis not present

## 2015-08-08 DIAGNOSIS — E119 Type 2 diabetes mellitus without complications: Secondary | ICD-10-CM | POA: Insufficient documentation

## 2015-08-08 DIAGNOSIS — Z713 Dietary counseling and surveillance: Secondary | ICD-10-CM | POA: Insufficient documentation

## 2015-08-08 DIAGNOSIS — E118 Type 2 diabetes mellitus with unspecified complications: Secondary | ICD-10-CM

## 2015-08-08 DIAGNOSIS — Z6830 Body mass index (BMI) 30.0-30.9, adult: Secondary | ICD-10-CM | POA: Diagnosis not present

## 2015-08-08 NOTE — Patient Instructions (Signed)
Consider consistent exercise.  Consider [employer] program (does your insurance cover this?) Be sure to eat a small meal in the evening. Your doing a great job with the changes that you have made!

## 2015-08-08 NOTE — Progress Notes (Signed)
Diabetes Self-Management Education  Visit Type:  Follow-up  Appt. Start Time: 0815 Appt. End Time: 0845  08/08/2015  [name], identified by name and date of birth, is a 69 y.o. female with a diagnosis of Diabetes: Type 2.    She is here with her husband.  She reports no new labs.  She reports that she is taking Metformin with some GI side effects.  Discussed taking this after she has eaten some food.  Patient states that she no longer is taking the Janumet as this upset her stomach too much.  Her main concern is her vision and need for continued good blood sugar control as well intolerance to many blood sugar medications.  She has lost 5 lbs since her last visit.  They continue to care for their 17 yo grandson on occasion.  Patient reports that her blood sugar tends to be higher in the am and lower after breakfast.  Recommended a light meal at night.  Add small amount of carbohydrate.  Add consistent exercise.  ASSESSMENT  Weight 175 lb 12.8 oz (79.742 kg). Body mass index is 29.25 kg/(m^2).       Diabetes Self-Management Education - 08/08/15 0857    Initial Visit   What type of meal plan do you follow? low carbohydrate   Health Coping   How would you rate your overall health? Good   Psychosocial Assessment   Patient Belief/Attitude about Diabetes Motivated to manage diabetes   Self-care barriers None   Self-management support Doctor's office;Family   Patient Concerns Nutrition/Meal planning   Special Needs None   Preferred Learning Style No preference indicated   Learning Readiness Ready   Complications   How often do you check your blood sugar? 1-2 times/day   Fasting Blood glucose range (mg/dL) 130-179   Postprandial Blood glucose range (mg/dL) 70-129   Number of hypoglycemic episodes per month 0   Number of hyperglycemic episodes per week 0   Have you had a dilated eye exam in the past 12 months? Yes   Have you had a dental exam in the past 12 months? Yes   Are you  checking your feet? Yes   How many days per week are you checking your feet? 7   Dietary Intake   Breakfast cottage cheese and fruit or fruit and boiled egg or egg McMuffin   Snack (morning) fruit and nuts or cheese   Lunch salad with greens, tuna, craisins   Snack (afternoon) fruit and nuts   Dinner hummus or leftovers from lunch, or cereal or simular to breakfast   Snack (evening) none   Beverage(s) water, unsweetened tea, almond milk   Exercise   Exercise Type Light (walking / raking leaves)   Patient Education   Previous Diabetes Education Yes (please comment)  1 month ago   Nutrition management  Food label reading, portion sizes and measuring food.;Reviewed blood glucose goals for pre and post meals and how to evaluate the patients' food intake on their blood glucose level.   Physical activity and exercise  Role of exercise on diabetes management, blood pressure control and cardiac health.;Helped patient identify appropriate exercises in relation to his/her diabetes, diabetes complications and other health issue.   Monitoring Taught/discussed recording of test results and interpretation of SMBG.;Identified appropriate SMBG and/or A1C goals.   Personal strategies to promote health Lifestyle issues that need to be addressed for better diabetes care   Individualized Goals (developed by patient)   Nutrition General guidelines for  healthy choices and portions discussed   Physical Activity Exercise 5-7 days per week;30 minutes per day   Medications take my medication as prescribed   Monitoring  test my blood glucose as discussed  1-2 times per day   Reducing Risk examine blood glucose patterns;increase portions of nuts and seeds   Patient Self-Evaluation of Goals - Patient rates self as meeting previously set goals (% of time)   Nutrition >75%   Physical Activity < 25%   Medications >75%   Monitoring >75%   Problem Solving >75%   Reducing Risk >75%   Health Coping >75%   Outcomes    Program Status Completed   Subsequent Visit   Since your last visit have you continued or begun to take your medications as prescribed? Yes   Since your last visit have you had your blood pressure checked? Yes   Is your most recent blood pressure lower, unchanged, or higher since your last visit? Unchanged   Since your last visit have you experienced any weight changes? Loss   Weight Loss (lbs) 5   Since your last visit, are you checking your blood glucose at least once a day? Yes      Learning Objective:  Patient will have a greater understanding of diabetes self-management. Patient education plan is to attend individual and/or group sessions per assessed needs and concerns.   Plan:   Patient Instructions  Consider consistent exercise.  Consider Silver [employer]neakers program (does your insurance cover this?) Be sure to eat a small meal in the evening. Your doing a great job with the changes that you have made!    Expected Outcomes:  Demonstrated interest in learning. Expect positive outcomes  Education material provided: Meal plan card and Snack sheet  If problems or questions, patient to contact team via:  Phone and Email  Future DSME appointment: - PRN

## 2015-12-13 DIAGNOSIS — M9905 Segmental and somatic dysfunction of pelvic region: Secondary | ICD-10-CM | POA: Diagnosis not present

## 2015-12-13 DIAGNOSIS — M545 Low back pain: Secondary | ICD-10-CM | POA: Diagnosis not present

## 2015-12-15 ENCOUNTER — Ambulatory Visit (INDEPENDENT_AMBULATORY_CARE_PROVIDER_SITE_OTHER): Payer: Medicare Other | Admitting: Internal Medicine

## 2015-12-15 ENCOUNTER — Encounter: Payer: Self-pay | Admitting: Internal Medicine

## 2015-12-15 VITALS — BP 132/80 | HR 64 | Ht 65.0 in | Wt 176.0 lb

## 2015-12-15 DIAGNOSIS — R197 Diarrhea, unspecified: Secondary | ICD-10-CM

## 2015-12-15 NOTE — Progress Notes (Signed)
   Subjective:    Patient ID: Frances Brown, female    DOB: 27-Aug-1946, 70 y.o.   MRN: [id number] Cc: diarrhea HPI Ms. Huebsch is here for follow-up. In the summer she was having hoarseness and nasal stuffiness that turned out to be from [place]. Subsequently she was started on metformin. He does have IBS and has had some intermittent loose stool issues, but since starting metformin her bowel movements are always loose and somewhat watery. There is no incontinence or extreme urgency but there is mild urgency it sounds like.She denies any rectal bleeding. No abdominal pain.  Wt Readings from Last 3 Encounters:  12/15/15 176 lb (79.833 kg)  08/08/15 175 lb 12.8 oz (79.742 kg)  07/05/15 181 lb (82.101 kg)   Medications, allergies, past medical history, past surgical history, family history and social history are reviewed and updated in the EMR.  Review of Systems As above she says hemoglobin A1c runs in the low sixes the high 5.    Objective:   Physical Exam @BP  132/80 mmHg  Pulse 64  Ht 5\' 5"  (1.651 m)  Wt 176 lb (79.833 kg)  BMI 29.29 kg/m2@  General:  NAD Eyes:   anicteric Lungs:  clear Heart:: S1S2 no rubs, murmurs or gallops Abdomen:  soft and nontender, BS+ Ext:   no edema, cyanosis or clubbing  Data Reviewed:  Previous GI notes Last colonoscopy 2008, diverticulosis in the left colon, performed by Dr. Rupert Stacks originally recommended in 8 year follow-up, she had some polyps cauterized and I reviewed this previously and recommended she go 10 years from the last one. So she is due for a repeat routine exam in 2018.     Assessment & Plan:   1. Diarrhea, unspecified type    I think this is from her metformin. She will hold that and call me back in a week with results of holding that. If there are remaining questions or concerns and I would pursue a colonoscopy for worsening diarrhea/change in bowel habits. It's been chronic, has not always watery though it usually is,  So a C.  Difficile PCR could be appropriate as well as prior to any colonoscopy.  I appreciate the opportunity to care for this patient.  15 minutes time spent with patient > half in counseling coordination of care CC: RAMACHANDRAN,AJITH, MD

## 2015-12-15 NOTE — Patient Instructions (Signed)
  Please hold your metformin and call us with a symptom update in a week.    I appreciate the opportunity to care for you. Silvano Rusk, MD, [facility]

## 2015-12-17 DIAGNOSIS — H353132 Nonexudative age-related macular degeneration, bilateral, intermediate dry stage: Secondary | ICD-10-CM | POA: Diagnosis not present

## 2015-12-20 DIAGNOSIS — H353132 Nonexudative age-related macular degeneration, bilateral, intermediate dry stage: Secondary | ICD-10-CM | POA: Diagnosis not present

## 2015-12-20 DIAGNOSIS — H43813 Vitreous degeneration, bilateral: Secondary | ICD-10-CM | POA: Diagnosis not present

## 2015-12-20 DIAGNOSIS — H43823 Vitreomacular adhesion, bilateral: Secondary | ICD-10-CM | POA: Diagnosis not present

## 2015-12-26 ENCOUNTER — Telehealth: Payer: Self-pay | Admitting: Internal Medicine

## 2015-12-26 DIAGNOSIS — E782 Mixed hyperlipidemia: Secondary | ICD-10-CM | POA: Diagnosis not present

## 2015-12-26 DIAGNOSIS — R635 Abnormal weight gain: Secondary | ICD-10-CM | POA: Diagnosis not present

## 2015-12-26 DIAGNOSIS — N951 Menopausal and female climacteric states: Secondary | ICD-10-CM | POA: Diagnosis not present

## 2015-12-26 DIAGNOSIS — E8881 Metabolic syndrome: Secondary | ICD-10-CM | POA: Diagnosis not present

## 2015-12-26 DIAGNOSIS — E119 Type 2 diabetes mellitus without complications: Secondary | ICD-10-CM | POA: Diagnosis not present

## 2015-12-26 NOTE — Telephone Encounter (Signed)
Patient said stopping the metformin has helped her loose stools, more solid now.  Her blood sugar today was 141, the highest this past week off the medicine was 150.  Usually it runs 131-136.  She thinks the stevia  also causes her to have loose stools. She went to see her eye Doctor last week for her AMD and he recommended she check into [facility] off [address] to lose weight.  Today she went for blood work and has a consult appointment on Friday.  Please advise Sir with regards to her metformin, thank you.

## 2015-12-27 NOTE — Telephone Encounter (Signed)
Patient informed and verbalized understanding

## 2015-12-27 NOTE — Telephone Encounter (Signed)
She should return to PCP to see how to treat DM and hopefully avoid side effects of meds.  I do not think we need to do a colonoscopy now unless diarrhea returns and not thought due to medication

## 2015-12-30 DIAGNOSIS — K219 Gastro-esophageal reflux disease without esophagitis: Secondary | ICD-10-CM | POA: Diagnosis not present

## 2015-12-30 DIAGNOSIS — E8881 Metabolic syndrome: Secondary | ICD-10-CM | POA: Diagnosis not present

## 2015-12-30 DIAGNOSIS — Z6830 Body mass index (BMI) 30.0-30.9, adult: Secondary | ICD-10-CM | POA: Diagnosis not present

## 2015-12-30 DIAGNOSIS — E119 Type 2 diabetes mellitus without complications: Secondary | ICD-10-CM | POA: Diagnosis not present

## 2015-12-30 DIAGNOSIS — E782 Mixed hyperlipidemia: Secondary | ICD-10-CM | POA: Diagnosis not present

## 2016-01-02 DIAGNOSIS — K219 Gastro-esophageal reflux disease without esophagitis: Secondary | ICD-10-CM | POA: Diagnosis not present

## 2016-01-02 DIAGNOSIS — E8881 Metabolic syndrome: Secondary | ICD-10-CM | POA: Diagnosis not present

## 2016-01-02 DIAGNOSIS — E119 Type 2 diabetes mellitus without complications: Secondary | ICD-10-CM | POA: Diagnosis not present

## 2016-01-02 DIAGNOSIS — Z6830 Body mass index (BMI) 30.0-30.9, adult: Secondary | ICD-10-CM | POA: Diagnosis not present

## 2016-01-02 DIAGNOSIS — E782 Mixed hyperlipidemia: Secondary | ICD-10-CM | POA: Diagnosis not present

## 2016-01-09 DIAGNOSIS — E559 Vitamin D deficiency, unspecified: Secondary | ICD-10-CM | POA: Diagnosis not present

## 2016-01-09 DIAGNOSIS — Z6830 Body mass index (BMI) 30.0-30.9, adult: Secondary | ICD-10-CM | POA: Diagnosis not present

## 2016-01-09 DIAGNOSIS — E782 Mixed hyperlipidemia: Secondary | ICD-10-CM | POA: Diagnosis not present

## 2016-01-09 DIAGNOSIS — E8881 Metabolic syndrome: Secondary | ICD-10-CM | POA: Diagnosis not present

## 2016-01-16 DIAGNOSIS — E663 Overweight: Secondary | ICD-10-CM | POA: Diagnosis not present

## 2016-01-16 DIAGNOSIS — H353132 Nonexudative age-related macular degeneration, bilateral, intermediate dry stage: Secondary | ICD-10-CM | POA: Diagnosis not present

## 2016-01-16 DIAGNOSIS — E8881 Metabolic syndrome: Secondary | ICD-10-CM | POA: Diagnosis not present

## 2016-01-16 DIAGNOSIS — E119 Type 2 diabetes mellitus without complications: Secondary | ICD-10-CM | POA: Diagnosis not present

## 2016-01-16 DIAGNOSIS — E782 Mixed hyperlipidemia: Secondary | ICD-10-CM | POA: Diagnosis not present

## 2016-01-16 DIAGNOSIS — K219 Gastro-esophageal reflux disease without esophagitis: Secondary | ICD-10-CM | POA: Diagnosis not present

## 2016-01-16 DIAGNOSIS — E559 Vitamin D deficiency, unspecified: Secondary | ICD-10-CM | POA: Diagnosis not present

## 2016-01-21 DIAGNOSIS — J101 Influenza due to other identified influenza virus with other respiratory manifestations: Secondary | ICD-10-CM | POA: Diagnosis not present

## 2016-01-21 DIAGNOSIS — Z20818 Contact with and (suspected) exposure to other bacterial communicable diseases: Secondary | ICD-10-CM | POA: Diagnosis not present

## 2016-01-23 DIAGNOSIS — K219 Gastro-esophageal reflux disease without esophagitis: Secondary | ICD-10-CM | POA: Diagnosis not present

## 2016-01-23 DIAGNOSIS — E782 Mixed hyperlipidemia: Secondary | ICD-10-CM | POA: Diagnosis not present

## 2016-01-23 DIAGNOSIS — E119 Type 2 diabetes mellitus without complications: Secondary | ICD-10-CM | POA: Diagnosis not present

## 2016-01-23 DIAGNOSIS — E8881 Metabolic syndrome: Secondary | ICD-10-CM | POA: Diagnosis not present

## 2016-01-23 DIAGNOSIS — E559 Vitamin D deficiency, unspecified: Secondary | ICD-10-CM | POA: Diagnosis not present

## 2016-01-23 DIAGNOSIS — Z6829 Body mass index (BMI) 29.0-29.9, adult: Secondary | ICD-10-CM | POA: Diagnosis not present

## 2016-01-26 DIAGNOSIS — H3530 Unspecified macular degeneration: Secondary | ICD-10-CM | POA: Diagnosis not present

## 2016-01-26 DIAGNOSIS — Z961 Presence of intraocular lens: Secondary | ICD-10-CM | POA: Diagnosis not present

## 2016-01-26 DIAGNOSIS — H524 Presbyopia: Secondary | ICD-10-CM | POA: Diagnosis not present

## 2016-01-26 DIAGNOSIS — H5203 Hypermetropia, bilateral: Secondary | ICD-10-CM | POA: Diagnosis not present

## 2016-01-27 DIAGNOSIS — E1122 Type 2 diabetes mellitus with diabetic chronic kidney disease: Secondary | ICD-10-CM | POA: Diagnosis not present

## 2016-01-27 DIAGNOSIS — E785 Hyperlipidemia, unspecified: Secondary | ICD-10-CM | POA: Diagnosis not present

## 2016-01-27 DIAGNOSIS — I10 Essential (primary) hypertension: Secondary | ICD-10-CM | POA: Diagnosis not present

## 2016-01-27 DIAGNOSIS — Z0000 Encounter for general adult medical examination without abnormal findings: Secondary | ICD-10-CM | POA: Diagnosis not present

## 2016-01-27 DIAGNOSIS — N183 Chronic kidney disease, stage 3 (moderate): Secondary | ICD-10-CM | POA: Diagnosis not present

## 2016-01-30 DIAGNOSIS — E8881 Metabolic syndrome: Secondary | ICD-10-CM | POA: Diagnosis not present

## 2016-01-30 DIAGNOSIS — Z6829 Body mass index (BMI) 29.0-29.9, adult: Secondary | ICD-10-CM | POA: Diagnosis not present

## 2016-01-30 DIAGNOSIS — E559 Vitamin D deficiency, unspecified: Secondary | ICD-10-CM | POA: Diagnosis not present

## 2016-01-30 DIAGNOSIS — E119 Type 2 diabetes mellitus without complications: Secondary | ICD-10-CM | POA: Diagnosis not present

## 2016-02-06 DIAGNOSIS — E8881 Metabolic syndrome: Secondary | ICD-10-CM | POA: Diagnosis not present

## 2016-02-06 DIAGNOSIS — E559 Vitamin D deficiency, unspecified: Secondary | ICD-10-CM | POA: Diagnosis not present

## 2016-02-06 DIAGNOSIS — E119 Type 2 diabetes mellitus without complications: Secondary | ICD-10-CM | POA: Diagnosis not present

## 2016-02-06 DIAGNOSIS — K219 Gastro-esophageal reflux disease without esophagitis: Secondary | ICD-10-CM | POA: Diagnosis not present

## 2016-02-07 NOTE — Progress Notes (Signed)
Chief Complaint  Patient presents with  . Follow-up      History of Present Illness: 70 yo female with history of HTN, HLD and palpitations felt to be due to PACs who is here today for follow up. I saw her in 2012 for evaluation of palpitations and leg pain. She told me that she has been under much stress at home. She developed a rash on her right arm from presumed poison oak. She was put on Prednisone and received IV steroids. She could feel her heart racing. She followed up with a dermatologist and this was felt to be related to contact with poison oak. I arranged a 48 Holter monitor and an echo. Her echo showed normal LV size and function. Her Holter monitor showed occasional premature atrial contractions but no VT or atrial fibrillation.   She is here today for follow up.  She is feeling well. No chest pain or SOB. She has rare palpitations.   Primary Care Physician: Tommy Medal  Last Lipid Profile: Followed in primary care  Past Medical History  Diagnosis Date  . Hypertension   . Hyperlipidemia   . Diverticulosis   . IBS (irritable bowel syndrome)   . Fibrocystic breast disease   . Fundic gland polyps of stomach, benign 03/24/2015  . Diabetes mellitus without complication [facility])     Past Surgical History  Procedure Laterality Date  . Cholecystectomy    . Partial hysterectomy    . Wisdom tooth extraction    . Breast lymph node removed      right  . Shoulder surgery      right  . Knee arthroscopy      left  . Colonoscopy  06/2002, 04/10/2007    diverticulosis, colon polyps, external hemorrhoids  . Upper gastrointestinal endoscopy  09/27/11  . Video bronchoscopy Bilateral 02/14/2015    Procedure: VIDEO BRONCHOSCOPY WITHOUT FLUORO;  Surgeon: Collene Gobble, MD;  Location: [facility];  Service: Cardiopulmonary;  Laterality: Bilateral;    Current Outpatient Prescriptions  Medication Sig Dispense Refill  . bisoprolol (ZEBETA) 10 MG tablet Take 10 mg by mouth daily.    .  Cholecalciferol (VITAMIN D3) 2000 UNITS TABS Take 1 capsule by mouth every other day.     . Coenzyme Q10 50 MG CAPS Take 1 capsule by mouth every other day.     . esomeprazole (NEXIUM) 20 MG packet Take 20 mg by mouth daily before breakfast. (Patient taking differently: Take 10 mg by mouth daily before breakfast. ) 30 each 12  . loratadine (CLARITIN) 10 MG tablet Take 10 mg by mouth daily as needed for allergies.     Marland Kitchen losartan (COZAAR) 25 MG tablet Take 25 mg by mouth daily.    . pravastatin (PRAVACHOL) 80 MG tablet Take 80 mg by mouth 2 (two) times daily.     . Multiple Vitamins-Minerals (PRESERVISION AREDS 2 PO) Take 2 tablets by mouth daily.     No current facility-administered medications for this visit.    Allergies  Allergen Reactions  . Trimethoprim     Rash / hives that are painful  . Sulfur Rash    Social History   Social History  . Marital Status: Married    Spouse Name: N/A  . Number of Children: N/A  . Years of Education: N/A   Occupational History  . Not on file.   Social History Main Topics  . Smoking status: Never Smoker   . Smokeless tobacco: Never Used  . Alcohol  Use: No  . Drug Use: No  . Sexual Activity: Yes    Birth Control/ Protection: Post-menopausal   Other Topics Concern  . Not on file   Social History Narrative    Family History  Problem Relation Age of Onset  . Diabetes Father   . Heart failure Father   . Hypertension Mother   . Heart failure Mother   . Diabetes Mother   . Lung cancer Mother   . Stroke Mother   . Leukemia      grandfather  . Cirrhosis      grandmother  . Hypertension Sister   . Colon cancer Neg Hx     Review of Systems:  As stated in the HPI and otherwise negative.   BP 120/68 mmHg  Pulse 64  Ht 5\' 5"  (1.651 m)  Wt 169 lb 12.8 oz (77.021 kg)  BMI 28.26 kg/m2  SpO2 98%  Physical Examination: General: Well developed, well nourished, NAD HEENT: OP clear, mucus membranes moist SKIN: warm, dry. No  rashes. Neuro: No focal deficits Musculoskeletal: Muscle strength 5/5 all ext Psychiatric: Mood and affect normal Neck: No JVD, no carotid bruits, no thyromegaly, no lymphadenopathy. Lungs:Clear bilaterally, no wheezes, rhonci, crackles Cardiovascular: Regular rate and rhythm. No murmurs, gallops or rubs. Abdomen:Soft. Bowel sounds present. Non-tender.  Extremities: No lower extremity edema. Pulses are 2 + in the bilateral DP/PT.  EKG:  EKG is ordered today. The ekg ordered today demonstrates NSR, rate 64 bpm.   Recent Labs: No results found for requested labs within last 365 days.    Wt Readings from Last 3 Encounters:  02/08/16 169 lb 12.8 oz (77.021 kg)  12/15/15 176 lb (79.833 kg)  08/08/15 175 lb 12.8 oz (79.742 kg)    Other studies Reviewed: Additional studies/ records that were reviewed today include: . Review of the above records demonstrates:   Assessment and Plan:   1. PALPITATIONS Controlled. Only occur rarely. Likely from PACs. Echo normal.   2. HTN: BP well controlled. No changes.   3. Hyperlipidemia:  Continue statin. Followed in primary care.   Current medicines are reviewed at length with the patient today.  The patient does not have concerns regarding medicines.  The following changes have been made:  no change  Labs/ tests ordered today include:   Orders Placed This Encounter  Procedures  . EKG 12-Lead     Disposition:   FU with me in 12  months   Signed, Lauree Chandler, MD 02/08/2016 10:38 AM    [facility] [place], [place], [place]  [address] Phone: [phone]; Fax: [phone]

## 2016-02-08 ENCOUNTER — Ambulatory Visit (INDEPENDENT_AMBULATORY_CARE_PROVIDER_SITE_OTHER): Payer: Medicare Other | Admitting: Cardiovascular Disease

## 2016-02-08 ENCOUNTER — Encounter: Payer: Self-pay | Admitting: Cardiovascular Disease

## 2016-02-08 VITALS — BP 120/68 | HR 64 | Ht 65.0 in | Wt 169.8 lb

## 2016-02-08 DIAGNOSIS — E785 Hyperlipidemia, unspecified: Secondary | ICD-10-CM

## 2016-02-08 DIAGNOSIS — I10 Essential (primary) hypertension: Secondary | ICD-10-CM

## 2016-02-08 DIAGNOSIS — R002 Palpitations: Secondary | ICD-10-CM

## 2016-02-08 NOTE — Patient Instructions (Signed)

## 2016-02-10 DIAGNOSIS — E785 Hyperlipidemia, unspecified: Secondary | ICD-10-CM | POA: Diagnosis not present

## 2016-02-10 DIAGNOSIS — N183 Chronic kidney disease, stage 3 (moderate): Secondary | ICD-10-CM | POA: Diagnosis not present

## 2016-02-10 DIAGNOSIS — E1122 Type 2 diabetes mellitus with diabetic chronic kidney disease: Secondary | ICD-10-CM | POA: Diagnosis not present

## 2016-02-10 DIAGNOSIS — I10 Essential (primary) hypertension: Secondary | ICD-10-CM | POA: Diagnosis not present

## 2016-02-13 DIAGNOSIS — Z6829 Body mass index (BMI) 29.0-29.9, adult: Secondary | ICD-10-CM | POA: Diagnosis not present

## 2016-02-13 DIAGNOSIS — E8881 Metabolic syndrome: Secondary | ICD-10-CM | POA: Diagnosis not present

## 2016-02-13 DIAGNOSIS — E119 Type 2 diabetes mellitus without complications: Secondary | ICD-10-CM | POA: Diagnosis not present

## 2016-02-13 DIAGNOSIS — K219 Gastro-esophageal reflux disease without esophagitis: Secondary | ICD-10-CM | POA: Diagnosis not present

## 2016-02-15 DIAGNOSIS — H353132 Nonexudative age-related macular degeneration, bilateral, intermediate dry stage: Secondary | ICD-10-CM | POA: Diagnosis not present

## 2016-02-20 DIAGNOSIS — L905 Scar conditions and fibrosis of skin: Secondary | ICD-10-CM | POA: Diagnosis not present

## 2016-02-20 DIAGNOSIS — E119 Type 2 diabetes mellitus without complications: Secondary | ICD-10-CM | POA: Diagnosis not present

## 2016-02-20 DIAGNOSIS — L821 Other seborrheic keratosis: Secondary | ICD-10-CM | POA: Diagnosis not present

## 2016-02-20 DIAGNOSIS — L812 Freckles: Secondary | ICD-10-CM | POA: Diagnosis not present

## 2016-02-20 DIAGNOSIS — Z6828 Body mass index (BMI) 28.0-28.9, adult: Secondary | ICD-10-CM | POA: Diagnosis not present

## 2016-02-20 DIAGNOSIS — E8881 Metabolic syndrome: Secondary | ICD-10-CM | POA: Diagnosis not present

## 2016-02-21 DIAGNOSIS — N183 Chronic kidney disease, stage 3 (moderate): Secondary | ICD-10-CM | POA: Diagnosis not present

## 2016-02-21 DIAGNOSIS — E1122 Type 2 diabetes mellitus with diabetic chronic kidney disease: Secondary | ICD-10-CM | POA: Diagnosis not present

## 2016-02-21 DIAGNOSIS — E785 Hyperlipidemia, unspecified: Secondary | ICD-10-CM | POA: Diagnosis not present

## 2016-02-27 DIAGNOSIS — E119 Type 2 diabetes mellitus without complications: Secondary | ICD-10-CM | POA: Diagnosis not present

## 2016-02-27 DIAGNOSIS — Z6828 Body mass index (BMI) 28.0-28.9, adult: Secondary | ICD-10-CM | POA: Diagnosis not present

## 2016-02-27 DIAGNOSIS — E8881 Metabolic syndrome: Secondary | ICD-10-CM | POA: Diagnosis not present

## 2016-02-27 DIAGNOSIS — K219 Gastro-esophageal reflux disease without esophagitis: Secondary | ICD-10-CM | POA: Diagnosis not present

## 2016-03-05 DIAGNOSIS — H353132 Nonexudative age-related macular degeneration, bilateral, intermediate dry stage: Secondary | ICD-10-CM | POA: Diagnosis not present

## 2016-03-05 DIAGNOSIS — Z6828 Body mass index (BMI) 28.0-28.9, adult: Secondary | ICD-10-CM | POA: Diagnosis not present

## 2016-03-05 DIAGNOSIS — H43813 Vitreous degeneration, bilateral: Secondary | ICD-10-CM | POA: Diagnosis not present

## 2016-03-05 DIAGNOSIS — H43823 Vitreomacular adhesion, bilateral: Secondary | ICD-10-CM | POA: Diagnosis not present

## 2016-03-05 DIAGNOSIS — E8881 Metabolic syndrome: Secondary | ICD-10-CM | POA: Diagnosis not present

## 2016-03-05 DIAGNOSIS — E119 Type 2 diabetes mellitus without complications: Secondary | ICD-10-CM | POA: Diagnosis not present

## 2016-03-05 DIAGNOSIS — K219 Gastro-esophageal reflux disease without esophagitis: Secondary | ICD-10-CM | POA: Diagnosis not present

## 2016-03-12 DIAGNOSIS — E8881 Metabolic syndrome: Secondary | ICD-10-CM | POA: Diagnosis not present

## 2016-03-12 DIAGNOSIS — E119 Type 2 diabetes mellitus without complications: Secondary | ICD-10-CM | POA: Diagnosis not present

## 2016-03-12 DIAGNOSIS — Z6828 Body mass index (BMI) 28.0-28.9, adult: Secondary | ICD-10-CM | POA: Diagnosis not present

## 2016-03-12 DIAGNOSIS — K219 Gastro-esophageal reflux disease without esophagitis: Secondary | ICD-10-CM | POA: Diagnosis not present

## 2016-03-16 DIAGNOSIS — H353132 Nonexudative age-related macular degeneration, bilateral, intermediate dry stage: Secondary | ICD-10-CM | POA: Diagnosis not present

## 2016-03-19 DIAGNOSIS — K219 Gastro-esophageal reflux disease without esophagitis: Secondary | ICD-10-CM | POA: Diagnosis not present

## 2016-03-19 DIAGNOSIS — E8881 Metabolic syndrome: Secondary | ICD-10-CM | POA: Diagnosis not present

## 2016-03-19 DIAGNOSIS — Z6828 Body mass index (BMI) 28.0-28.9, adult: Secondary | ICD-10-CM | POA: Diagnosis not present

## 2016-03-19 DIAGNOSIS — E119 Type 2 diabetes mellitus without complications: Secondary | ICD-10-CM | POA: Diagnosis not present

## 2016-03-26 DIAGNOSIS — E8881 Metabolic syndrome: Secondary | ICD-10-CM | POA: Diagnosis not present

## 2016-03-26 DIAGNOSIS — Z6828 Body mass index (BMI) 28.0-28.9, adult: Secondary | ICD-10-CM | POA: Diagnosis not present

## 2016-03-26 DIAGNOSIS — E119 Type 2 diabetes mellitus without complications: Secondary | ICD-10-CM | POA: Diagnosis not present

## 2016-03-27 DIAGNOSIS — M9903 Segmental and somatic dysfunction of lumbar region: Secondary | ICD-10-CM | POA: Diagnosis not present

## 2016-03-27 DIAGNOSIS — M47816 Spondylosis without myelopathy or radiculopathy, lumbar region: Secondary | ICD-10-CM | POA: Diagnosis not present

## 2016-03-27 DIAGNOSIS — M5442 Lumbago with sciatica, left side: Secondary | ICD-10-CM | POA: Diagnosis not present

## 2016-03-29 DIAGNOSIS — M5442 Lumbago with sciatica, left side: Secondary | ICD-10-CM | POA: Diagnosis not present

## 2016-03-29 DIAGNOSIS — M47816 Spondylosis without myelopathy or radiculopathy, lumbar region: Secondary | ICD-10-CM | POA: Diagnosis not present

## 2016-03-29 DIAGNOSIS — M9903 Segmental and somatic dysfunction of lumbar region: Secondary | ICD-10-CM | POA: Diagnosis not present

## 2016-04-02 DIAGNOSIS — M9903 Segmental and somatic dysfunction of lumbar region: Secondary | ICD-10-CM | POA: Diagnosis not present

## 2016-04-02 DIAGNOSIS — M47816 Spondylosis without myelopathy or radiculopathy, lumbar region: Secondary | ICD-10-CM | POA: Diagnosis not present

## 2016-04-02 DIAGNOSIS — M5442 Lumbago with sciatica, left side: Secondary | ICD-10-CM | POA: Diagnosis not present

## 2016-04-03 DIAGNOSIS — E8881 Metabolic syndrome: Secondary | ICD-10-CM | POA: Diagnosis not present

## 2016-04-03 DIAGNOSIS — E119 Type 2 diabetes mellitus without complications: Secondary | ICD-10-CM | POA: Diagnosis not present

## 2016-04-03 DIAGNOSIS — K219 Gastro-esophageal reflux disease without esophagitis: Secondary | ICD-10-CM | POA: Diagnosis not present

## 2016-04-03 DIAGNOSIS — Z6828 Body mass index (BMI) 28.0-28.9, adult: Secondary | ICD-10-CM | POA: Diagnosis not present

## 2016-04-04 DIAGNOSIS — M47816 Spondylosis without myelopathy or radiculopathy, lumbar region: Secondary | ICD-10-CM | POA: Diagnosis not present

## 2016-04-04 DIAGNOSIS — M9903 Segmental and somatic dysfunction of lumbar region: Secondary | ICD-10-CM | POA: Diagnosis not present

## 2016-04-04 DIAGNOSIS — M5442 Lumbago with sciatica, left side: Secondary | ICD-10-CM | POA: Diagnosis not present

## 2016-04-06 DIAGNOSIS — M5442 Lumbago with sciatica, left side: Secondary | ICD-10-CM | POA: Diagnosis not present

## 2016-04-06 DIAGNOSIS — M9903 Segmental and somatic dysfunction of lumbar region: Secondary | ICD-10-CM | POA: Diagnosis not present

## 2016-04-06 DIAGNOSIS — M47816 Spondylosis without myelopathy or radiculopathy, lumbar region: Secondary | ICD-10-CM | POA: Diagnosis not present

## 2016-04-09 DIAGNOSIS — M5442 Lumbago with sciatica, left side: Secondary | ICD-10-CM | POA: Diagnosis not present

## 2016-04-09 DIAGNOSIS — M9903 Segmental and somatic dysfunction of lumbar region: Secondary | ICD-10-CM | POA: Diagnosis not present

## 2016-04-09 DIAGNOSIS — M47816 Spondylosis without myelopathy or radiculopathy, lumbar region: Secondary | ICD-10-CM | POA: Diagnosis not present

## 2016-04-10 DIAGNOSIS — E119 Type 2 diabetes mellitus without complications: Secondary | ICD-10-CM | POA: Diagnosis not present

## 2016-04-10 DIAGNOSIS — E8881 Metabolic syndrome: Secondary | ICD-10-CM | POA: Diagnosis not present

## 2016-04-10 DIAGNOSIS — Z6828 Body mass index (BMI) 28.0-28.9, adult: Secondary | ICD-10-CM | POA: Diagnosis not present

## 2016-04-13 DIAGNOSIS — M5442 Lumbago with sciatica, left side: Secondary | ICD-10-CM | POA: Diagnosis not present

## 2016-04-13 DIAGNOSIS — M9903 Segmental and somatic dysfunction of lumbar region: Secondary | ICD-10-CM | POA: Diagnosis not present

## 2016-04-13 DIAGNOSIS — M47816 Spondylosis without myelopathy or radiculopathy, lumbar region: Secondary | ICD-10-CM | POA: Diagnosis not present

## 2016-04-15 DIAGNOSIS — H353132 Nonexudative age-related macular degeneration, bilateral, intermediate dry stage: Secondary | ICD-10-CM | POA: Diagnosis not present

## 2016-04-16 DIAGNOSIS — M5442 Lumbago with sciatica, left side: Secondary | ICD-10-CM | POA: Diagnosis not present

## 2016-04-16 DIAGNOSIS — M9903 Segmental and somatic dysfunction of lumbar region: Secondary | ICD-10-CM | POA: Diagnosis not present

## 2016-04-16 DIAGNOSIS — M47816 Spondylosis without myelopathy or radiculopathy, lumbar region: Secondary | ICD-10-CM | POA: Diagnosis not present

## 2016-04-17 DIAGNOSIS — E8881 Metabolic syndrome: Secondary | ICD-10-CM | POA: Diagnosis not present

## 2016-04-17 DIAGNOSIS — Z6828 Body mass index (BMI) 28.0-28.9, adult: Secondary | ICD-10-CM | POA: Diagnosis not present

## 2016-04-17 DIAGNOSIS — K219 Gastro-esophageal reflux disease without esophagitis: Secondary | ICD-10-CM | POA: Diagnosis not present

## 2016-04-17 DIAGNOSIS — E119 Type 2 diabetes mellitus without complications: Secondary | ICD-10-CM | POA: Diagnosis not present

## 2016-04-18 DIAGNOSIS — M19072 Primary osteoarthritis, left ankle and foot: Secondary | ICD-10-CM | POA: Diagnosis not present

## 2016-04-18 DIAGNOSIS — M47816 Spondylosis without myelopathy or radiculopathy, lumbar region: Secondary | ICD-10-CM | POA: Diagnosis not present

## 2016-04-18 DIAGNOSIS — M2022 Hallux rigidus, left foot: Secondary | ICD-10-CM | POA: Diagnosis not present

## 2016-04-18 DIAGNOSIS — M5442 Lumbago with sciatica, left side: Secondary | ICD-10-CM | POA: Diagnosis not present

## 2016-04-18 DIAGNOSIS — M9903 Segmental and somatic dysfunction of lumbar region: Secondary | ICD-10-CM | POA: Diagnosis not present

## 2016-04-23 ENCOUNTER — Encounter (HOSPITAL_COMMUNITY): Payer: Self-pay | Admitting: Emergency Medicine

## 2016-04-23 ENCOUNTER — Emergency Department (HOSPITAL_COMMUNITY)
Admission: EM | Admit: 2016-04-23 | Discharge: 2016-04-23 | Disposition: A | Discharge status: Home / Self Care | Payer: Medicare Other | Attending: Emergency Medicine | Admitting: Emergency Medicine

## 2016-04-23 DIAGNOSIS — Y929 Unspecified place or not applicable: Secondary | ICD-10-CM | POA: Diagnosis not present

## 2016-04-23 DIAGNOSIS — E785 Hyperlipidemia, unspecified: Secondary | ICD-10-CM | POA: Diagnosis not present

## 2016-04-23 DIAGNOSIS — M9903 Segmental and somatic dysfunction of lumbar region: Secondary | ICD-10-CM | POA: Diagnosis not present

## 2016-04-23 DIAGNOSIS — I10 Essential (primary) hypertension: Secondary | ICD-10-CM | POA: Insufficient documentation

## 2016-04-23 DIAGNOSIS — S30860A Insect bite (nonvenomous) of lower back and pelvis, initial encounter: Secondary | ICD-10-CM | POA: Diagnosis not present

## 2016-04-23 DIAGNOSIS — E119 Type 2 diabetes mellitus without complications: Secondary | ICD-10-CM | POA: Insufficient documentation

## 2016-04-23 DIAGNOSIS — M5442 Lumbago with sciatica, left side: Secondary | ICD-10-CM | POA: Diagnosis not present

## 2016-04-23 DIAGNOSIS — Y999 Unspecified external cause status: Secondary | ICD-10-CM | POA: Insufficient documentation

## 2016-04-23 DIAGNOSIS — W57XXXA Bitten or stung by nonvenomous insect and other nonvenomous arthropods, initial encounter: Secondary | ICD-10-CM | POA: Diagnosis not present

## 2016-04-23 DIAGNOSIS — Y939 Activity, unspecified: Secondary | ICD-10-CM | POA: Diagnosis not present

## 2016-04-23 DIAGNOSIS — M47816 Spondylosis without myelopathy or radiculopathy, lumbar region: Secondary | ICD-10-CM | POA: Diagnosis not present

## 2016-04-23 DIAGNOSIS — Z79899 Other long term (current) drug therapy: Secondary | ICD-10-CM | POA: Insufficient documentation

## 2016-04-23 MED ORDER — DOXYCYCLINE HYCLATE 100 MG PO CAPS: 100.0000 mg | ORAL_CAPSULE | Freq: Two times a day (BID) | ORAL | Status: DC

## 2016-04-23 NOTE — Discharge Instructions (Signed)
Tick Bite Information Ticks are insects that attach themselves to the skin and draw blood for food. There are various types of ticks. Common types include wood ticks and deer ticks. Most ticks live in shrubs and grassy areas. Ticks can climb onto your body when you make contact with leaves or grass where the tick is waiting. The most common places on the body for ticks to attach themselves are the scalp, neck, armpits, waist, and groin. Most tick bites are harmless, but sometimes ticks carry germs that cause diseases. These germs can be spread to a person during the tick's feeding process. The chance of a disease spreading through a tick bite depends on:   The type of tick.  Time of year.   How long the tick is attached.   Geographic location.  HOW CAN YOU PREVENT TICK BITES? Take these steps to help prevent tick bites when you are outdoors:  Wear protective clothing. Long sleeves and long pants are best.   Wear white clothes so you can see ticks more easily.  Tuck your pant legs into your socks.   If walking on a trail, stay in the middle of the trail to avoid brushing against bushes.  Avoid walking through areas with long grass.  Put insect repellent on all exposed skin and along boot tops, pant legs, and sleeve cuffs.   Check clothing, hair, and skin repeatedly and before going inside.   Brush off any ticks that are not attached.  Take a shower or bath as soon as possible after being outdoors.  WHAT IS THE PROPER WAY TO REMOVE A TICK? Ticks should be removed as soon as possible to help prevent diseases caused by tick bites. 1. If latex gloves are available, put them on before trying to remove a tick.  2. Using fine-point tweezers, grasp the tick as close to the skin as possible. You may also use curved forceps or a tick removal tool. Grasp the tick as close to its head as possible. Avoid grasping the tick on its body. 3. Pull gently with steady upward pressure until  the tick lets go. Do not twist the tick or jerk it suddenly. This may break off the tick's head or mouth parts. 4. Do not squeeze or crush the tick's body. This could force disease-carrying fluids from the tick into your body.  5. After the tick is removed, wash the bite area and your hands with soap and water or other disinfectant such as alcohol. 6. Apply a small amount of antiseptic cream or ointment to the bite site.  7. Wash and disinfect any instruments that were used.  Do not try to remove a tick by applying a hot match, petroleum jelly, or fingernail polish to the tick. These methods do not work and may increase the chances of disease being spread from the tick bite.  WHEN SHOULD YOU SEEK MEDICAL CARE? Contact your health care provider if you are unable to remove a tick from your skin or if a part of the tick breaks off and is stuck in the skin.  After a tick bite, you need to be aware of signs and symptoms that could be related to diseases spread by ticks. Contact your health care provider if you develop any of the following in the days or weeks after the tick bite:  Unexplained fever.  Rash. A circular rash that appears days or weeks after the tick bite may indicate the possibility of Lyme disease. The rash may resemble   a target with a bull's-eye and may occur at a different part of your body than the tick bite.  Redness and swelling in the area of the tick bite.   Tender, swollen lymph glands.   Diarrhea.   Weight loss.   Cough.   Fatigue.   Muscle, joint, or bone pain.   Abdominal pain.   Headache.   Lethargy or a change in your level of consciousness.  Difficulty walking or moving your legs.   Numbness in the legs.   Paralysis.  Shortness of breath.   Confusion.   Repeated vomiting.    This information is not intended to replace advice given to you by your health care provider. Make sure you discuss any questions you have with your health  care provider.   Document Released: 11/23/2000 Document Revised: 12/17/2014 Document Reviewed: 05/06/2013 Elsevier Interactive Patient Education 2016 Elsevier Inc.  

## 2016-04-23 NOTE — ED Provider Notes (Signed)
CSN: [id number]     Arrival date & time 04/23/16  2134 History   First MD Initiated Contact with Patient 04/23/16 2155     Chief Complaint  Patient presents with  . Tick Removal     (Consider location/radiation/quality/duration/timing/severity/associated sxs/prior Treatment) The history is provided by the patient.   Frances Brown is a 70 y.o. female who presents to the ED with a tick bite to the right lower back. She reports that her husband tried to pull it off and left the head in. She is here to have it removed. She denies fever, headache or other problems at this time. Patient states that she has never had one that embedded in her skin before. She is requesting treatment with antibiotics because a friend of hers died with RMSF a few years ago and patient doesn't want to take any chances.    Past Medical History  Diagnosis Date  . Hypertension   . Hyperlipidemia   . Diverticulosis   . IBS (irritable bowel syndrome)   . Fibrocystic breast disease   . Fundic gland polyps of stomach, benign 03/24/2015  . Diabetes mellitus without complication [facility])    Past Surgical History  Procedure Laterality Date  . Cholecystectomy    . Partial hysterectomy    . Wisdom tooth extraction    . Breast lymph node removed      right  . Shoulder surgery      right  . Knee arthroscopy      left  . Colonoscopy  06/2002, 04/10/2007    diverticulosis, colon polyps, external hemorrhoids  . Upper gastrointestinal endoscopy  09/27/11  . Video bronchoscopy Bilateral 02/14/2015    Procedure: VIDEO BRONCHOSCOPY WITHOUT FLUORO;  Surgeon: Collene Gobble, MD;  Location: [facility];  Service: Cardiopulmonary;  Laterality: Bilateral;   Family History  Problem Relation Age of Onset  . Diabetes Father   . Heart failure Father   . Hypertension Mother   . Heart failure Mother   . Diabetes Mother   . Lung cancer Mother   . Stroke Mother   . Leukemia      grandfather  . Cirrhosis      grandmother  .  Hypertension Sister   . Colon cancer Neg Hx    Social History  Substance Use Topics  . Smoking status: Never Smoker   . Smokeless tobacco: Never Used  . Alcohol Use: No   OB History    No data available     Review of Systems Negative except as stated in HPI   Allergies  Trimethoprim and Sulfur  Home Medications   Prior to Admission medications   Medication Sig Start Date End Date Taking? Authorizing Provider  bisoprolol (ZEBETA) 10 MG tablet Take 10 mg by mouth daily.    Historical Provider, MD  Cholecalciferol (VITAMIN D3) 2000 UNITS TABS Take 1 capsule by mouth every other day.     Historical Provider, MD  Coenzyme Q10 50 MG CAPS Take 1 capsule by mouth every other day.     Historical Provider, MD  doxycycline (VIBRAMYCIN) 100 MG capsule Take 1 capsule (100 mg total) by mouth 2 (two) times daily. 04/23/16   Hope Bunnie Pion, NP  esomeprazole (NEXIUM) 20 MG packet Take 20 mg by mouth daily before breakfast. Patient taking differently: Take 10 mg by mouth daily before breakfast.  07/26/15   Gatha Mayer, MD  loratadine (CLARITIN) 10 MG tablet Take 10 mg by mouth daily as needed for allergies.  01/28/14   Collene Gobble, MD  losartan (COZAAR) 25 MG tablet Take 25 mg by mouth daily.    Historical Provider, MD  Multiple Vitamins-Minerals (PRESERVISION AREDS 2 PO) Take 2 tablets by mouth daily.    Historical Provider, MD  pravastatin (PRAVACHOL) 80 MG tablet Take 80 mg by mouth 2 (two) times daily.     Historical Provider, MD   BP 140/63 mmHg  Pulse 70  Temp(Src) 98.4 F (36.9 C) (Oral)  Resp 18  Ht 5\' 3"  (1.6 m)  Wt 73.483 kg  BMI 28.70 kg/m2  SpO2 96% Physical Exam  Constitutional: She is oriented to person, place, and time. She appears well-developed and well-nourished.  HENT:  Head: Normocephalic and atraumatic.  Eyes: EOM are normal.  Neck: Neck supple.  Cardiovascular: Normal rate.   Pulmonary/Chest: Effort normal.  Musculoskeletal: Normal range of motion.   Neurological: She is alert and oriented to person, place, and time. No cranial nerve deficit.  Skin: Skin is warm and dry.  Small red area to the right lower back with dark center noted.   Psychiatric: She has a normal mood and affect. Her behavior is normal.  Nursing note and vitals reviewed.   ED Course  Procedures (including critical care time) Tick Removal Area cleaned with alcohol Using sterile tweezers tiny head of tick removed   MDM  70 y.o. female with head of tick imbedded in right lower back stable for d/c after tick head removed. Will treat with Doxycycline per patient request. Discussed with the patient and all questioned fully answered. She will return if any problems arise.   Final diagnoses:  Tick bite        [facility], NP 04/23/16 2238  Milton Ferguson, MD 04/24/16 [phone]

## 2016-04-23 NOTE — ED Notes (Signed)
Patient reports found a tick on her back today. Reports husband attempted to pull tick off, but she thinks part of it is still attached.

## 2016-04-24 DIAGNOSIS — Z6828 Body mass index (BMI) 28.0-28.9, adult: Secondary | ICD-10-CM | POA: Diagnosis not present

## 2016-04-24 DIAGNOSIS — E8881 Metabolic syndrome: Secondary | ICD-10-CM | POA: Diagnosis not present

## 2016-04-24 DIAGNOSIS — K219 Gastro-esophageal reflux disease without esophagitis: Secondary | ICD-10-CM | POA: Diagnosis not present

## 2016-04-24 DIAGNOSIS — E119 Type 2 diabetes mellitus without complications: Secondary | ICD-10-CM | POA: Diagnosis not present

## 2016-04-25 DIAGNOSIS — M79605 Pain in left leg: Secondary | ICD-10-CM | POA: Diagnosis not present

## 2016-04-25 DIAGNOSIS — M47816 Spondylosis without myelopathy or radiculopathy, lumbar region: Secondary | ICD-10-CM | POA: Diagnosis not present

## 2016-04-25 DIAGNOSIS — M79604 Pain in right leg: Secondary | ICD-10-CM | POA: Diagnosis not present

## 2016-04-25 DIAGNOSIS — M5442 Lumbago with sciatica, left side: Secondary | ICD-10-CM | POA: Diagnosis not present

## 2016-04-25 DIAGNOSIS — M9903 Segmental and somatic dysfunction of lumbar region: Secondary | ICD-10-CM | POA: Diagnosis not present

## 2016-04-26 DIAGNOSIS — M791 Myalgia: Secondary | ICD-10-CM | POA: Diagnosis not present

## 2016-04-26 DIAGNOSIS — E1122 Type 2 diabetes mellitus with diabetic chronic kidney disease: Secondary | ICD-10-CM | POA: Diagnosis not present

## 2016-05-03 DIAGNOSIS — M5442 Lumbago with sciatica, left side: Secondary | ICD-10-CM | POA: Diagnosis not present

## 2016-05-03 DIAGNOSIS — M47816 Spondylosis without myelopathy or radiculopathy, lumbar region: Secondary | ICD-10-CM | POA: Diagnosis not present

## 2016-05-03 DIAGNOSIS — M9903 Segmental and somatic dysfunction of lumbar region: Secondary | ICD-10-CM | POA: Diagnosis not present

## 2016-05-09 DIAGNOSIS — Z6828 Body mass index (BMI) 28.0-28.9, adult: Secondary | ICD-10-CM | POA: Diagnosis not present

## 2016-05-09 DIAGNOSIS — K219 Gastro-esophageal reflux disease without esophagitis: Secondary | ICD-10-CM | POA: Diagnosis not present

## 2016-05-09 DIAGNOSIS — E8881 Metabolic syndrome: Secondary | ICD-10-CM | POA: Diagnosis not present

## 2016-05-09 DIAGNOSIS — E119 Type 2 diabetes mellitus without complications: Secondary | ICD-10-CM | POA: Diagnosis not present

## 2016-05-15 DIAGNOSIS — H353132 Nonexudative age-related macular degeneration, bilateral, intermediate dry stage: Secondary | ICD-10-CM | POA: Diagnosis not present

## 2016-05-16 DIAGNOSIS — H353132 Nonexudative age-related macular degeneration, bilateral, intermediate dry stage: Secondary | ICD-10-CM | POA: Diagnosis not present

## 2016-05-21 DIAGNOSIS — Z6828 Body mass index (BMI) 28.0-28.9, adult: Secondary | ICD-10-CM | POA: Diagnosis not present

## 2016-05-21 DIAGNOSIS — E119 Type 2 diabetes mellitus without complications: Secondary | ICD-10-CM | POA: Diagnosis not present

## 2016-05-21 DIAGNOSIS — E8881 Metabolic syndrome: Secondary | ICD-10-CM | POA: Diagnosis not present

## 2016-05-21 DIAGNOSIS — E782 Mixed hyperlipidemia: Secondary | ICD-10-CM | POA: Diagnosis not present

## 2016-05-23 DIAGNOSIS — M9903 Segmental and somatic dysfunction of lumbar region: Secondary | ICD-10-CM | POA: Diagnosis not present

## 2016-05-23 DIAGNOSIS — S335XXA Sprain of ligaments of lumbar spine, initial encounter: Secondary | ICD-10-CM | POA: Diagnosis not present

## 2016-05-23 DIAGNOSIS — M47816 Spondylosis without myelopathy or radiculopathy, lumbar region: Secondary | ICD-10-CM | POA: Diagnosis not present

## 2016-05-23 DIAGNOSIS — M5442 Lumbago with sciatica, left side: Secondary | ICD-10-CM | POA: Diagnosis not present

## 2016-06-01 DIAGNOSIS — N183 Chronic kidney disease, stage 3 (moderate): Secondary | ICD-10-CM | POA: Diagnosis not present

## 2016-06-01 DIAGNOSIS — E1122 Type 2 diabetes mellitus with diabetic chronic kidney disease: Secondary | ICD-10-CM | POA: Diagnosis not present

## 2016-06-01 DIAGNOSIS — E785 Hyperlipidemia, unspecified: Secondary | ICD-10-CM | POA: Diagnosis not present

## 2016-06-08 DIAGNOSIS — I10 Essential (primary) hypertension: Secondary | ICD-10-CM | POA: Diagnosis not present

## 2016-06-08 DIAGNOSIS — E1122 Type 2 diabetes mellitus with diabetic chronic kidney disease: Secondary | ICD-10-CM | POA: Diagnosis not present

## 2016-06-08 DIAGNOSIS — E785 Hyperlipidemia, unspecified: Secondary | ICD-10-CM | POA: Diagnosis not present

## 2016-06-08 DIAGNOSIS — N183 Chronic kidney disease, stage 3 (moderate): Secondary | ICD-10-CM | POA: Diagnosis not present

## 2016-06-14 DIAGNOSIS — E8881 Metabolic syndrome: Secondary | ICD-10-CM | POA: Diagnosis not present

## 2016-06-14 DIAGNOSIS — Z6828 Body mass index (BMI) 28.0-28.9, adult: Secondary | ICD-10-CM | POA: Diagnosis not present

## 2016-06-14 DIAGNOSIS — E782 Mixed hyperlipidemia: Secondary | ICD-10-CM | POA: Diagnosis not present

## 2016-06-15 DIAGNOSIS — H353132 Nonexudative age-related macular degeneration, bilateral, intermediate dry stage: Secondary | ICD-10-CM | POA: Diagnosis not present

## 2016-06-27 DIAGNOSIS — E119 Type 2 diabetes mellitus without complications: Secondary | ICD-10-CM | POA: Diagnosis not present

## 2016-06-27 DIAGNOSIS — E8881 Metabolic syndrome: Secondary | ICD-10-CM | POA: Diagnosis not present

## 2016-06-27 DIAGNOSIS — Z6828 Body mass index (BMI) 28.0-28.9, adult: Secondary | ICD-10-CM | POA: Diagnosis not present

## 2016-07-12 DIAGNOSIS — N390 Urinary tract infection, site not specified: Secondary | ICD-10-CM | POA: Diagnosis not present

## 2016-07-14 DIAGNOSIS — H353132 Nonexudative age-related macular degeneration, bilateral, intermediate dry stage: Secondary | ICD-10-CM | POA: Diagnosis not present

## 2016-07-24 DIAGNOSIS — E119 Type 2 diabetes mellitus without complications: Secondary | ICD-10-CM | POA: Diagnosis not present

## 2016-07-24 DIAGNOSIS — Z6828 Body mass index (BMI) 28.0-28.9, adult: Secondary | ICD-10-CM | POA: Diagnosis not present

## 2016-07-24 DIAGNOSIS — E8881 Metabolic syndrome: Secondary | ICD-10-CM | POA: Diagnosis not present

## 2016-07-26 DIAGNOSIS — Z1231 Encounter for screening mammogram for malignant neoplasm of breast: Secondary | ICD-10-CM | POA: Diagnosis not present

## 2016-08-09 DIAGNOSIS — E782 Mixed hyperlipidemia: Secondary | ICD-10-CM | POA: Diagnosis not present

## 2016-08-09 DIAGNOSIS — Z6828 Body mass index (BMI) 28.0-28.9, adult: Secondary | ICD-10-CM | POA: Diagnosis not present

## 2016-08-09 DIAGNOSIS — E8881 Metabolic syndrome: Secondary | ICD-10-CM | POA: Diagnosis not present

## 2016-08-13 DIAGNOSIS — H353132 Nonexudative age-related macular degeneration, bilateral, intermediate dry stage: Secondary | ICD-10-CM | POA: Diagnosis not present

## 2016-08-14 DIAGNOSIS — M47816 Spondylosis without myelopathy or radiculopathy, lumbar region: Secondary | ICD-10-CM | POA: Diagnosis not present

## 2016-08-14 DIAGNOSIS — M9903 Segmental and somatic dysfunction of lumbar region: Secondary | ICD-10-CM | POA: Diagnosis not present

## 2016-08-14 DIAGNOSIS — M5442 Lumbago with sciatica, left side: Secondary | ICD-10-CM | POA: Diagnosis not present

## 2016-08-15 DIAGNOSIS — N393 Stress incontinence (female) (male): Secondary | ICD-10-CM | POA: Diagnosis not present

## 2016-08-15 DIAGNOSIS — Z87440 Personal history of urinary (tract) infections: Secondary | ICD-10-CM | POA: Diagnosis not present

## 2016-08-15 DIAGNOSIS — N3941 Urge incontinence: Secondary | ICD-10-CM | POA: Diagnosis not present

## 2016-08-29 DIAGNOSIS — E119 Type 2 diabetes mellitus without complications: Secondary | ICD-10-CM | POA: Diagnosis not present

## 2016-08-29 DIAGNOSIS — H43823 Vitreomacular adhesion, bilateral: Secondary | ICD-10-CM | POA: Diagnosis not present

## 2016-08-29 DIAGNOSIS — H353132 Nonexudative age-related macular degeneration, bilateral, intermediate dry stage: Secondary | ICD-10-CM | POA: Diagnosis not present

## 2016-08-29 DIAGNOSIS — H43813 Vitreous degeneration, bilateral: Secondary | ICD-10-CM | POA: Diagnosis not present

## 2016-08-30 DIAGNOSIS — M7711 Lateral epicondylitis, right elbow: Secondary | ICD-10-CM | POA: Diagnosis not present

## 2016-09-05 DIAGNOSIS — E8881 Metabolic syndrome: Secondary | ICD-10-CM | POA: Diagnosis not present

## 2016-09-05 DIAGNOSIS — Z6829 Body mass index (BMI) 29.0-29.9, adult: Secondary | ICD-10-CM | POA: Diagnosis not present

## 2016-09-05 DIAGNOSIS — E119 Type 2 diabetes mellitus without complications: Secondary | ICD-10-CM | POA: Diagnosis not present

## 2016-09-12 DIAGNOSIS — H353132 Nonexudative age-related macular degeneration, bilateral, intermediate dry stage: Secondary | ICD-10-CM | POA: Diagnosis not present

## 2016-09-19 ENCOUNTER — Ambulatory Visit (INDEPENDENT_AMBULATORY_CARE_PROVIDER_SITE_OTHER): Payer: Self-pay | Admitting: Orthopedic Surgery

## 2016-10-03 DIAGNOSIS — E119 Type 2 diabetes mellitus without complications: Secondary | ICD-10-CM | POA: Diagnosis not present

## 2016-10-03 DIAGNOSIS — E8881 Metabolic syndrome: Secondary | ICD-10-CM | POA: Diagnosis not present

## 2016-10-03 DIAGNOSIS — Z6828 Body mass index (BMI) 28.0-28.9, adult: Secondary | ICD-10-CM | POA: Diagnosis not present

## 2016-10-08 DIAGNOSIS — M9903 Segmental and somatic dysfunction of lumbar region: Secondary | ICD-10-CM | POA: Diagnosis not present

## 2016-10-08 DIAGNOSIS — M47816 Spondylosis without myelopathy or radiculopathy, lumbar region: Secondary | ICD-10-CM | POA: Diagnosis not present

## 2016-10-08 DIAGNOSIS — M5442 Lumbago with sciatica, left side: Secondary | ICD-10-CM | POA: Diagnosis not present

## 2016-10-09 DIAGNOSIS — E119 Type 2 diabetes mellitus without complications: Secondary | ICD-10-CM | POA: Diagnosis not present

## 2016-10-09 DIAGNOSIS — H43823 Vitreomacular adhesion, bilateral: Secondary | ICD-10-CM | POA: Diagnosis not present

## 2016-10-09 DIAGNOSIS — H43813 Vitreous degeneration, bilateral: Secondary | ICD-10-CM | POA: Diagnosis not present

## 2016-10-09 DIAGNOSIS — H353132 Nonexudative age-related macular degeneration, bilateral, intermediate dry stage: Secondary | ICD-10-CM | POA: Diagnosis not present

## 2016-10-10 DIAGNOSIS — M5442 Lumbago with sciatica, left side: Secondary | ICD-10-CM | POA: Diagnosis not present

## 2016-10-10 DIAGNOSIS — M9903 Segmental and somatic dysfunction of lumbar region: Secondary | ICD-10-CM | POA: Diagnosis not present

## 2016-10-10 DIAGNOSIS — M47816 Spondylosis without myelopathy or radiculopathy, lumbar region: Secondary | ICD-10-CM | POA: Diagnosis not present

## 2016-10-12 DIAGNOSIS — M9903 Segmental and somatic dysfunction of lumbar region: Secondary | ICD-10-CM | POA: Diagnosis not present

## 2016-10-12 DIAGNOSIS — M5442 Lumbago with sciatica, left side: Secondary | ICD-10-CM | POA: Diagnosis not present

## 2016-10-12 DIAGNOSIS — M47816 Spondylosis without myelopathy or radiculopathy, lumbar region: Secondary | ICD-10-CM | POA: Diagnosis not present

## 2016-10-15 DIAGNOSIS — M5442 Lumbago with sciatica, left side: Secondary | ICD-10-CM | POA: Diagnosis not present

## 2016-10-15 DIAGNOSIS — M9903 Segmental and somatic dysfunction of lumbar region: Secondary | ICD-10-CM | POA: Diagnosis not present

## 2016-10-15 DIAGNOSIS — M47816 Spondylosis without myelopathy or radiculopathy, lumbar region: Secondary | ICD-10-CM | POA: Diagnosis not present

## 2016-10-15 DIAGNOSIS — S335XXA Sprain of ligaments of lumbar spine, initial encounter: Secondary | ICD-10-CM | POA: Diagnosis not present

## 2016-10-18 DIAGNOSIS — M5442 Lumbago with sciatica, left side: Secondary | ICD-10-CM | POA: Diagnosis not present

## 2016-10-18 DIAGNOSIS — M9903 Segmental and somatic dysfunction of lumbar region: Secondary | ICD-10-CM | POA: Diagnosis not present

## 2016-10-18 DIAGNOSIS — S335XXA Sprain of ligaments of lumbar spine, initial encounter: Secondary | ICD-10-CM | POA: Diagnosis not present

## 2016-10-18 DIAGNOSIS — M47816 Spondylosis without myelopathy or radiculopathy, lumbar region: Secondary | ICD-10-CM | POA: Diagnosis not present

## 2016-11-02 DIAGNOSIS — H33002 Unspecified retinal detachment with retinal break, left eye: Secondary | ICD-10-CM | POA: Diagnosis not present

## 2016-11-02 DIAGNOSIS — H353132 Nonexudative age-related macular degeneration, bilateral, intermediate dry stage: Secondary | ICD-10-CM | POA: Diagnosis not present

## 2016-11-02 DIAGNOSIS — E119 Type 2 diabetes mellitus without complications: Secondary | ICD-10-CM | POA: Diagnosis not present

## 2016-11-02 DIAGNOSIS — H43823 Vitreomacular adhesion, bilateral: Secondary | ICD-10-CM | POA: Diagnosis not present

## 2016-11-02 DIAGNOSIS — H43813 Vitreous degeneration, bilateral: Secondary | ICD-10-CM | POA: Diagnosis not present

## 2016-11-07 DIAGNOSIS — E538 Deficiency of other specified B group vitamins: Secondary | ICD-10-CM | POA: Diagnosis not present

## 2016-11-07 DIAGNOSIS — E785 Hyperlipidemia, unspecified: Secondary | ICD-10-CM | POA: Diagnosis not present

## 2016-11-07 DIAGNOSIS — H43813 Vitreous degeneration, bilateral: Secondary | ICD-10-CM | POA: Diagnosis not present

## 2016-11-07 DIAGNOSIS — N390 Urinary tract infection, site not specified: Secondary | ICD-10-CM | POA: Diagnosis not present

## 2016-11-07 DIAGNOSIS — H4312 Vitreous hemorrhage, left eye: Secondary | ICD-10-CM | POA: Diagnosis not present

## 2016-11-07 DIAGNOSIS — H353132 Nonexudative age-related macular degeneration, bilateral, intermediate dry stage: Secondary | ICD-10-CM | POA: Diagnosis not present

## 2016-11-07 DIAGNOSIS — H43823 Vitreomacular adhesion, bilateral: Secondary | ICD-10-CM | POA: Diagnosis not present

## 2016-11-07 DIAGNOSIS — E1122 Type 2 diabetes mellitus with diabetic chronic kidney disease: Secondary | ICD-10-CM | POA: Diagnosis not present

## 2016-11-08 DIAGNOSIS — H4312 Vitreous hemorrhage, left eye: Secondary | ICD-10-CM | POA: Diagnosis not present

## 2016-11-08 DIAGNOSIS — H4311 Vitreous hemorrhage, right eye: Secondary | ICD-10-CM | POA: Diagnosis not present

## 2016-11-16 DIAGNOSIS — N183 Chronic kidney disease, stage 3 (moderate): Secondary | ICD-10-CM | POA: Diagnosis not present

## 2016-11-16 DIAGNOSIS — E11319 Type 2 diabetes mellitus with unspecified diabetic retinopathy without macular edema: Secondary | ICD-10-CM | POA: Diagnosis not present

## 2016-11-16 DIAGNOSIS — E1122 Type 2 diabetes mellitus with diabetic chronic kidney disease: Secondary | ICD-10-CM | POA: Diagnosis not present

## 2016-11-16 DIAGNOSIS — E785 Hyperlipidemia, unspecified: Secondary | ICD-10-CM | POA: Diagnosis not present

## 2016-11-16 DIAGNOSIS — Z23 Encounter for immunization: Secondary | ICD-10-CM | POA: Diagnosis not present

## 2016-12-06 ENCOUNTER — Ambulatory Visit (INDEPENDENT_AMBULATORY_CARE_PROVIDER_SITE_OTHER): Payer: Self-pay

## 2016-12-06 ENCOUNTER — Ambulatory Visit (INDEPENDENT_AMBULATORY_CARE_PROVIDER_SITE_OTHER): Payer: Medicare Other | Admitting: Orthopaedic Surgery

## 2016-12-06 ENCOUNTER — Encounter (INDEPENDENT_AMBULATORY_CARE_PROVIDER_SITE_OTHER): Payer: Self-pay | Admitting: Orthopaedic Surgery

## 2016-12-06 VITALS — BP 130/71 | HR 67 | Resp 14 | Ht 64.0 in | Wt 162.0 lb

## 2016-12-06 DIAGNOSIS — M11261 Other chondrocalcinosis, right knee: Secondary | ICD-10-CM

## 2016-12-06 DIAGNOSIS — M25561 Pain in right knee: Secondary | ICD-10-CM | POA: Diagnosis not present

## 2016-12-06 DIAGNOSIS — M1711 Unilateral primary osteoarthritis, right knee: Secondary | ICD-10-CM | POA: Diagnosis not present

## 2016-12-06 MED ORDER — DICLOFENAC SODIUM 1 % TD GEL: 2.0000 g | Freq: Three times a day (TID) | TRANSDERMAL | 2 refills | Status: DC

## 2016-12-06 NOTE — Progress Notes (Signed)
Office Visit Note   Patient: Frances Brown           Date of Birth: [birth year]           MRN: [id number] Visit Date: 12/06/2016              Requested by: Merrilee Seashore, MD [address] [place], [place] [address] PCP: Merrilee Seashore, MD   Assessment & Plan: Visit Diagnoses:  1. Acute pain of right knee   2. Pseudogout of right knee   3. Unilateral primary osteoarthritis, right knee     Plan:  #1: At this time she does not want to have a corticosteroid injection because of her diabetes #2: Use of Voltaren gel prescription was given #3: Follow up for injection if requested   Return if symptoms worsen or fail to improve.   Orders:  Orders Placed This Encounter  Procedures  . XR Knee Complete 4 Views Right   Meds ordered this encounter  Medications  . diclofenac sodium (VOLTAREN) 1 % GEL    Sig: Apply 2 g topically 3 (three) times daily.    Dispense:  3 Tube    Refill:  2    Order Specific Question:   Supervising Provider    Answer:   Garald Balding [id number]      Procedures: No procedures performed   Clinical Data: No additional findings.   Subjective: Chief Complaint  Patient presents with  . Right Knee - Pain    Frances Brown is a very pleasant 70 year old white female who is seen today for evaluation of her right knee. She states that yesterday without any history of injury or trauma she started having some pain in the posterior aspect of her knee. She states that the felt puffy in the back of the knee. She was having some difficulty with walking and activities. She does have some stiffness and had been limping. She had put itself into a tub of Epsom salts water and started bending it and she had a little bit more motion today. She's also tried some type of cream from the chiropractor which she states is improving. Tylenol is also been helpful.  She is a diabetic and does not want to try any corticosteroid injections.      Review  of Systems  Constitutional: Negative.   HENT: Negative.   Respiratory: Negative.   Cardiovascular:       History of hypertension  Gastrointestinal:       History of GERD  Endocrine:       History of diabetes mellitus  Genitourinary: Negative.   Skin: Negative.   Neurological: Negative.   Hematological: Negative.   Psychiatric/Behavioral: Negative.      Objective: Vital Signs: BP 130/71 (BP Location: Right Arm, Patient Position: Sitting, Cuff Size: Normal)   Pulse 67   Resp 14   Ht 5\' 4"  (1.626 m)   Wt 162 lb (73.5 kg)   BMI 27.81 kg/m   Physical Exam  Constitutional: She is oriented to person, place, and time. She appears well-developed and well-nourished.  HENT:  Head: Normocephalic and atraumatic.  Eyes: EOM are normal. Pupils are equal, round, and reactive to light.  Neck:  No carotid bruits  Pulmonary/Chest: Effort normal.  Musculoskeletal:       Right knee: She exhibits effusion ( trace to 1+).  Neurological: She is alert and oriented to person, place, and time.  Skin: Skin is warm and dry.  Psychiatric: She has a normal mood  and affect. Her behavior is normal. Judgment and thought content normal.    Right Knee Exam   Tenderness  Right knee tenderness location: Posterior knee pain to palpation.  Range of Motion  Extension: 0  Flexion: 110   Tests  Drawer:       Anterior - negative      Other  Sensation: normal Pulse: present Swelling: mild Other tests: effusion ( trace to 1+) present  Comments:  No warmth or erythema. Tender to palpation the popliteal area. Baker's cyst palpable. No pain in the mid portion of the knee posteriorly. Crepitance with range of motion mild>      Specialty Comments:  No specialty comments available.  Imaging: No results found.   PMFS History: Patient Active Problem List   Diagnosis Date Noted  . Fundic gland polyps of stomach, benign 03/24/2015  . Hoarseness 03/11/2015  . Acute bronchitis 02/24/2015  .  Cough 01/28/2014  . HTN (hypertension) 12/27/2011  . Hyperlipidemia 12/27/2011  . Obesity 08/16/2011  . IBS (irritable bowel syndrome) 05/23/2011  . PALPITATIONS 12/21/2010  . Chronic left upper quadrant pain 08/08/2010   Past Medical History:  Diagnosis Date  . Diabetes mellitus without complication ([place])   . Diverticulosis   . Fibrocystic breast disease   . Fundic gland polyps of stomach, benign 03/24/2015  . Hyperlipidemia   . Hypertension   . IBS (irritable bowel syndrome)     Family History  Problem Relation Age of Onset  . Diabetes Father   . Heart failure Father   . Hypertension Mother   . Heart failure Mother   . Diabetes Mother   . Lung cancer Mother   . Stroke Mother   . Leukemia      grandfather  . Cirrhosis      grandmother  . Hypertension Sister   . Colon cancer Neg Hx     Past Surgical History:  Procedure Laterality Date  . breast lymph node removed     right  . CHOLECYSTECTOMY    . COLONOSCOPY  06/2002, 04/10/2007   diverticulosis, colon polyps, external hemorrhoids  . KNEE ARTHROSCOPY     left  . PARTIAL HYSTERECTOMY    . SHOULDER SURGERY     right  . UPPER GASTROINTESTINAL ENDOSCOPY  09/27/11  . VIDEO BRONCHOSCOPY Bilateral 02/14/2015   Procedure: VIDEO BRONCHOSCOPY WITHOUT FLUORO;  Surgeon: Collene Gobble, MD;  Location: [facility];  Service: Cardiopulmonary;  Laterality: Bilateral;  . WISDOM TOOTH EXTRACTION     Social History   Occupational History  . Not on file.   Social History Main Topics  . Smoking status: Never Smoker  . Smokeless tobacco: Never Used  . Alcohol use No  . Drug use: No  . Sexual activity: Yes    Birth control/ protection: Post-menopausal

## 2016-12-21 DIAGNOSIS — I10 Essential (primary) hypertension: Secondary | ICD-10-CM | POA: Diagnosis not present

## 2016-12-21 DIAGNOSIS — E113593 Type 2 diabetes mellitus with proliferative diabetic retinopathy without macular edema, bilateral: Secondary | ICD-10-CM | POA: Diagnosis not present

## 2016-12-21 DIAGNOSIS — E785 Hyperlipidemia, unspecified: Secondary | ICD-10-CM | POA: Diagnosis not present

## 2017-01-07 ENCOUNTER — Encounter (INDEPENDENT_AMBULATORY_CARE_PROVIDER_SITE_OTHER): Payer: Self-pay | Admitting: Orthopaedic Surgery

## 2017-01-07 ENCOUNTER — Ambulatory Visit (INDEPENDENT_AMBULATORY_CARE_PROVIDER_SITE_OTHER): Payer: Medicare Other | Admitting: Orthopaedic Surgery

## 2017-01-07 VITALS — Ht 64.0 in | Wt 162.0 lb

## 2017-01-07 DIAGNOSIS — M25521 Pain in right elbow: Secondary | ICD-10-CM

## 2017-01-07 NOTE — Progress Notes (Signed)
Office Visit Note   Patient: Frances Brown           Date of Birth: 1946-10-14           MRN: [id number] Visit Date: 01/07/2017              Requested by: Merrilee Seashore, MD [address] [place], [place] [address] PCP: Merrilee Seashore, MD   Assessment & Plan: Visit Diagnosis:right tennis elbow-recurrent  Plan: MRI right elbow. Long discussion regarding different treatment options for tennis elbow. We'll avoid repeat cortisone injection. We have also discussed physical therapy, tennis elbow splint and even surgery. We'll start with an MRI scan  Follow-Up Instructions: No Follow-up on file.   Orders:  No orders of the defined types were placed in this encounter.  No orders of the defined types were placed in this encounter.     Procedures: No procedures performed   Clinical Data: No additional findings.   Subjective: No chief complaint on file.   Pt presents with Right shoulder/elbow pain. Prior surgery in 2009, rotator cuff repair.  She states last visit she received 1/2 dose of cortisone due to her high bloods sugar. Pt states she feels more pain in the elbow region, no real trouble sleeping. Denies numbness and tingling in R arm.  Kindle was seen in September for evaluation of right elbow pain. At the time a cortisone injection was given and she had significant relief of her pain. Her blood sugars did elevate for a while. She has had recurrence of her pain to the point of compromise. She has difficulty holding a cup of coffee or even from playing with her grandchildren. Pain is localized along the lateral aspect of her right elbow with a burning sensation into the forearm. She denies numbness and tingling. She also was had a prior surgery of her right shoulder but is not experiencing any shoulder or cervical spine pain.  Review of Systems   Objective: Vital Signs: Ht 5\' 4"  (1.626 m)   Wt 162 lb (73.5 kg)   BMI 27.81 kg/m   Physical  Exam  Ortho Exam painless range of motion of cervical spine with no referred pain to shoulder or upper extremity. Negative impingement and empty can testing right shoulder. Good strength. Localized tenderness over the lateral lip condyle right elbow. Pain increased with grip in extension compared to flexion. Skin intact. Neurovascular exam intact.  Specialty Comments:  No specialty comments available.  Imaging: No results found.   PMFS History: Patient Active Problem List   Diagnosis Date Noted  . Fundic gland polyps of stomach, benign 03/24/2015  . Hoarseness 03/11/2015  . Acute bronchitis 02/24/2015  . Cough 01/28/2014  . HTN (hypertension) 12/27/2011  . Hyperlipidemia 12/27/2011  . Obesity 08/16/2011  . IBS (irritable bowel syndrome) 05/23/2011  . PALPITATIONS 12/21/2010  . Chronic left upper quadrant pain 08/08/2010   Past Medical History:  Diagnosis Date  . Diabetes mellitus without complication ([facility])   . Diverticulosis   . Fibrocystic breast disease   . Fundic gland polyps of stomach, benign 03/24/2015  . Hyperlipidemia   . Hypertension   . IBS (irritable bowel syndrome)     Family History  Problem Relation Age of Onset  . Diabetes Father   . Heart failure Father   . Hypertension Mother   . Heart failure Mother   . Diabetes Mother   . Lung cancer Mother   . Stroke Mother   . Leukemia  grandfather  . Cirrhosis      grandmother  . Hypertension Sister   . Colon cancer Neg Hx     Past Surgical History:  Procedure Laterality Date  . breast lymph node removed     right  . CHOLECYSTECTOMY    . COLONOSCOPY  06/2002, 04/10/2007   diverticulosis, colon polyps, external hemorrhoids  . KNEE ARTHROSCOPY     left  . PARTIAL HYSTERECTOMY    . SHOULDER SURGERY     right  . UPPER GASTROINTESTINAL ENDOSCOPY  09/27/11  . VIDEO BRONCHOSCOPY Bilateral 02/14/2015   Procedure: VIDEO BRONCHOSCOPY WITHOUT FLUORO;  Surgeon: Collene Gobble, MD;  Location: [facility];   Service: Cardiopulmonary;  Laterality: Bilateral;  . WISDOM TOOTH EXTRACTION     Social History   Occupational History  . Not on file.   Social History Main Topics  . Smoking status: Never Smoker  . Smokeless tobacco: Never Used  . Alcohol use No  . Drug use: No  . Sexual activity: Yes    Birth control/ protection: Post-menopausal

## 2017-01-08 ENCOUNTER — Telehealth (INDEPENDENT_AMBULATORY_CARE_PROVIDER_SITE_OTHER): Payer: Self-pay | Admitting: Orthopaedic Surgery

## 2017-01-08 DIAGNOSIS — M9903 Segmental and somatic dysfunction of lumbar region: Secondary | ICD-10-CM | POA: Diagnosis not present

## 2017-01-08 DIAGNOSIS — S335XXA Sprain of ligaments of lumbar spine, initial encounter: Secondary | ICD-10-CM | POA: Diagnosis not present

## 2017-01-08 DIAGNOSIS — M5442 Lumbago with sciatica, left side: Secondary | ICD-10-CM | POA: Diagnosis not present

## 2017-01-08 DIAGNOSIS — M47816 Spondylosis without myelopathy or radiculopathy, lumbar region: Secondary | ICD-10-CM | POA: Diagnosis not present

## 2017-01-08 NOTE — Telephone Encounter (Signed)
Called patient

## 2017-01-08 NOTE — Telephone Encounter (Signed)
Patient was seen yesterday and says she has a "sleeve" for her elbow and would like to know if that would be sufficient to use or does she need to come back to the office to get one? Please advise.

## 2017-01-08 NOTE — Telephone Encounter (Signed)
As above.

## 2017-01-08 NOTE — Telephone Encounter (Signed)
Sleeve  is fine if it works

## 2017-01-09 DIAGNOSIS — S335XXA Sprain of ligaments of lumbar spine, initial encounter: Secondary | ICD-10-CM | POA: Diagnosis not present

## 2017-01-09 DIAGNOSIS — M5442 Lumbago with sciatica, left side: Secondary | ICD-10-CM | POA: Diagnosis not present

## 2017-01-09 DIAGNOSIS — M9903 Segmental and somatic dysfunction of lumbar region: Secondary | ICD-10-CM | POA: Diagnosis not present

## 2017-01-09 DIAGNOSIS — M47816 Spondylosis without myelopathy or radiculopathy, lumbar region: Secondary | ICD-10-CM | POA: Diagnosis not present

## 2017-01-10 ENCOUNTER — Ambulatory Visit
Admission: RE | Admit: 2017-01-10 | Discharge: 2017-01-10 | Disposition: A | Discharge status: Home / Self Care | Payer: Medicare Other | Source: Ambulatory Visit | Attending: Orthopaedic Surgery | Admitting: Orthopaedic Surgery

## 2017-01-10 ENCOUNTER — Telehealth (INDEPENDENT_AMBULATORY_CARE_PROVIDER_SITE_OTHER): Payer: Self-pay | Admitting: Orthopaedic Surgery

## 2017-01-10 DIAGNOSIS — M25521 Pain in right elbow: Secondary | ICD-10-CM | POA: Diagnosis not present

## 2017-01-10 NOTE — Telephone Encounter (Signed)
Please advise 

## 2017-01-10 NOTE — Telephone Encounter (Signed)
Called with results of MRI

## 2017-01-10 NOTE — Telephone Encounter (Signed)
Patient wants to know if Dr. Durward Fortes can call her with her MRI results. Please call her cell

## 2017-01-11 DIAGNOSIS — S335XXA Sprain of ligaments of lumbar spine, initial encounter: Secondary | ICD-10-CM | POA: Diagnosis not present

## 2017-01-11 DIAGNOSIS — M47816 Spondylosis without myelopathy or radiculopathy, lumbar region: Secondary | ICD-10-CM | POA: Diagnosis not present

## 2017-01-11 DIAGNOSIS — M5442 Lumbago with sciatica, left side: Secondary | ICD-10-CM | POA: Diagnosis not present

## 2017-01-11 DIAGNOSIS — M9903 Segmental and somatic dysfunction of lumbar region: Secondary | ICD-10-CM | POA: Diagnosis not present

## 2017-01-15 DIAGNOSIS — M5442 Lumbago with sciatica, left side: Secondary | ICD-10-CM | POA: Diagnosis not present

## 2017-01-15 DIAGNOSIS — S335XXA Sprain of ligaments of lumbar spine, initial encounter: Secondary | ICD-10-CM | POA: Diagnosis not present

## 2017-01-15 DIAGNOSIS — M9903 Segmental and somatic dysfunction of lumbar region: Secondary | ICD-10-CM | POA: Diagnosis not present

## 2017-01-15 DIAGNOSIS — M47816 Spondylosis without myelopathy or radiculopathy, lumbar region: Secondary | ICD-10-CM | POA: Diagnosis not present

## 2017-01-17 DIAGNOSIS — M9901 Segmental and somatic dysfunction of cervical region: Secondary | ICD-10-CM | POA: Diagnosis not present

## 2017-01-17 DIAGNOSIS — M531 Cervicobrachial syndrome: Secondary | ICD-10-CM | POA: Diagnosis not present

## 2017-01-17 DIAGNOSIS — M47812 Spondylosis without myelopathy or radiculopathy, cervical region: Secondary | ICD-10-CM | POA: Diagnosis not present

## 2017-01-25 DIAGNOSIS — M11221 Other chondrocalcinosis, right elbow: Secondary | ICD-10-CM | POA: Diagnosis not present

## 2017-01-25 DIAGNOSIS — E113593 Type 2 diabetes mellitus with proliferative diabetic retinopathy without macular edema, bilateral: Secondary | ICD-10-CM | POA: Diagnosis not present

## 2017-01-25 DIAGNOSIS — Z0000 Encounter for general adult medical examination without abnormal findings: Secondary | ICD-10-CM | POA: Diagnosis not present

## 2017-01-25 DIAGNOSIS — E785 Hyperlipidemia, unspecified: Secondary | ICD-10-CM | POA: Diagnosis not present

## 2017-01-25 DIAGNOSIS — I10 Essential (primary) hypertension: Secondary | ICD-10-CM | POA: Diagnosis not present

## 2017-01-25 DIAGNOSIS — E1165 Type 2 diabetes mellitus with hyperglycemia: Secondary | ICD-10-CM | POA: Diagnosis not present

## 2017-01-25 DIAGNOSIS — Z1389 Encounter for screening for other disorder: Secondary | ICD-10-CM | POA: Diagnosis not present

## 2017-01-25 DIAGNOSIS — Z7984 Long term (current) use of oral hypoglycemic drugs: Secondary | ICD-10-CM | POA: Diagnosis not present

## 2017-02-14 DIAGNOSIS — L905 Scar conditions and fibrosis of skin: Secondary | ICD-10-CM | POA: Diagnosis not present

## 2017-02-14 DIAGNOSIS — D225 Melanocytic nevi of trunk: Secondary | ICD-10-CM | POA: Diagnosis not present

## 2017-02-14 DIAGNOSIS — L821 Other seborrheic keratosis: Secondary | ICD-10-CM | POA: Diagnosis not present

## 2017-02-14 DIAGNOSIS — L738 Other specified follicular disorders: Secondary | ICD-10-CM | POA: Diagnosis not present

## 2017-02-15 ENCOUNTER — Encounter: Payer: Self-pay | Admitting: Internal Medicine

## 2017-02-21 DIAGNOSIS — Z7984 Long term (current) use of oral hypoglycemic drugs: Secondary | ICD-10-CM | POA: Diagnosis not present

## 2017-02-21 DIAGNOSIS — E113593 Type 2 diabetes mellitus with proliferative diabetic retinopathy without macular edema, bilateral: Secondary | ICD-10-CM | POA: Diagnosis not present

## 2017-02-25 DIAGNOSIS — Z7984 Long term (current) use of oral hypoglycemic drugs: Secondary | ICD-10-CM | POA: Diagnosis not present

## 2017-02-25 DIAGNOSIS — E113593 Type 2 diabetes mellitus with proliferative diabetic retinopathy without macular edema, bilateral: Secondary | ICD-10-CM | POA: Diagnosis not present

## 2017-02-25 DIAGNOSIS — E785 Hyperlipidemia, unspecified: Secondary | ICD-10-CM | POA: Diagnosis not present

## 2017-02-25 DIAGNOSIS — I10 Essential (primary) hypertension: Secondary | ICD-10-CM | POA: Diagnosis not present

## 2017-03-04 DIAGNOSIS — M546 Pain in thoracic spine: Secondary | ICD-10-CM | POA: Diagnosis not present

## 2017-03-04 DIAGNOSIS — S39012A Strain of muscle, fascia and tendon of lower back, initial encounter: Secondary | ICD-10-CM | POA: Diagnosis not present

## 2017-03-04 DIAGNOSIS — M9901 Segmental and somatic dysfunction of cervical region: Secondary | ICD-10-CM | POA: Diagnosis not present

## 2017-03-04 DIAGNOSIS — M9902 Segmental and somatic dysfunction of thoracic region: Secondary | ICD-10-CM | POA: Diagnosis not present

## 2017-03-04 DIAGNOSIS — S134XXA Sprain of ligaments of cervical spine, initial encounter: Secondary | ICD-10-CM | POA: Diagnosis not present

## 2017-03-04 DIAGNOSIS — M9903 Segmental and somatic dysfunction of lumbar region: Secondary | ICD-10-CM | POA: Diagnosis not present

## 2017-03-04 DIAGNOSIS — M7711 Lateral epicondylitis, right elbow: Secondary | ICD-10-CM | POA: Diagnosis not present

## 2017-03-07 DIAGNOSIS — M9903 Segmental and somatic dysfunction of lumbar region: Secondary | ICD-10-CM | POA: Diagnosis not present

## 2017-03-07 DIAGNOSIS — M7711 Lateral epicondylitis, right elbow: Secondary | ICD-10-CM | POA: Diagnosis not present

## 2017-03-07 DIAGNOSIS — S39012A Strain of muscle, fascia and tendon of lower back, initial encounter: Secondary | ICD-10-CM | POA: Diagnosis not present

## 2017-03-07 DIAGNOSIS — M546 Pain in thoracic spine: Secondary | ICD-10-CM | POA: Diagnosis not present

## 2017-03-07 DIAGNOSIS — S134XXA Sprain of ligaments of cervical spine, initial encounter: Secondary | ICD-10-CM | POA: Diagnosis not present

## 2017-03-07 DIAGNOSIS — M9901 Segmental and somatic dysfunction of cervical region: Secondary | ICD-10-CM | POA: Diagnosis not present

## 2017-03-07 DIAGNOSIS — M9902 Segmental and somatic dysfunction of thoracic region: Secondary | ICD-10-CM | POA: Diagnosis not present

## 2017-03-11 DIAGNOSIS — M9903 Segmental and somatic dysfunction of lumbar region: Secondary | ICD-10-CM | POA: Diagnosis not present

## 2017-03-11 DIAGNOSIS — M9901 Segmental and somatic dysfunction of cervical region: Secondary | ICD-10-CM | POA: Diagnosis not present

## 2017-03-11 DIAGNOSIS — S39012A Strain of muscle, fascia and tendon of lower back, initial encounter: Secondary | ICD-10-CM | POA: Diagnosis not present

## 2017-03-11 DIAGNOSIS — S134XXA Sprain of ligaments of cervical spine, initial encounter: Secondary | ICD-10-CM | POA: Diagnosis not present

## 2017-03-11 DIAGNOSIS — M7711 Lateral epicondylitis, right elbow: Secondary | ICD-10-CM | POA: Diagnosis not present

## 2017-03-11 DIAGNOSIS — M546 Pain in thoracic spine: Secondary | ICD-10-CM | POA: Diagnosis not present

## 2017-03-11 DIAGNOSIS — M9902 Segmental and somatic dysfunction of thoracic region: Secondary | ICD-10-CM | POA: Diagnosis not present

## 2017-03-15 DIAGNOSIS — M9902 Segmental and somatic dysfunction of thoracic region: Secondary | ICD-10-CM | POA: Diagnosis not present

## 2017-03-15 DIAGNOSIS — S134XXA Sprain of ligaments of cervical spine, initial encounter: Secondary | ICD-10-CM | POA: Diagnosis not present

## 2017-03-15 DIAGNOSIS — M546 Pain in thoracic spine: Secondary | ICD-10-CM | POA: Diagnosis not present

## 2017-03-15 DIAGNOSIS — S39012A Strain of muscle, fascia and tendon of lower back, initial encounter: Secondary | ICD-10-CM | POA: Diagnosis not present

## 2017-03-15 DIAGNOSIS — M9901 Segmental and somatic dysfunction of cervical region: Secondary | ICD-10-CM | POA: Diagnosis not present

## 2017-03-15 DIAGNOSIS — M9903 Segmental and somatic dysfunction of lumbar region: Secondary | ICD-10-CM | POA: Diagnosis not present

## 2017-03-15 DIAGNOSIS — M7711 Lateral epicondylitis, right elbow: Secondary | ICD-10-CM | POA: Diagnosis not present

## 2017-03-20 ENCOUNTER — Ambulatory Visit (INDEPENDENT_AMBULATORY_CARE_PROVIDER_SITE_OTHER): Payer: Medicare Other | Admitting: Cardiovascular Disease

## 2017-03-20 ENCOUNTER — Encounter: Payer: Self-pay | Admitting: Cardiovascular Disease

## 2017-03-20 VITALS — BP 130/68 | HR 61 | Ht 64.0 in | Wt 170.8 lb

## 2017-03-20 DIAGNOSIS — R002 Palpitations: Secondary | ICD-10-CM | POA: Diagnosis not present

## 2017-03-20 DIAGNOSIS — E7800 Pure hypercholesterolemia, unspecified: Secondary | ICD-10-CM | POA: Diagnosis not present

## 2017-03-20 DIAGNOSIS — I10 Essential (primary) hypertension: Secondary | ICD-10-CM | POA: Diagnosis not present

## 2017-03-20 DIAGNOSIS — I491 Atrial premature depolarization: Secondary | ICD-10-CM

## 2017-03-20 DIAGNOSIS — R5383 Other fatigue: Secondary | ICD-10-CM

## 2017-03-20 NOTE — Progress Notes (Signed)
Chief Complaint  Patient presents with  . Follow-up     History of Present Illness: 71 yo female with history of HTN, HLD and palpitations felt to be due to PACs who is here today for follow up. I saw her in 2012 for evaluation of palpitations and leg pain in the setting of steroid use for poison ivy treatment. I arranged a 48 Holter monitor and an echo. Her echo showed normal LV size and function. Her Holter monitor showed occasional premature atrial contractions but no VT or atrial fibrillation.   She is here today for follow up.  She is feeling well. No chest pain or SOB. She describes feeling her heart race several times per week. No dizziness, near syncope or syncope. She also has fatigue. No LE edema.   Primary Care Physician: Leeroy Cha, MD  Past Medical History:  Diagnosis Date  . Diabetes mellitus without complication ([place])   . Diverticulosis   . Fibrocystic breast disease   . Fundic gland polyps of stomach, benign 03/24/2015  . Hyperlipidemia   . Hypertension   . IBS (irritable bowel syndrome)     Past Surgical History:  Procedure Laterality Date  . breast lymph node removed     right  . CHOLECYSTECTOMY    . COLONOSCOPY  06/2002, 04/10/2007   diverticulosis, colon polyps, external hemorrhoids  . KNEE ARTHROSCOPY     left  . PARTIAL HYSTERECTOMY    . SHOULDER SURGERY     right  . UPPER GASTROINTESTINAL ENDOSCOPY  09/27/11  . VIDEO BRONCHOSCOPY Bilateral 02/14/2015   Procedure: VIDEO BRONCHOSCOPY WITHOUT FLUORO;  Surgeon: Collene Gobble, MD;  Location: [facility];  Service: Cardiopulmonary;  Laterality: Bilateral;  . WISDOM TOOTH EXTRACTION      Current Outpatient Prescriptions  Medication Sig Dispense Refill  . bisoprolol (ZEBETA) 10 MG tablet Take 10 mg by mouth daily.    . Cholecalciferol (VITAMIN D3) 2000 UNITS TABS Take 1 capsule by mouth every other day.     . Coenzyme Q10 50 MG CAPS Take 1 capsule by mouth every other day.     . esomeprazole  (NEXIUM) 10 MG packet Take 10 mg by mouth daily.    Marland Kitchen loratadine (CLARITIN) 10 MG tablet Take 10 mg by mouth daily as needed for allergies.     Marland Kitchen losartan (COZAAR) 25 MG tablet Take 25 mg by mouth daily.    . Magnesium 250 MG TABS Take 250 mg by mouth daily.    . metFORMIN (GLUCOPHAGE) 500 MG tablet Take 500 mg by mouth 2 (two) times daily.     . Multiple Vitamins-Minerals (PRESERVISION AREDS 2 PO) Take 2 tablets by mouth daily.    . pravastatin (PRAVACHOL) 80 MG tablet Take 80 mg by mouth daily.     . vitamin B-12 (CYANOCOBALAMIN) 1000 MCG tablet Take 1,000 mcg by mouth daily.     No current facility-administered medications for this visit.     Allergies  Allergen Reactions  . Janumet Xr [Sitagliptin-Metformin Hcl Er] Swelling  . Zocor [Simvastatin] Swelling  . Sulfa Antibiotics   . Trimethoprim     Rash / hives that are painful  . Sulfur Rash    Social History   Social History  . Marital status: Married    Spouse name: N/A  . Number of children: N/A  . Years of education: N/A   Occupational History  . Not on file.   Social History Main Topics  . Smoking status: Never Smoker  .  Smokeless tobacco: Never Used  . Alcohol use No  . Drug use: No  . Sexual activity: Yes    Birth control/ protection: Post-menopausal   Other Topics Concern  . Not on file   Social History Narrative  . No narrative on file    Family History  Problem Relation Age of Onset  . Diabetes Father   . Heart failure Father   . Hypertension Mother   . Heart failure Mother   . Diabetes Mother   . Lung cancer Mother   . Stroke Mother   . Leukemia      grandfather  . Cirrhosis      grandmother  . Hypertension Sister   . Colon cancer Neg Hx     Review of Systems:  As stated in the HPI and otherwise negative.   BP 130/68   Pulse 61   Ht 5\' 4"  (1.626 m)   Wt 170 lb 12.8 oz (77.5 kg)   BMI 29.32 kg/m   Physical Examination: General: Well developed, well nourished, NAD  HEENT: OP  clear, mucus membranes moist  SKIN: warm, dry. No rashes. Neuro: No focal deficits  Musculoskeletal: Muscle strength 5/5 all ext  Psychiatric: Mood and affect normal  Neck: No JVD, no carotid bruits, no thyromegaly, no lymphadenopathy.  Lungs:Clear bilaterally, no wheezes, rhonci, crackles Cardiovascular: Regular rate and rhythm. No murmurs, gallops or rubs. Abdomen:Soft. Bowel sounds present. Non-tender.  Extremities: No lower extremity edema. Pulses are 2 + in the bilateral DP/PT.  EKG:  EKG is  ordered today. The ekg ordered today demonstrates NSR, rate 61 bpm  Recent Labs: No results found for requested labs within last 8760 hours.    Wt Readings from Last 3 Encounters:  03/20/17 170 lb 12.8 oz (77.5 kg)  01/07/17 162 lb (73.5 kg)  12/06/16 162 lb (73.5 kg)    Other studies Reviewed: Additional studies/ records that were reviewed today include: . Review of the above records demonstrates:   Assessment and Plan:   1. PALPITATIONS/PACs: Occurring more often. Will arrange 48 hour cardiac monitor. Will repeat echo to assess LV systolic function.    2. HTN: BP well controlled. No changes.   3. Hyperlipidemia:  Continue statin. Followed in primary care.   4. Fatigue: Will arrange echo to assess LV function  Current medicines are reviewed at length with the patient today.  The patient does not have concerns regarding medicines.  The following changes have been made:  no change  Labs/ tests ordered today include:   Orders Placed This Encounter  Procedures  . Holter monitor - 48 hour  . EKG 12-Lead  . ECHOCARDIOGRAM COMPLETE     Disposition:   FU with me in 12  months   Signed, Lauree Chandler, MD 03/20/2017 12:04 PM    [address], [place], [place]  [address] Phone: [phone]; Fax: [phone]

## 2017-03-20 NOTE — Patient Instructions (Signed)
Medication Instructions:  Your physician recommends that you continue on your current medications as directed. Please refer to the Current Medication list given to you today.   Labwork: none  Testing/Procedures: Your physician has requested that you have an echocardiogram. Echocardiography is a painless test that uses sound waves to create images of your heart. It provides your doctor with information about the size and shape of your heart and how well your heart's chambers and valves are working. This procedure takes approximately one hour. There are no restrictions for this procedure.  Your physician has recommended that you wear a holter monitor. Holter monitors are medical devices that record the heart's electrical activity. Doctors most often use these monitors to diagnose arrhythmias. Arrhythmias are problems with the speed or rhythm of the heartbeat. The monitor is a small, portable device. You can wear one while you do your normal daily activities. This is usually used to diagnose what is causing palpitations/syncope (passing out).    Follow-Up: Your physician recommends that you schedule a follow-up appointment in:12 months. Please call us in about 9 months to schedule this appointment.    Any Other Special Instructions Will Be Listed Below (If Applicable).     If you need a refill on your cardiac medications before your next appointment, please call your pharmacy.

## 2017-03-21 DIAGNOSIS — M9902 Segmental and somatic dysfunction of thoracic region: Secondary | ICD-10-CM | POA: Diagnosis not present

## 2017-03-21 DIAGNOSIS — M9901 Segmental and somatic dysfunction of cervical region: Secondary | ICD-10-CM | POA: Diagnosis not present

## 2017-03-21 DIAGNOSIS — M546 Pain in thoracic spine: Secondary | ICD-10-CM | POA: Diagnosis not present

## 2017-03-21 DIAGNOSIS — M7711 Lateral epicondylitis, right elbow: Secondary | ICD-10-CM | POA: Diagnosis not present

## 2017-03-21 DIAGNOSIS — M9903 Segmental and somatic dysfunction of lumbar region: Secondary | ICD-10-CM | POA: Diagnosis not present

## 2017-03-21 DIAGNOSIS — S134XXA Sprain of ligaments of cervical spine, initial encounter: Secondary | ICD-10-CM | POA: Diagnosis not present

## 2017-03-21 DIAGNOSIS — S39012A Strain of muscle, fascia and tendon of lower back, initial encounter: Secondary | ICD-10-CM | POA: Diagnosis not present

## 2017-03-27 DIAGNOSIS — E785 Hyperlipidemia, unspecified: Secondary | ICD-10-CM | POA: Diagnosis not present

## 2017-03-27 DIAGNOSIS — H3530 Unspecified macular degeneration: Secondary | ICD-10-CM | POA: Diagnosis not present

## 2017-03-27 DIAGNOSIS — E1169 Type 2 diabetes mellitus with other specified complication: Secondary | ICD-10-CM | POA: Diagnosis not present

## 2017-03-27 DIAGNOSIS — E669 Obesity, unspecified: Secondary | ICD-10-CM | POA: Diagnosis not present

## 2017-03-27 DIAGNOSIS — Z6829 Body mass index (BMI) 29.0-29.9, adult: Secondary | ICD-10-CM | POA: Diagnosis not present

## 2017-03-27 DIAGNOSIS — I10 Essential (primary) hypertension: Secondary | ICD-10-CM | POA: Diagnosis not present

## 2017-03-27 DIAGNOSIS — Z7984 Long term (current) use of oral hypoglycemic drugs: Secondary | ICD-10-CM | POA: Diagnosis not present

## 2017-03-29 ENCOUNTER — Ambulatory Visit (HOSPITAL_COMMUNITY): Payer: Medicare Other | Attending: Cardiology

## 2017-03-29 ENCOUNTER — Other Ambulatory Visit: Payer: Self-pay

## 2017-03-29 ENCOUNTER — Ambulatory Visit (INDEPENDENT_AMBULATORY_CARE_PROVIDER_SITE_OTHER): Payer: Medicare Other

## 2017-03-29 ENCOUNTER — Other Ambulatory Visit (HOSPITAL_COMMUNITY): Payer: Medicare Other

## 2017-03-29 DIAGNOSIS — I491 Atrial premature depolarization: Secondary | ICD-10-CM

## 2017-03-29 DIAGNOSIS — I361 Nonrheumatic tricuspid (valve) insufficiency: Secondary | ICD-10-CM | POA: Diagnosis not present

## 2017-03-29 DIAGNOSIS — R002 Palpitations: Secondary | ICD-10-CM

## 2017-03-29 DIAGNOSIS — R5383 Other fatigue: Secondary | ICD-10-CM | POA: Diagnosis not present

## 2017-03-29 DIAGNOSIS — I10 Essential (primary) hypertension: Secondary | ICD-10-CM

## 2017-03-29 DIAGNOSIS — I340 Nonrheumatic mitral (valve) insufficiency: Secondary | ICD-10-CM | POA: Insufficient documentation

## 2017-03-29 DIAGNOSIS — I5030 Unspecified diastolic (congestive) heart failure: Secondary | ICD-10-CM | POA: Diagnosis not present

## 2017-04-03 ENCOUNTER — Ambulatory Visit (AMBULATORY_SURGERY_CENTER): Payer: Self-pay | Admitting: *Deleted

## 2017-04-03 ENCOUNTER — Other Ambulatory Visit (HOSPITAL_COMMUNITY): Payer: Medicare Other

## 2017-04-03 VITALS — Ht 64.0 in | Wt 172.0 lb

## 2017-04-03 DIAGNOSIS — Z1211 Encounter for screening for malignant neoplasm of colon: Secondary | ICD-10-CM

## 2017-04-03 NOTE — Progress Notes (Signed)
Patient denies any allergies to eggs or soy. Patient denies any problems with anesthesia/sedation. pt states "hard to wake up after surgery". Patient denies any oxygen use at home and does not take any diet/weight loss medications. EMMI education assisgned to patient on colonoscopy, this was explained and instructions given to patient.

## 2017-04-08 DIAGNOSIS — R000 Tachycardia, unspecified: Secondary | ICD-10-CM

## 2017-04-08 HISTORY — DX: Tachycardia, unspecified: R00.0

## 2017-04-17 ENCOUNTER — Ambulatory Visit (AMBULATORY_SURGERY_CENTER): Payer: Medicare Other | Admitting: Internal Medicine

## 2017-04-17 ENCOUNTER — Encounter: Payer: Self-pay | Admitting: Internal Medicine

## 2017-04-17 VITALS — BP 152/74 | HR 59 | Temp 96.4°F | Resp 13 | Ht 64.0 in | Wt 172.0 lb

## 2017-04-17 DIAGNOSIS — K219 Gastro-esophageal reflux disease without esophagitis: Secondary | ICD-10-CM | POA: Diagnosis not present

## 2017-04-17 DIAGNOSIS — Z1212 Encounter for screening for malignant neoplasm of rectum: Secondary | ICD-10-CM

## 2017-04-17 DIAGNOSIS — E119 Type 2 diabetes mellitus without complications: Secondary | ICD-10-CM | POA: Diagnosis not present

## 2017-04-17 DIAGNOSIS — E669 Obesity, unspecified: Secondary | ICD-10-CM | POA: Diagnosis not present

## 2017-04-17 DIAGNOSIS — D120 Benign neoplasm of cecum: Secondary | ICD-10-CM | POA: Diagnosis not present

## 2017-04-17 DIAGNOSIS — D123 Benign neoplasm of transverse colon: Secondary | ICD-10-CM

## 2017-04-17 DIAGNOSIS — I10 Essential (primary) hypertension: Secondary | ICD-10-CM | POA: Diagnosis not present

## 2017-04-17 DIAGNOSIS — Z1211 Encounter for screening for malignant neoplasm of colon: Secondary | ICD-10-CM

## 2017-04-17 MED ORDER — SODIUM CHLORIDE 0.9 % IV SOLN: 500.0000 mL | INTRAVENOUS | Status: DC

## 2017-04-17 NOTE — Progress Notes (Signed)
Report to PACU, RN, vss, BBS= Clear.  

## 2017-04-17 NOTE — Progress Notes (Signed)
Called to room to assist during endoscopic procedure.  Patient ID and intended procedure confirmed with present staff. Received instructions for my participation in the procedure from the performing physician.  

## 2017-04-17 NOTE — Patient Instructions (Addendum)
I found and removed 4 polyps. All look benign.  You also have diverticulosis - thickened muscle rings and pouches in the colon wall. Please read the handout about this condition.  I will let you know pathology results and when to have another routine colonoscopy by mail and/or My Chart.  I appreciate the opportunity to care for you. Gatha Mayer, MD, FACG   YOU HAD AN ENDOSCOPIC PROCEDURE TODAY AT [facility]:   Refer to the procedure report that was given to you for any specific questions about what was found during the examination.  If the procedure report does not answer your questions, please call your gastroenterologist to clarify.  If you requested that your care partner not be given the details of your procedure findings, then the procedure report has been included in a sealed envelope for you to review at your convenience later.  YOU SHOULD EXPECT: Some feelings of bloating in the abdomen. Passage of more gas than usual.  Walking can help get rid of the air that was put into your GI tract during the procedure and reduce the bloating. If you had a lower endoscopy (such as a colonoscopy or flexible sigmoidoscopy) you may notice spotting of blood in your stool or on the toilet paper. If you underwent a bowel prep for your procedure, you may not have a normal bowel movement for a few days.  Please Note:  You might notice some irritation and congestion in your nose or some drainage.  This is from the oxygen used during your procedure.  There is no need for concern and it should clear up in a day or so.  SYMPTOMS TO REPORT IMMEDIATELY:   Following lower endoscopy (colonoscopy or flexible sigmoidoscopy):  Excessive amounts of blood in the stool  Significant tenderness or worsening of abdominal pains  Swelling of the abdomen that is new, acute  Fever of 100F or higher   For urgent or emergent issues, a gastroenterologist can be reached at any hour by calling  [phone].   DIET:  We do recommend a small meal at first, but then you may proceed to your regular diet.  Drink plenty of fluids but you should avoid alcoholic beverages for 24 hours.  ACTIVITY:  You should plan to take it easy for the rest of today and you should NOT DRIVE or use heavy machinery until tomorrow (because of the sedation medicines used during the test).    FOLLOW UP: Our staff will call the number listed on your records the next business day following your procedure to check on you and address any questions or concerns that you may have regarding the information given to you following your procedure. If we do not reach you, we will leave a message.  However, if you are feeling well and you are not experiencing any problems, there is no need to return our call.  We will assume that you have returned to your regular daily activities without incident.  If any biopsies were taken you will be contacted by phone or by letter within the next 1-3 weeks.  Please call us at [phone] if you have not heard about the biopsies in 3 weeks.    SIGNATURES/CONFIDENTIALITY: You and/or your care partner have signed paperwork which will be entered into your electronic medical record.  These signatures attest to the fact that that the information above on your After Visit Summary has been reviewed and is understood.  Full responsibility of  the confidentiality of this discharge information lies with you and/or your care-partner.  Read all of the handouts given to you by your recovery room nurse.

## 2017-04-17 NOTE — Op Note (Signed)
[facility] Patient Name: Frances Brown Procedure Date: 04/17/2017 7:24 AM MRN: [id number] Endoscopist: Gatha Mayer , MD Age: 71 Referring MD:  Date of Birth: 1946/01/20 Gender: Female Account #: [id number] Procedure:                Colonoscopy Indications:              Screening for colorectal malignant neoplasm, Last                            colonoscopy: 2007 Medicines:                Propofol per Anesthesia, Monitored Anesthesia Care Procedure:                Pre-Anesthesia Assessment:                           - Prior to the procedure, a History and Physical                            was performed, and patient medications and                            allergies were reviewed. The patient's tolerance of                            previous anesthesia was also reviewed. The risks                            and benefits of the procedure and the sedation                            options and risks were discussed with the patient.                            All questions were answered, and informed consent                            was obtained. Prior Anticoagulants: The patient has                            taken no previous anticoagulant or antiplatelet                            agents. ASA Grade Assessment: II - A patient with                            mild systemic disease. After reviewing the risks                            and benefits, the patient was deemed in                            satisfactory condition to undergo the procedure.  After obtaining informed consent, the colonoscope                            was passed under direct vision. Throughout the                            procedure, the patient's blood pressure, pulse, and                            oxygen saturations were monitored continuously. The                            Colonoscope was introduced through the anus and                            advanced to the the  cecum, identified by                            appendiceal orifice and ileocecal valve. The                            colonoscopy was performed without difficulty. The                            patient tolerated the procedure well. The quality                            of the bowel preparation was good. The ileocecal                            valve, appendiceal orifice, and rectum were                            photographed. The bowel preparation used was                            Miralax. Scope In: 7:42:56 AM Scope Out: 8:01:08 AM Scope Withdrawal Time: 0 hours 15 minutes 8 seconds  Total Procedure Duration: 0 hours 18 minutes 12 seconds  Findings:                 The perianal and digital rectal examinations were                            normal.                           A 10 mm polyp was found in the transverse colon.                            The polyp was flat. The polyp was removed with a                            cold snare. Resection and retrieval were complete.  Verification of patient identification for the                            specimen was done. Estimated blood loss was minimal.                           A 5 mm polyp was found in the transverse colon. The                            polyp was sessile. The polyp was removed with a                            cold snare. Resection and retrieval were complete.                            Verification of patient identification for the                            specimen was done. Estimated blood loss was minimal.                           Two sessile polyps were found in the transverse                            colon and cecum. The polyps were 2 to 3 mm in size.                            These polyps were removed with a cold biopsy                            forceps. Resection and retrieval were complete.                            Verification of patient identification for the                             specimen was done. Estimated blood loss was minimal.                           Many small and large-mouthed diverticula were found                            in the sigmoid colon.                           The exam was otherwise without abnormality on                            direct and retroflexion views. Complications:            No immediate complications. Estimated Blood Loss:     Estimated blood loss was minimal. Impression:               - One 10 mm  polyp in the transverse colon, removed                            with a cold snare. Resected and retrieved.                           - One 5 mm polyp in the transverse colon, removed                            with a cold snare. Resected and retrieved.                           - Two 2 to 3 mm polyps in the transverse colon and                            in the cecum, removed with a cold biopsy forceps.                            Resected and retrieved.                           - Diverticulosis in the sigmoid colon.                           - The examination was otherwise normal on direct                            and retroflexion views. Recommendation:           - Patient has a contact number available for                            emergencies. The signs and symptoms of potential                            delayed complications were discussed with the                            patient. Return to normal activities tomorrow.                            Written discharge instructions were provided to the                            patient.                           - Resume previous diet.                           - Continue present medications.                           - Repeat colonoscopy is recommended. The  colonoscopy date will be determined after pathology                            results from today's exam become available for                            review. Gatha Mayer,  MD 04/17/2017 8:15:22 AM This report has been signed electronically.

## 2017-04-17 NOTE — Progress Notes (Signed)
Pt's states no medical or surgical changes since previsit or office visit. She was seen by cardiologist for tachycardia,no meds, no procedures.

## 2017-04-18 ENCOUNTER — Telehealth: Payer: Self-pay

## 2017-04-18 NOTE — Telephone Encounter (Signed)
  Follow up Call-  Call back number 04/17/2017 03/16/2015  Post procedure Call Back phone  # [phone]  Permission to leave phone message Yes Yes  Some recent data might be hidden     Patient questions:  Do you have a fever, pain , or abdominal swelling? No. Pain Score  0 *  Have you tolerated food without any problems? Yes.    Have you been able to return to your normal activities? Yes.    Do you have any questions about your discharge instructions: Diet   No. Medications  No. Follow up visit  No.  Do you have questions or concerns about your Care? No.  Actions: * If pain score is 4 or above: No action needed, pain <4.  No problems per pt. maw

## 2017-04-24 ENCOUNTER — Encounter: Payer: Self-pay | Admitting: Internal Medicine

## 2017-04-24 DIAGNOSIS — Z860100 Personal history of colon polyps, unspecified: Secondary | ICD-10-CM

## 2017-04-24 HISTORY — DX: Personal history of colonic polyps: Z86.010

## 2017-04-24 HISTORY — DX: Personal history of colon polyps, unspecified: Z86.0100

## 2017-04-24 NOTE — Progress Notes (Signed)
Sessile serrated adenomas max 10 mm Recall 2021 My Chart letter

## 2017-04-29 ENCOUNTER — Telehealth: Payer: Self-pay | Admitting: Internal Medicine

## 2017-04-29 DIAGNOSIS — H35372 Puckering of macula, left eye: Secondary | ICD-10-CM | POA: Diagnosis not present

## 2017-04-29 DIAGNOSIS — H353112 Nonexudative age-related macular degeneration, right eye, intermediate dry stage: Secondary | ICD-10-CM | POA: Diagnosis not present

## 2017-04-29 DIAGNOSIS — H353124 Nonexudative age-related macular degeneration, left eye, advanced atrophic with subfoveal involvement: Secondary | ICD-10-CM | POA: Diagnosis not present

## 2017-04-29 NOTE — Telephone Encounter (Signed)
Patient notified of the results .  I mailed another copy of the letter.

## 2017-04-29 NOTE — Telephone Encounter (Signed)
Left message for patient to call back  

## 2017-05-04 DIAGNOSIS — M25562 Pain in left knee: Secondary | ICD-10-CM | POA: Diagnosis not present

## 2017-05-04 DIAGNOSIS — M25561 Pain in right knee: Secondary | ICD-10-CM | POA: Diagnosis not present

## 2017-05-16 DIAGNOSIS — H6982 Other specified disorders of Eustachian tube, left ear: Secondary | ICD-10-CM | POA: Diagnosis not present

## 2017-05-16 DIAGNOSIS — E119 Type 2 diabetes mellitus without complications: Secondary | ICD-10-CM | POA: Diagnosis not present

## 2017-05-16 DIAGNOSIS — I10 Essential (primary) hypertension: Secondary | ICD-10-CM | POA: Diagnosis not present

## 2017-05-23 DIAGNOSIS — E782 Mixed hyperlipidemia: Secondary | ICD-10-CM | POA: Diagnosis not present

## 2017-05-23 DIAGNOSIS — Z6829 Body mass index (BMI) 29.0-29.9, adult: Secondary | ICD-10-CM | POA: Diagnosis not present

## 2017-05-23 DIAGNOSIS — E119 Type 2 diabetes mellitus without complications: Secondary | ICD-10-CM | POA: Diagnosis not present

## 2017-05-23 DIAGNOSIS — E8881 Metabolic syndrome: Secondary | ICD-10-CM | POA: Diagnosis not present

## 2017-06-06 DIAGNOSIS — E663 Overweight: Secondary | ICD-10-CM | POA: Diagnosis not present

## 2017-06-06 DIAGNOSIS — Z6829 Body mass index (BMI) 29.0-29.9, adult: Secondary | ICD-10-CM | POA: Diagnosis not present

## 2017-06-18 DIAGNOSIS — Z6830 Body mass index (BMI) 30.0-30.9, adult: Secondary | ICD-10-CM | POA: Diagnosis not present

## 2017-06-18 DIAGNOSIS — E8881 Metabolic syndrome: Secondary | ICD-10-CM | POA: Diagnosis not present

## 2017-06-24 DIAGNOSIS — Z7984 Long term (current) use of oral hypoglycemic drugs: Secondary | ICD-10-CM | POA: Diagnosis not present

## 2017-06-24 DIAGNOSIS — E782 Mixed hyperlipidemia: Secondary | ICD-10-CM | POA: Diagnosis not present

## 2017-06-24 DIAGNOSIS — E785 Hyperlipidemia, unspecified: Secondary | ICD-10-CM | POA: Diagnosis not present

## 2017-06-24 DIAGNOSIS — E113593 Type 2 diabetes mellitus with proliferative diabetic retinopathy without macular edema, bilateral: Secondary | ICD-10-CM | POA: Diagnosis not present

## 2017-06-25 DIAGNOSIS — E785 Hyperlipidemia, unspecified: Secondary | ICD-10-CM | POA: Diagnosis not present

## 2017-06-25 DIAGNOSIS — Z7984 Long term (current) use of oral hypoglycemic drugs: Secondary | ICD-10-CM | POA: Diagnosis not present

## 2017-06-25 DIAGNOSIS — E1169 Type 2 diabetes mellitus with other specified complication: Secondary | ICD-10-CM | POA: Diagnosis not present

## 2017-06-25 DIAGNOSIS — Z780 Asymptomatic menopausal state: Secondary | ICD-10-CM | POA: Diagnosis not present

## 2017-07-01 DIAGNOSIS — H6982 Other specified disorders of Eustachian tube, left ear: Secondary | ICD-10-CM | POA: Diagnosis not present

## 2017-07-07 ENCOUNTER — Telehealth: Payer: Self-pay | Admitting: Gastroenterology

## 2017-07-07 NOTE — Telephone Encounter (Signed)
Your patient called Frances Brown with about 24hr of intermittent crampy LLQ pain and loose stool. She has not had recent Abx or travel. No rectal bleeding.  Your last clinic note and colonoscopy report were reviewed.  I did not think the diarrhea sounded typical for diverticulitis, so I recommended a BRAT diet and as needed imodium but no antibiotics at this time. Please have your nurse call her Monday to get an update on her symptoms.

## 2017-07-08 NOTE — Telephone Encounter (Signed)
Please get an update Thanks

## 2017-07-08 NOTE — Telephone Encounter (Signed)
Patient reports that her symptoms have improved some.  No further diarrhea. She is still having intermittent periumbilical pain and above. She will come in and see Alonza Bogus, PA tomorrow at 10:00

## 2017-07-08 NOTE — Telephone Encounter (Signed)
Left message for patient to call back  

## 2017-07-09 ENCOUNTER — Ambulatory Visit (INDEPENDENT_AMBULATORY_CARE_PROVIDER_SITE_OTHER): Payer: Medicare Other | Admitting: Gastroenterology

## 2017-07-09 ENCOUNTER — Encounter: Payer: Self-pay | Admitting: Gastroenterology

## 2017-07-09 VITALS — BP 112/62 | HR 86 | Ht 64.0 in | Wt 172.0 lb

## 2017-07-09 DIAGNOSIS — R1032 Left lower quadrant pain: Secondary | ICD-10-CM | POA: Insufficient documentation

## 2017-07-09 MED ORDER — CIPROFLOXACIN HCL 500 MG PO TABS: 500.0000 mg | ORAL_TABLET | Freq: Two times a day (BID) | ORAL | 0 refills | Status: DC

## 2017-07-09 MED ORDER — METRONIDAZOLE 500 MG PO TABS: 500.0000 mg | ORAL_TABLET | Freq: Two times a day (BID) | ORAL | 0 refills | Status: DC

## 2017-07-09 NOTE — Progress Notes (Addendum)
07/09/2017 Shatori Bertucci [id number] [birth year]   HISTORY OF PRESENT ILLNESS:  This is a 71 year old female who is known to Dr. Carlean Purl.  She presents to the office today with complaints of left sided abdominal pain.  Has had complaints of chronic/recurrent left sided abdominal pain over the years and has diverticulosis, but never any definite evidence of diverticulitis.  Has not been seen for this complaint in a few years.  She says that this pain began at the end of last week and worsened on Saturday.  Had diarrhea initially as well but that resolved quickly.  She took one day of cipro that a family member had left over, and claims that she's had improvement already.  Says that she has been eating a lot of tomatoes, etc, recently.  No fevers, chills, nausea, or vomiting.   Past Medical History:  Diagnosis Date  . Diabetes mellitus without complication ([place])   . Diverticulosis   . Fibrocystic breast disease   . Fundic gland polyps of stomach, benign 03/24/2015  . GERD (gastroesophageal reflux disease)   . Hx of colonic polyps 04/24/2017  . Hyperlipidemia   . Hypertension   . IBS (irritable bowel syndrome)   . Tachycardia, unspecified 04/08/2017   pt. states she saw Dr. Heloise Ochoa for this no meds no procedures, wore monitor   Past Surgical History:  Procedure Laterality Date  . breast lymph node removed     right  . CHOLECYSTECTOMY    . COLONOSCOPY  06/2002, 04/10/2007   diverticulosis, colon polyps, external hemorrhoids  . KNEE ARTHROSCOPY     left  . PARTIAL HYSTERECTOMY    . SHOULDER SURGERY     right  . UPPER GASTROINTESTINAL ENDOSCOPY  09/27/11  . VIDEO BRONCHOSCOPY Bilateral 02/14/2015   Procedure: VIDEO BRONCHOSCOPY WITHOUT FLUORO;  Surgeon: Collene Gobble, MD;  Location: [facility];  Service: Cardiopulmonary;  Laterality: Bilateral;  . WISDOM TOOTH EXTRACTION      reports that she has never smoked. She has never used smokeless tobacco. She reports that she does  not drink alcohol or use drugs. family history includes Cirrhosis in her unknown relative; Colon cancer (age of onset: 29) in her maternal uncle; Diabetes in her father and mother; Heart failure in her father and mother; Hypertension in her mother and sister; Leukemia in her unknown relative; Lung cancer in her mother; Stroke in her mother. Allergies  Allergen Reactions  . Janumet Xr [Sitagliptin-Metformin Hcl Er] Swelling  . Zocor [Simvastatin] Swelling  . Sulfa Antibiotics   . Trimethoprim     Rash / hives that are painful  . Sulfur Rash      Outpatient Encounter Prescriptions as of 07/09/2017  Medication Sig  . bisoprolol (ZEBETA) 10 MG tablet Take 10 mg by mouth daily.  Marland Kitchen esomeprazole (NEXIUM) 10 MG packet Take 10 mg by mouth daily.  Marland Kitchen loratadine (CLARITIN) 10 MG tablet Take 10 mg by mouth daily as needed for allergies.   Marland Kitchen losartan (COZAAR) 25 MG tablet Take 25 mg by mouth daily.  . Magnesium 250 MG TABS Take 250 mg by mouth daily.  . metFORMIN (GLUCOPHAGE) 500 MG tablet Take 500 mg by mouth 2 (two) times daily.   . Multiple Vitamins-Minerals (PRESERVISION AREDS 2 PO) Take 2 tablets by mouth daily.  . vitamin B-12 (CYANOCOBALAMIN) 1000 MCG tablet Take 1,000 mcg by mouth daily.  . ciprofloxacin (CIPRO) 500 MG tablet Take 1 tablet (500 mg total) by mouth 2 (two) times daily.  Marland Kitchen  metroNIDAZOLE (FLAGYL) 500 MG tablet Take 1 tablet (500 mg total) by mouth 2 (two) times daily.  . [DISCONTINUED] Cholecalciferol (VITAMIN D3) 2000 UNITS TABS Take 1 capsule by mouth every other day.   . [DISCONTINUED] Coenzyme Q10 50 MG CAPS Take 1 capsule by mouth every other day.   . [DISCONTINUED] pravastatin (PRAVACHOL) 80 MG tablet Take 80 mg by mouth daily.    Facility-Administered Encounter Medications as of 07/09/2017  Medication  . 0.9 %  sodium chloride infusion     REVIEW OF SYSTEMS  : All other systems reviewed and negative except where noted in the History of Present Illness.   PHYSICAL  EXAM: BP 112/62 (BP Location: Right Arm, Patient Position: Sitting, Cuff Size: Normal)   Pulse 86   Ht 5\' 4"  (1.626 m)   Wt 172 lb (78 kg)   BMI 29.52 kg/m  General: Well developed white female in no acute distress Head: Normocephalic and atraumatic Eyes:  Sclerae anicteric, conjunctiva pink. Ears: Normal auditory acuity Lungs: Clear throughout to auscultation; no increased WOB. Heart: Regular rate and rhythm; no M/R/G. Abdomen: Soft, non-distended.  BS present.  Mild left sided TTP. Musculoskeletal: Symmetrical with no gross deformities  Skin: No lesions on visible extremities Extremities: No edema  Neurological: Alert oriented x 4, grossly non-focal Psychological:  Alert and cooperative. Normal mood and affect  ASSESSMENT AND PLAN: *71 year old female with complaints of left sided abdominal pain.  Has had complaints of chronic/recurrent left sided abdominal pain over the years and has diverticulosis, but never any definite evidence of diverticulitis.  Has not been seen for this complaint in a few years.  ? If she has some mild diverticulitis currently.  She took one day of cipro that a family member had left over, and claims that she's had improvement already. We will treat with Cipro 500 mg twice a day and Flagyl 500mg  twice a day for 7 days. She'll call back next week with an update on her symptoms and if still present despite treatment then will need to consider CT scan.  Of course she will call back sooner or go to the ED if symptoms worsen.     CC:  Leeroy Cha  Agree with Ms. Oliveah Zwack's management.  Gatha Mayer, MD, [facility]

## 2017-07-09 NOTE — Patient Instructions (Signed)
We have sent the following medications to your pharmacy for you to pick up at your convenience: Cipro 500 mg twice a day Flagyl 500 mg twice a day  Call back mid-week next week with updates or sooner if worsening. Ask for Katina Degree, RN.

## 2017-07-25 ENCOUNTER — Telehealth: Payer: Self-pay | Admitting: Gastroenterology

## 2017-07-25 NOTE — Telephone Encounter (Signed)
FYI:  The pt states that her symptoms have resolved and also said that she stopped the flagyl after 3 days.  She was advised to call our office before stopping any medications especially antibiotics.  Pt agreed and states she will contact our office with any further concerns.

## 2017-07-25 NOTE — Telephone Encounter (Signed)
Noted.  Thank you.  Please save this to her chart.

## 2017-07-26 DIAGNOSIS — Z1231 Encounter for screening mammogram for malignant neoplasm of breast: Secondary | ICD-10-CM | POA: Diagnosis not present

## 2017-07-31 DIAGNOSIS — H353124 Nonexudative age-related macular degeneration, left eye, advanced atrophic with subfoveal involvement: Secondary | ICD-10-CM | POA: Diagnosis not present

## 2017-07-31 DIAGNOSIS — H353112 Nonexudative age-related macular degeneration, right eye, intermediate dry stage: Secondary | ICD-10-CM | POA: Diagnosis not present

## 2017-07-31 DIAGNOSIS — H35372 Puckering of macula, left eye: Secondary | ICD-10-CM | POA: Diagnosis not present

## 2017-07-31 DIAGNOSIS — E119 Type 2 diabetes mellitus without complications: Secondary | ICD-10-CM | POA: Diagnosis not present

## 2017-08-01 DIAGNOSIS — Z780 Asymptomatic menopausal state: Secondary | ICD-10-CM | POA: Diagnosis not present

## 2017-08-09 DIAGNOSIS — Z6829 Body mass index (BMI) 29.0-29.9, adult: Secondary | ICD-10-CM | POA: Diagnosis not present

## 2017-08-09 DIAGNOSIS — E8881 Metabolic syndrome: Secondary | ICD-10-CM | POA: Diagnosis not present

## 2017-08-19 DIAGNOSIS — E785 Hyperlipidemia, unspecified: Secondary | ICD-10-CM | POA: Diagnosis not present

## 2017-08-20 DIAGNOSIS — R197 Diarrhea, unspecified: Secondary | ICD-10-CM | POA: Diagnosis not present

## 2017-08-20 DIAGNOSIS — Z23 Encounter for immunization: Secondary | ICD-10-CM | POA: Diagnosis not present

## 2017-08-20 DIAGNOSIS — E113593 Type 2 diabetes mellitus with proliferative diabetic retinopathy without macular edema, bilateral: Secondary | ICD-10-CM | POA: Diagnosis not present

## 2017-08-20 DIAGNOSIS — E785 Hyperlipidemia, unspecified: Secondary | ICD-10-CM | POA: Diagnosis not present

## 2017-08-26 DIAGNOSIS — N393 Stress incontinence (female) (male): Secondary | ICD-10-CM | POA: Diagnosis not present

## 2017-08-26 DIAGNOSIS — Z87440 Personal history of urinary (tract) infections: Secondary | ICD-10-CM | POA: Diagnosis not present

## 2017-08-26 DIAGNOSIS — N952 Postmenopausal atrophic vaginitis: Secondary | ICD-10-CM | POA: Diagnosis not present

## 2017-09-19 DIAGNOSIS — Z6829 Body mass index (BMI) 29.0-29.9, adult: Secondary | ICD-10-CM | POA: Diagnosis not present

## 2017-09-19 DIAGNOSIS — E782 Mixed hyperlipidemia: Secondary | ICD-10-CM | POA: Diagnosis not present

## 2017-09-19 DIAGNOSIS — Z124 Encounter for screening for malignant neoplasm of cervix: Secondary | ICD-10-CM | POA: Diagnosis not present

## 2017-09-19 DIAGNOSIS — E119 Type 2 diabetes mellitus without complications: Secondary | ICD-10-CM | POA: Diagnosis not present

## 2017-10-24 DIAGNOSIS — E119 Type 2 diabetes mellitus without complications: Secondary | ICD-10-CM | POA: Diagnosis not present

## 2017-10-24 DIAGNOSIS — E782 Mixed hyperlipidemia: Secondary | ICD-10-CM | POA: Diagnosis not present

## 2017-10-24 DIAGNOSIS — Z6829 Body mass index (BMI) 29.0-29.9, adult: Secondary | ICD-10-CM | POA: Diagnosis not present

## 2017-11-06 DIAGNOSIS — I10 Essential (primary) hypertension: Secondary | ICD-10-CM | POA: Diagnosis not present

## 2017-11-06 DIAGNOSIS — Z6829 Body mass index (BMI) 29.0-29.9, adult: Secondary | ICD-10-CM | POA: Diagnosis not present

## 2017-11-06 DIAGNOSIS — E119 Type 2 diabetes mellitus without complications: Secondary | ICD-10-CM | POA: Diagnosis not present

## 2017-11-06 DIAGNOSIS — E782 Mixed hyperlipidemia: Secondary | ICD-10-CM | POA: Diagnosis not present

## 2017-12-11 DIAGNOSIS — I10 Essential (primary) hypertension: Secondary | ICD-10-CM | POA: Diagnosis not present

## 2017-12-11 DIAGNOSIS — Z6830 Body mass index (BMI) 30.0-30.9, adult: Secondary | ICD-10-CM | POA: Diagnosis not present

## 2017-12-11 DIAGNOSIS — E119 Type 2 diabetes mellitus without complications: Secondary | ICD-10-CM | POA: Diagnosis not present

## 2017-12-24 DIAGNOSIS — E113591 Type 2 diabetes mellitus with proliferative diabetic retinopathy without macular edema, right eye: Secondary | ICD-10-CM | POA: Diagnosis not present

## 2017-12-24 DIAGNOSIS — H353124 Nonexudative age-related macular degeneration, left eye, advanced atrophic with subfoveal involvement: Secondary | ICD-10-CM | POA: Diagnosis not present

## 2017-12-24 DIAGNOSIS — H4311 Vitreous hemorrhage, right eye: Secondary | ICD-10-CM | POA: Diagnosis not present

## 2017-12-24 DIAGNOSIS — H353112 Nonexudative age-related macular degeneration, right eye, intermediate dry stage: Secondary | ICD-10-CM | POA: Diagnosis not present

## 2017-12-31 DIAGNOSIS — I10 Essential (primary) hypertension: Secondary | ICD-10-CM | POA: Diagnosis not present

## 2017-12-31 DIAGNOSIS — E7849 Other hyperlipidemia: Secondary | ICD-10-CM | POA: Diagnosis not present

## 2017-12-31 DIAGNOSIS — Z1389 Encounter for screening for other disorder: Secondary | ICD-10-CM | POA: Diagnosis not present

## 2017-12-31 DIAGNOSIS — H4311 Vitreous hemorrhage, right eye: Secondary | ICD-10-CM | POA: Diagnosis not present

## 2017-12-31 DIAGNOSIS — E119 Type 2 diabetes mellitus without complications: Secondary | ICD-10-CM | POA: Diagnosis not present

## 2017-12-31 DIAGNOSIS — H3530 Unspecified macular degeneration: Secondary | ICD-10-CM | POA: Diagnosis not present

## 2017-12-31 DIAGNOSIS — Z6830 Body mass index (BMI) 30.0-30.9, adult: Secondary | ICD-10-CM | POA: Diagnosis not present

## 2018-01-02 DIAGNOSIS — H4311 Vitreous hemorrhage, right eye: Secondary | ICD-10-CM | POA: Diagnosis not present

## 2018-01-02 DIAGNOSIS — E113591 Type 2 diabetes mellitus with proliferative diabetic retinopathy without macular edema, right eye: Secondary | ICD-10-CM | POA: Diagnosis not present

## 2018-01-03 DIAGNOSIS — E113591 Type 2 diabetes mellitus with proliferative diabetic retinopathy without macular edema, right eye: Secondary | ICD-10-CM | POA: Diagnosis not present

## 2018-01-10 DIAGNOSIS — H353124 Nonexudative age-related macular degeneration, left eye, advanced atrophic with subfoveal involvement: Secondary | ICD-10-CM | POA: Diagnosis not present

## 2018-01-10 DIAGNOSIS — E113591 Type 2 diabetes mellitus with proliferative diabetic retinopathy without macular edema, right eye: Secondary | ICD-10-CM | POA: Diagnosis not present

## 2018-01-31 DIAGNOSIS — E113591 Type 2 diabetes mellitus with proliferative diabetic retinopathy without macular edema, right eye: Secondary | ICD-10-CM | POA: Diagnosis not present

## 2018-01-31 DIAGNOSIS — H353112 Nonexudative age-related macular degeneration, right eye, intermediate dry stage: Secondary | ICD-10-CM | POA: Diagnosis not present

## 2018-01-31 DIAGNOSIS — H353124 Nonexudative age-related macular degeneration, left eye, advanced atrophic with subfoveal involvement: Secondary | ICD-10-CM | POA: Diagnosis not present

## 2018-02-13 DIAGNOSIS — I860 Sublingual varices: Secondary | ICD-10-CM | POA: Diagnosis not present

## 2018-02-21 DIAGNOSIS — L738 Other specified follicular disorders: Secondary | ICD-10-CM | POA: Diagnosis not present

## 2018-02-21 DIAGNOSIS — L820 Inflamed seborrheic keratosis: Secondary | ICD-10-CM | POA: Diagnosis not present

## 2018-02-21 DIAGNOSIS — D1801 Hemangioma of skin and subcutaneous tissue: Secondary | ICD-10-CM | POA: Diagnosis not present

## 2018-02-21 DIAGNOSIS — L821 Other seborrheic keratosis: Secondary | ICD-10-CM | POA: Diagnosis not present

## 2018-02-21 DIAGNOSIS — D225 Melanocytic nevi of trunk: Secondary | ICD-10-CM | POA: Diagnosis not present

## 2018-03-12 DIAGNOSIS — H353124 Nonexudative age-related macular degeneration, left eye, advanced atrophic with subfoveal involvement: Secondary | ICD-10-CM | POA: Diagnosis not present

## 2018-03-12 DIAGNOSIS — E113591 Type 2 diabetes mellitus with proliferative diabetic retinopathy without macular edema, right eye: Secondary | ICD-10-CM | POA: Diagnosis not present

## 2018-03-12 DIAGNOSIS — H353112 Nonexudative age-related macular degeneration, right eye, intermediate dry stage: Secondary | ICD-10-CM | POA: Diagnosis not present

## 2018-03-12 DIAGNOSIS — H35373 Puckering of macula, bilateral: Secondary | ICD-10-CM | POA: Diagnosis not present

## 2018-03-26 DIAGNOSIS — Z6831 Body mass index (BMI) 31.0-31.9, adult: Secondary | ICD-10-CM | POA: Diagnosis not present

## 2018-03-26 DIAGNOSIS — I10 Essential (primary) hypertension: Secondary | ICD-10-CM | POA: Diagnosis not present

## 2018-03-26 DIAGNOSIS — E119 Type 2 diabetes mellitus without complications: Secondary | ICD-10-CM | POA: Diagnosis not present

## 2018-04-21 DIAGNOSIS — H6983 Other specified disorders of Eustachian tube, bilateral: Secondary | ICD-10-CM | POA: Diagnosis not present

## 2018-04-21 DIAGNOSIS — J3089 Other allergic rhinitis: Secondary | ICD-10-CM | POA: Diagnosis not present

## 2018-04-21 DIAGNOSIS — R42 Dizziness and giddiness: Secondary | ICD-10-CM | POA: Diagnosis not present

## 2018-04-21 DIAGNOSIS — H9313 Tinnitus, bilateral: Secondary | ICD-10-CM | POA: Diagnosis not present

## 2018-04-23 ENCOUNTER — Ambulatory Visit: Payer: Medicare Other | Admitting: Cardiovascular Disease

## 2018-05-06 DIAGNOSIS — H903 Sensorineural hearing loss, bilateral: Secondary | ICD-10-CM | POA: Diagnosis not present

## 2018-05-09 DIAGNOSIS — Z6829 Body mass index (BMI) 29.0-29.9, adult: Secondary | ICD-10-CM | POA: Diagnosis not present

## 2018-05-09 DIAGNOSIS — R05 Cough: Secondary | ICD-10-CM | POA: Diagnosis not present

## 2018-05-09 DIAGNOSIS — J069 Acute upper respiratory infection, unspecified: Secondary | ICD-10-CM | POA: Diagnosis not present

## 2018-05-18 NOTE — Progress Notes (Signed)
Chief Complaint  Patient presents with  . Follow-up    PACs     History of Present Illness: 72 yo female with history of DM, HTN, HLD and palpitations felt to be due to PACs who is here today for follow up. I saw her in 2012 for evaluation of palpitations and leg pain in the setting of steroid use for poison ivy treatment. I arranged a 48 Holter monitor and an echo. Her echo showed normal LV size and function. Her Holter monitor showed occasional premature atrial contractions but no VT or atrial fibrillation. I saw her in 2018 and she had c/o more frequent palpitations. Cardiac monitor April 2018 with PACs and several short runs of SVT. Echo April 2018 with normal LV systolic function and mild MR.   She is here today for follow up. The patient denies any chest pain, dyspnea, palpitations, lower extremity edema, orthopnea, PND, dizziness, near syncope or syncope. She had had a cough for several weeks.   Primary Care Physician: Marton Redwood, MD  Past Medical History:  Diagnosis Date  . Diabetes mellitus without complication ([place])   . Diverticulosis   . Fibrocystic breast disease   . Fundic gland polyps of stomach, benign 03/24/2015  . GERD (gastroesophageal reflux disease)   . Hx of colonic polyps 04/24/2017  . Hyperlipidemia   . Hypertension   . IBS (irritable bowel syndrome)   . Tachycardia, unspecified 04/08/2017   pt. states she saw Dr. Heloise Ochoa for this no meds no procedures, wore monitor    Past Surgical History:  Procedure Laterality Date  . breast lymph node removed     right  . CHOLECYSTECTOMY    . COLONOSCOPY  06/2002, 04/10/2007   diverticulosis, colon polyps, external hemorrhoids  . KNEE ARTHROSCOPY     left  . PARTIAL HYSTERECTOMY    . SHOULDER SURGERY     right  . UPPER GASTROINTESTINAL ENDOSCOPY  09/27/11  . VIDEO BRONCHOSCOPY Bilateral 02/14/2015   Procedure: VIDEO BRONCHOSCOPY WITHOUT FLUORO;  Surgeon: Collene Gobble, MD;  Location: [facility];  Service:  Cardiopulmonary;  Laterality: Bilateral;  . WISDOM TOOTH EXTRACTION      Current Outpatient Medications  Medication Sig Dispense Refill  . bisoprolol (ZEBETA) 10 MG tablet Take 10 mg by mouth daily.    Marland Kitchen loratadine (CLARITIN) 10 MG tablet Take 10 mg by mouth daily as needed for allergies.     Marland Kitchen losartan (COZAAR) 25 MG tablet Take 25 mg by mouth daily.    . Magnesium 250 MG TABS Take 250 mg by mouth daily.    . metFORMIN (GLUCOPHAGE) 500 MG tablet Take 500 mg by mouth 2 (two) times daily.     . Multiple Vitamins-Minerals (PRESERVISION AREDS 2 PO) Take 2 tablets by mouth daily.    . pravastatin (PRAVACHOL) 80 MG tablet Take 1 tablet by mouth daily.    . vitamin B-12 (CYANOCOBALAMIN) 1000 MCG tablet Take 1,000 mcg by mouth daily.     Current Facility-Administered Medications  Medication Dose Route Frequency Provider Last Rate Last Dose  . 0.9 %  sodium chloride infusion  500 mL Intravenous Continuous Gatha Mayer, MD        Allergies  Allergen Reactions  . Janumet Xr [Sitagliptin-Metformin Hcl Er] Swelling  . Zocor [Simvastatin] Swelling  . Sulfa Antibiotics   . Trimethoprim     Rash / hives that are painful  . Sulfur Rash    Social History   Socioeconomic History  . Marital status:  Married    Spouse name: Not on file  . Number of children: Not on file  . Years of education: Not on file  . Highest education level: Not on file  Occupational History  . Not on file  Social Needs  . Financial resource strain: Not on file  . Food insecurity:    Worry: Not on file    Inability: Not on file  . Transportation needs:    Medical: Not on file    Non-medical: Not on file  Tobacco Use  . Smoking status: Never Smoker  . Smokeless tobacco: Never Used  Substance and Sexual Activity  . Alcohol use: No    Alcohol/week: 0.0 oz  . Drug use: No  . Sexual activity: Yes    Birth control/protection: Post-menopausal  Lifestyle  . Physical activity:    Days per week: Not on file     Minutes per session: Not on file  . Stress: Not on file  Relationships  . Social connections:    Talks on phone: Not on file    Gets together: Not on file    Attends religious service: Not on file    Active member of club or organization: Not on file    Attends meetings of clubs or organizations: Not on file    Relationship status: Not on file  . Intimate partner violence:    Fear of current or ex partner: Not on file    Emotionally abused: Not on file    Physically abused: Not on file    Forced sexual activity: Not on file  Other Topics Concern  . Not on file  Social History Narrative  . Not on file    Family History  Problem Relation Age of Onset  . Diabetes Father   . Heart failure Father   . Hypertension Mother   . Heart failure Mother   . Diabetes Mother   . Lung cancer Mother   . Stroke Mother   . Leukemia Unknown        grandfather  . Cirrhosis Unknown        grandmother  . Hypertension Sister   . Colon cancer Maternal Uncle 80    Review of Systems:  As stated in the HPI and otherwise negative.   BP 100/60   Pulse 64   Ht 5\' 4"  (1.626 m)   Wt 167 lb 3.2 oz (75.8 kg)   SpO2 94%   BMI 28.70 kg/m   Physical Examination:  General: Well developed, well nourished, NAD  HEENT: OP clear, mucus membranes moist  SKIN: warm, dry. No rashes. Neuro: No focal deficits  Musculoskeletal: Muscle strength 5/5 all ext  Psychiatric: Mood and affect normal  Neck: No JVD, no carotid bruits, no thyromegaly, no lymphadenopathy.  Lungs:Clear bilaterally, no wheezes, rhonci, crackles Cardiovascular: Regular rate and rhythm. No murmurs, gallops or rubs. Abdomen:Soft. Bowel sounds present. Non-tender.  Extremities: No lower extremity edema. Pulses are 2 + in the bilateral DP/PT.  Echo April 2018: Left ventricle: The cavity size was normal. Systolic function was   vigorous. The estimated ejection fraction was in the range of 65%   to 70%. Wall motion was normal; there were  no regional wall   motion abnormalities. Doppler parameters are consistent with   abnormal left ventricular relaxation (grade 1 diastolic   dysfunction). Doppler parameters are consistent with elevated   ventricular end-diastolic filling pressure. - Aortic valve: There was no regurgitation. - Mitral valve: There was mild regurgitation. -  Left atrium: The atrium was normal in size. - Right ventricle: Systolic function was normal. - Right atrium: The atrium was normal in size. - Tricuspid valve: There was trivial regurgitation. - Pericardium, extracardiac: The pericardium was normal in   appearance.  EKG:  EKG is ordered today. The ekg ordered today demonstrates NSR, rate 64 bpm  Recent Labs: No results found for requested labs within last 8760 hours.    Wt Readings from Last 3 Encounters:  05/19/18 167 lb 3.2 oz (75.8 kg)  07/09/17 172 lb (78 kg)  04/17/17 172 lb (78 kg)    Other studies Reviewed: Additional studies/ records that were reviewed today include: . Review of the above records demonstrates:   Assessment and Plan:   1. PALPITATIONS/PAC/Paroxysmal SVT: No change in palpitations. Echo April 2018 with normal LV size and function. Continue beta blocker. Avoid stimulants.     2. HTN: BP is controlled. No changes.   Current medicines are reviewed at length with the patient today.  The patient does not have concerns regarding medicines.  The following changes have been made:  no change  Labs/ tests ordered today include:   Orders Placed This Encounter  Procedures  . EKG 12-Lead     Disposition:   FU with me in 12  months   Signed, Lauree Chandler, MD 05/19/2018 11:05 AM    [facility] [address], [place], [place]  [address] Phone: [phone]; Fax: [phone]

## 2018-05-19 ENCOUNTER — Encounter: Payer: Self-pay | Admitting: Cardiovascular Disease

## 2018-05-19 ENCOUNTER — Ambulatory Visit (INDEPENDENT_AMBULATORY_CARE_PROVIDER_SITE_OTHER): Payer: Medicare Other | Admitting: Cardiovascular Disease

## 2018-05-19 VITALS — BP 100/60 | HR 64 | Ht 64.0 in | Wt 167.2 lb

## 2018-05-19 DIAGNOSIS — R002 Palpitations: Secondary | ICD-10-CM

## 2018-05-19 DIAGNOSIS — I10 Essential (primary) hypertension: Secondary | ICD-10-CM

## 2018-05-19 DIAGNOSIS — I491 Atrial premature depolarization: Secondary | ICD-10-CM | POA: Diagnosis not present

## 2018-05-19 NOTE — Patient Instructions (Signed)
Your physician recommends that you continue on your current medications as directed. Please refer to the Current Medication list given to you today. Your physician wants you to follow-up in: [place]. You will receive a reminder letter in the mail two months in advance. If you don't receive a letter, please call our office to schedule the follow-up appointment.

## 2018-06-03 DIAGNOSIS — E119 Type 2 diabetes mellitus without complications: Secondary | ICD-10-CM | POA: Diagnosis not present

## 2018-06-03 DIAGNOSIS — I10 Essential (primary) hypertension: Secondary | ICD-10-CM | POA: Diagnosis not present

## 2018-06-03 DIAGNOSIS — R82998 Other abnormal findings in urine: Secondary | ICD-10-CM | POA: Diagnosis not present

## 2018-06-03 DIAGNOSIS — E7849 Other hyperlipidemia: Secondary | ICD-10-CM | POA: Diagnosis not present

## 2018-06-10 DIAGNOSIS — E781 Pure hyperglyceridemia: Secondary | ICD-10-CM | POA: Diagnosis not present

## 2018-06-10 DIAGNOSIS — K219 Gastro-esophageal reflux disease without esophagitis: Secondary | ICD-10-CM | POA: Diagnosis not present

## 2018-06-10 DIAGNOSIS — H4311 Vitreous hemorrhage, right eye: Secondary | ICD-10-CM | POA: Diagnosis not present

## 2018-06-10 DIAGNOSIS — H9319 Tinnitus, unspecified ear: Secondary | ICD-10-CM | POA: Diagnosis not present

## 2018-06-10 DIAGNOSIS — I10 Essential (primary) hypertension: Secondary | ICD-10-CM | POA: Diagnosis not present

## 2018-06-10 DIAGNOSIS — E7849 Other hyperlipidemia: Secondary | ICD-10-CM | POA: Diagnosis not present

## 2018-06-10 DIAGNOSIS — E1139 Type 2 diabetes mellitus with other diabetic ophthalmic complication: Secondary | ICD-10-CM | POA: Diagnosis not present

## 2018-06-10 DIAGNOSIS — Z1389 Encounter for screening for other disorder: Secondary | ICD-10-CM | POA: Diagnosis not present

## 2018-06-10 DIAGNOSIS — Z6829 Body mass index (BMI) 29.0-29.9, adult: Secondary | ICD-10-CM | POA: Diagnosis not present

## 2018-06-10 DIAGNOSIS — Z0000 Encounter for general adult medical examination without abnormal findings: Secondary | ICD-10-CM | POA: Diagnosis not present

## 2018-06-10 DIAGNOSIS — Z86010 Personal history of colonic polyps: Secondary | ICD-10-CM | POA: Diagnosis not present

## 2018-06-11 ENCOUNTER — Telehealth: Payer: Self-pay | Admitting: Emergency Medicine

## 2018-06-11 NOTE — Telephone Encounter (Signed)
Called and spoke to pt.  Pt is calling for PNA vaccine date.  There is no documentation that pt had PNA vaccine within our system.  Pt stated per her notes, she had vaccine at our office in 2016. I have reviewed 2016 OV note- PNA vaccine was not mentioned within notes.  Nothing further is needed.

## 2018-06-11 NOTE — Telephone Encounter (Signed)
ATC pt, no answer. Left message for pt to call back.  I do not see any record of pneumonia vaccine in her chart. Will await return call to clarify.

## 2018-06-11 NOTE — Telephone Encounter (Signed)
Pt is calling back [phone]

## 2018-06-16 DIAGNOSIS — Z1212 Encounter for screening for malignant neoplasm of rectum: Secondary | ICD-10-CM | POA: Diagnosis not present

## 2018-07-14 ENCOUNTER — Ambulatory Visit (INDEPENDENT_AMBULATORY_CARE_PROVIDER_SITE_OTHER): Payer: Medicare Other | Admitting: Gastroenterology

## 2018-07-14 ENCOUNTER — Encounter: Payer: Self-pay | Admitting: Gastroenterology

## 2018-07-14 ENCOUNTER — Telehealth: Payer: Self-pay | Admitting: Gastroenterology

## 2018-07-14 ENCOUNTER — Encounter

## 2018-07-14 VITALS — BP 118/74 | HR 61 | Ht 64.0 in | Wt 165.2 lb

## 2018-07-14 DIAGNOSIS — K219 Gastro-esophageal reflux disease without esophagitis: Secondary | ICD-10-CM | POA: Diagnosis not present

## 2018-07-14 DIAGNOSIS — R490 Dysphonia: Secondary | ICD-10-CM

## 2018-07-14 MED ORDER — ESOMEPRAZOLE MAGNESIUM 20 MG PO CPDR: 20.0000 mg | DELAYED_RELEASE_CAPSULE | Freq: Two times a day (BID) | ORAL | 1 refills | Status: DC

## 2018-07-14 NOTE — Progress Notes (Addendum)
07/14/2018 Frances Brown [id number] [birth year]   HISTORY OF PRESENT ILLNESS: This is a 72 year old female who is known to Dr. Carlean Purl for following of her reflux symptoms and hoarseness, which are long-standing complaints.  Her last EGD was in April 2016 at which time she was found to have a 3 cm hiatal hernia and fundic gland polyps.  She is here today with the same complaints.  She tells me that she has been having ringing or a popping/crackling sound in her ears.  She says that this all started in February when her blood pressure medications were changed and she was placed on amlodipine.  She thought that was the cause, but it was discontinued after 30 days and the sound continued.  She is here asking if he Nexium could be the source.  She has been on the Nexium for years.  She discontinued the Nexium for 30 days to see if it would help and the sound remained. While she was off of the Nexium she took zantac and her reflux symptoms and hoarseness seemed to worsen.  Also complaining of epigastric discomfort.  Has been back on the Nexium 20 mg daily for several weeks now.   Past Medical History:  Diagnosis Date  . Diabetes mellitus without complication ([place])   . Diverticulosis   . Fibrocystic breast disease   . Fundic gland polyps of stomach, benign 03/24/2015  . GERD (gastroesophageal reflux disease)   . Hx of colonic polyps 04/24/2017  . Hyperlipidemia   . Hypertension   . IBS (irritable bowel syndrome)   . Tachycardia, unspecified 04/08/2017   pt. states she saw Dr. Heloise Ochoa for this no meds no procedures, wore monitor   Past Surgical History:  Procedure Laterality Date  . breast lymph node removed     right  . CHOLECYSTECTOMY    . COLONOSCOPY  06/2002, 04/10/2007   diverticulosis, colon polyps, external hemorrhoids  . KNEE ARTHROSCOPY     left  . PARTIAL HYSTERECTOMY    . SHOULDER SURGERY     right  . UPPER GASTROINTESTINAL ENDOSCOPY  09/27/11  . VIDEO BRONCHOSCOPY  Bilateral 02/14/2015   Procedure: VIDEO BRONCHOSCOPY WITHOUT FLUORO;  Surgeon: Collene Gobble, MD;  Location: [facility];  Service: Cardiopulmonary;  Laterality: Bilateral;  . WISDOM TOOTH EXTRACTION      reports that she has never smoked. She has never used smokeless tobacco. She reports that she does not drink alcohol or use drugs. family history includes Cirrhosis in her unknown relative; Colon cancer (age of onset: 26) in her maternal uncle; Diabetes in her father and mother; Heart failure in her father and mother; Hypertension in her mother and sister; Leukemia in her unknown relative; Lung cancer in her mother; Stroke in her mother. Allergies  Allergen Reactions  . Janumet Xr [Sitagliptin-Metformin Hcl Er] Swelling  . Zocor [Simvastatin] Swelling  . Sulfa Antibiotics   . Trimethoprim     Rash / hives that are painful  . Sulfur Rash      Outpatient Encounter Medications as of 07/14/2018  Medication Sig  . bisoprolol (ZEBETA) 10 MG tablet Take 10 mg by mouth daily.  Marland Kitchen esomeprazole (NEXIUM) 20 MG capsule Take 20 mg by mouth daily at 12 noon.  . loratadine (CLARITIN) 10 MG tablet Take 10 mg by mouth daily as needed for allergies.   Marland Kitchen losartan (COZAAR) 25 MG tablet Take 25 mg by mouth daily.  . Magnesium 250 MG TABS Take 250 mg by mouth daily.  Marland Kitchen  metFORMIN (GLUCOPHAGE) 500 MG tablet Take 500 mg by mouth 2 (two) times daily.   . Multiple Vitamins-Minerals (PRESERVISION AREDS 2 PO) Take 2 tablets by mouth daily.  . pravastatin (PRAVACHOL) 80 MG tablet Take 1 tablet by mouth daily.  . vitamin B-12 (CYANOCOBALAMIN) 1000 MCG tablet Take 1,000 mcg by mouth daily.   Facility-Administered Encounter Medications as of 07/14/2018  Medication  . 0.9 %  sodium chloride infusion     REVIEW OF SYSTEMS  : All other systems reviewed and negative except where noted in the History of Present Illness.   PHYSICAL EXAM: BP 118/74   Pulse 61   Ht 5\' 4"  (1.626 m)   Wt 165 lb 3.2 oz (74.9 kg)   BMI  28.36 kg/m  General: Well developed white female in no acute distress Head: Normocephalic and atraumatic Eyes:  Sclerae anicteric, conjunctiva pink. Ears: Normal auditory acuity Lungs: Clear throughout to auscultation; no increased WOB. Heart: Regular rate and rhythm; no M/R/G. Abdomen: Soft, non-distended.  BS present.  Non-tender. Musculoskeletal: Symmetrical with no gross deformities  Skin: No lesions on visible extremities Extremities: No edema  Neurological: Alert oriented x 4, grossly non-focal Psychological:  Alert and cooperative. Normal mood and affect  ASSESSMENT AND PLAN: *GERD and hoarseness:  She is currently on nexium 20 mg daily.  She had discontinued it for a while because she thought it may be the cause of her tinnitus.  Even while off the medication the tinnitus continued.  I do not think her Nexium is causing it as she has been on it for a long time.  She continues to have reflux and hoarseness, which she may have just had a flare of her reflux while off of the medication.  Will increase to 20 mg twice daily for now.  She will call back in 3 to 4 weeks with an update.   CC:  Marton Redwood, MD  Agree with Ms. Alphia Kava management.  Gatha Mayer, MD, [facility]

## 2018-07-14 NOTE — Telephone Encounter (Signed)
Medication list updated.

## 2018-07-14 NOTE — Patient Instructions (Signed)
We have sent the following medications to your pharmacy for you to pick up at your convenience: Nexium 20 mg twice a day

## 2018-07-14 NOTE — Telephone Encounter (Signed)
The pt states that her nexium is only covered for once daily thru her insurance.  She will have the pharmacy send a quantity exception.  She will get OTC in the mean time.

## 2018-07-15 ENCOUNTER — Telehealth: Payer: Self-pay | Admitting: Gastroenterology

## 2018-07-15 MED ORDER — ESOMEPRAZOLE MAGNESIUM 20 MG PO CPDR: 20.0000 mg | DELAYED_RELEASE_CAPSULE | Freq: Two times a day (BID) | ORAL | 1 refills | Status: DC

## 2018-07-15 NOTE — Telephone Encounter (Signed)
Rx sent to pharmacy   

## 2018-07-16 ENCOUNTER — Telehealth: Payer: Self-pay | Admitting: Gastroenterology

## 2018-07-16 MED ORDER — ESOMEPRAZOLE MAGNESIUM 20 MG PO CPDR: 20.0000 mg | DELAYED_RELEASE_CAPSULE | Freq: Two times a day (BID) | ORAL | 1 refills | Status: DC

## 2018-07-16 NOTE — Addendum Note (Signed)
Addended by: Wyline Beady on: 07/16/2018 10:23 AM   Modules accepted: Orders

## 2018-07-16 NOTE — Telephone Encounter (Signed)
rx sent to pharmacy

## 2018-07-22 DIAGNOSIS — Z23 Encounter for immunization: Secondary | ICD-10-CM | POA: Diagnosis not present

## 2018-07-28 DIAGNOSIS — H4710 Unspecified papilledema: Secondary | ICD-10-CM

## 2018-07-28 HISTORY — DX: Unspecified papilledema: H47.10

## 2018-07-30 ENCOUNTER — Other Ambulatory Visit: Payer: Self-pay | Admitting: Internal Medicine

## 2018-07-30 DIAGNOSIS — H353124 Nonexudative age-related macular degeneration, left eye, advanced atrophic with subfoveal involvement: Secondary | ICD-10-CM | POA: Diagnosis not present

## 2018-07-30 DIAGNOSIS — H353112 Nonexudative age-related macular degeneration, right eye, intermediate dry stage: Secondary | ICD-10-CM | POA: Diagnosis not present

## 2018-07-30 DIAGNOSIS — H4710 Unspecified papilledema: Secondary | ICD-10-CM

## 2018-07-30 DIAGNOSIS — Z1231 Encounter for screening mammogram for malignant neoplasm of breast: Secondary | ICD-10-CM

## 2018-07-30 DIAGNOSIS — Z6829 Body mass index (BMI) 29.0-29.9, adult: Secondary | ICD-10-CM | POA: Diagnosis not present

## 2018-07-30 DIAGNOSIS — H47333 Pseudopapilledema of optic disc, bilateral: Secondary | ICD-10-CM | POA: Diagnosis not present

## 2018-07-30 DIAGNOSIS — E113593 Type 2 diabetes mellitus with proliferative diabetic retinopathy without macular edema, bilateral: Secondary | ICD-10-CM | POA: Diagnosis not present

## 2018-07-30 DIAGNOSIS — E1139 Type 2 diabetes mellitus with other diabetic ophthalmic complication: Secondary | ICD-10-CM | POA: Diagnosis not present

## 2018-07-31 ENCOUNTER — Ambulatory Visit
Admission: RE | Admit: 2018-07-31 | Discharge: 2018-07-31 | Disposition: A | Discharge status: Home / Self Care | Payer: Medicare Other | Source: Ambulatory Visit | Attending: Internal Medicine | Admitting: Internal Medicine

## 2018-07-31 DIAGNOSIS — H4711 Papilledema associated with increased intracranial pressure: Secondary | ICD-10-CM | POA: Diagnosis not present

## 2018-07-31 DIAGNOSIS — H4710 Unspecified papilledema: Secondary | ICD-10-CM

## 2018-07-31 MED ORDER — GADOBENATE DIMEGLUMINE 529 MG/ML IV SOLN
15.0000 mL | Freq: Once | INTRAVENOUS | Status: AC | PRN
  Administered 2018-07-31: 15 mL via INTRAVENOUS

## 2018-08-06 ENCOUNTER — Other Ambulatory Visit: Payer: Self-pay | Admitting: Internal Medicine

## 2018-08-06 DIAGNOSIS — H353124 Nonexudative age-related macular degeneration, left eye, advanced atrophic with subfoveal involvement: Secondary | ICD-10-CM | POA: Diagnosis not present

## 2018-08-06 DIAGNOSIS — H353112 Nonexudative age-related macular degeneration, right eye, intermediate dry stage: Secondary | ICD-10-CM | POA: Diagnosis not present

## 2018-08-06 DIAGNOSIS — H47019 Ischemic optic neuropathy, unspecified eye: Secondary | ICD-10-CM

## 2018-08-06 DIAGNOSIS — E113593 Type 2 diabetes mellitus with proliferative diabetic retinopathy without macular edema, bilateral: Secondary | ICD-10-CM | POA: Diagnosis not present

## 2018-08-06 DIAGNOSIS — H47333 Pseudopapilledema of optic disc, bilateral: Secondary | ICD-10-CM | POA: Diagnosis not present

## 2018-08-06 LAB — HM DIABETES EYE EXAM

## 2018-08-07 ENCOUNTER — Ambulatory Visit (HOSPITAL_COMMUNITY)
Admission: RE | Admit: 2018-08-07 | Discharge: 2018-08-07 | Disposition: A | Discharge status: Home / Self Care | Payer: Medicare Other | Source: Ambulatory Visit | Attending: Internal Medicine | Admitting: Internal Medicine

## 2018-08-07 DIAGNOSIS — H4710 Unspecified papilledema: Secondary | ICD-10-CM | POA: Diagnosis not present

## 2018-08-07 DIAGNOSIS — H539 Unspecified visual disturbance: Secondary | ICD-10-CM | POA: Insufficient documentation

## 2018-08-07 DIAGNOSIS — G932 Benign intracranial hypertension: Secondary | ICD-10-CM | POA: Diagnosis not present

## 2018-08-07 DIAGNOSIS — H47019 Ischemic optic neuropathy, unspecified eye: Secondary | ICD-10-CM

## 2018-08-07 LAB — CSF CELL COUNT WITH DIFFERENTIAL
RBC COUNT CSF: 1 /mm3 — AB
Tube #: 4
WBC, CSF: 1 /mm3 (ref 0–5)

## 2018-08-07 LAB — PROTEIN, CSF: TOTAL PROTEIN, CSF: 39 mg/dL (ref 15–45)

## 2018-08-07 MED ORDER — ACETAMINOPHEN 325 MG PO TABS
650.0000 mg | ORAL_TABLET | ORAL | Status: DC | PRN
  Administered 2018-08-07: 650 mg via ORAL
  Filled 2018-08-07: qty 2

## 2018-08-07 MED ORDER — LIDOCAINE HCL 1 % IJ SOLN
INTRAMUSCULAR | Status: AC
  Filled 2018-08-07: qty 20

## 2018-08-07 NOTE — Discharge Instructions (Signed)
Lumbar Puncture, Care After °Refer to this sheet in the next few weeks. These instructions provide you with information on caring for yourself after your procedure. Your health care provider may also give you more specific instructions. Your treatment has been planned according to current medical practices, but problems sometimes occur. Call your health care provider if you have any problems or questions after your procedure. °What can I expect after the procedure? °After your procedure, it is typical to have the following sensations: °· Mild discomfort or pain at the insertion site. °· Mild headache that is relieved with pain medicines. ° °Follow these instructions at home: ° °· Avoid lifting anything heavier than 10 lb (4.5 kg) for at least 12 hours after the procedure. °· Drink enough fluids to keep your urine clear or pale yellow. °Contact a health care provider if: °· You have fever or chills. °· You have nausea or vomiting. °· You have a headache that lasts for more than 2 days. °Get help right away if: °· You have any numbness or tingling in your legs. °· You are unable to control your bowel or bladder. °· You have bleeding or swelling in your back at the insertion site. °· You are dizzy or faint. °This information is not intended to replace advice given to you by your health care provider. Make sure you discuss any questions you have with your health care provider. °Document Released: 12/01/2013 Document Revised: 05/03/2016 Document Reviewed: 08/04/2013 °Elsevier Interactive Patient Education © 2017 [employer] ° °

## 2018-08-12 ENCOUNTER — Telehealth: Payer: Self-pay | Admitting: Diagnostic Neuroimaging

## 2018-08-12 ENCOUNTER — Encounter: Payer: Self-pay | Admitting: Diagnostic Neuroimaging

## 2018-08-12 ENCOUNTER — Encounter: Payer: Self-pay | Admitting: *Deleted

## 2018-08-12 ENCOUNTER — Ambulatory Visit (INDEPENDENT_AMBULATORY_CARE_PROVIDER_SITE_OTHER): Payer: Medicare Other | Admitting: Diagnostic Neuroimaging

## 2018-08-12 VITALS — BP 118/68 | HR 60 | Ht 64.0 in | Wt 163.8 lb

## 2018-08-12 DIAGNOSIS — G932 Benign intracranial hypertension: Secondary | ICD-10-CM

## 2018-08-12 NOTE — Telephone Encounter (Signed)
Ringing in ears can be related to idiopathic intracranial hypertension (pseudotumor cerebri). Should improve with acetazolamide. -VRP

## 2018-08-12 NOTE — Telephone Encounter (Signed)
Patient was seen today by Dr. Leta Baptist. She forgot to ask him if ringing in her ears could be coming from the optical nerve.

## 2018-08-12 NOTE — Progress Notes (Signed)
[facility]  PATIENT: Frances Brown DOB: 1946-08-25  REFERRING CLINICIAN: Dione Housekeeper, MD HISTORY FROM: patient  REASON FOR VISIT: new consult    HISTORICAL  CHIEF COMPLAINT:  Chief Complaint  Patient presents with  . New Patient (Initial Visit)    Referral from Dr. Brigitte Pulse  . Papilledema    Bilateral.      HISTORY OF PRESENT ILLNESS:   72 year old female here for evaluation of papilledema.  Over the last few weeks patient has noted some right eye blurred vision.  She had a routine eye exam by Dr. Tye Savoy (ophthalmology) last week which showed papilledema.  MRI of the brain and lumbar puncture were obtained.  Opening pressure was slightly elevated at 22 cm water.  Therefore patient was started on acetazolamide and referred here for further evaluation.   No recent accidents injuries or traumas.  No recent infections.  No other triggering factors.  No headaches.  No slurred speech or trouble talking.  She has some ringing in the ears.    REVIEW OF SYSTEMS: Full 14 system review of systems performed and negative with exception of: Only as per HPI.  ALLERGIES: Allergies  Allergen Reactions  . Janumet Xr [Sitagliptin-Metformin Hcl Er] Swelling  . Zocor [Simvastatin] Swelling  . Sulfa Antibiotics   . Trimethoprim     Rash / hives that are painful  . Sulfur Rash    HOME MEDICATIONS: Outpatient Medications Prior to Visit  Medication Sig Dispense Refill  . acetaZOLAMIDE (DIAMOX) 500 MG capsule Take 500 mg by mouth 2 (two) times daily.     . bisoprolol (ZEBETA) 10 MG tablet Take 10 mg by mouth daily.    . cholecalciferol (VITAMIN D) 1000 units tablet Take 1,000 Units by mouth daily.    . Coenzyme Q10 (COQ10) 100 MG CAPS Take 1 capsule by mouth daily.    Marland Kitchen loratadine (CLARITIN) 10 MG tablet Take 10 mg by mouth daily as needed for allergies.     . metFORMIN (GLUCOPHAGE) 500 MG tablet Take 500 mg by mouth 2 (two) times daily.     . Multiple  Vitamins-Minerals (PRESERVISION AREDS 2 PO) Take 2 tablets by mouth daily.    Marland Kitchen olmesartan (BENICAR) 20 MG tablet Take 20 mg by mouth daily.    . Omega-3 Fatty Acids (FISH OIL PO) Take by mouth. Taking 3 times a week    . pravastatin (PRAVACHOL) 80 MG tablet Take 1 tablet by mouth daily.    . vitamin B-12 (CYANOCOBALAMIN) 1000 MCG tablet Take 1,000 mcg by mouth daily.    Marland Kitchen esomeprazole (NEXIUM) 20 MG capsule Take 1 capsule (20 mg total) by mouth 2 (two) times daily before a meal. 180 capsule 1   Facility-Administered Medications Prior to Visit  Medication Dose Route Frequency Provider Last Rate Last Dose  . 0.9 %  sodium chloride infusion  500 mL Intravenous Continuous Gatha Mayer, MD        PAST MEDICAL HISTORY: Past Medical History:  Diagnosis Date  . Diabetes mellitus without complication ([place])   . Diverticulosis   . Fibrocystic breast disease   . Fundic gland polyps of stomach, benign 03/24/2015  . GERD (gastroesophageal reflux disease)   . Hx of colonic polyps 04/24/2017  . Hyperlipidemia   . Hypertension   . IBS (irritable bowel syndrome)   . Macular degeneration    vitreous hemorrhage  . Obesity   . Papilledema, both eyes 07/28/2018   Dr Lorrene Reid  . Tachycardia, unspecified 04/08/2017  pt. states she saw Dr. Heloise Ochoa for this no meds no procedures, wore monitor  . Urge incontinence     PAST SURGICAL HISTORY: Past Surgical History:  Procedure Laterality Date  . breast lymph node removed     right  . CHOLECYSTECTOMY    . COLONOSCOPY  06/2002, 04/10/2007   diverticulosis, colon polyps, external hemorrhoids  . KNEE ARTHROSCOPY     left  . PARTIAL HYSTERECTOMY    . SHOULDER SURGERY     right  . UPPER GASTROINTESTINAL ENDOSCOPY  09/27/11  . VIDEO BRONCHOSCOPY Bilateral 02/14/2015   Procedure: VIDEO BRONCHOSCOPY WITHOUT FLUORO;  Surgeon: Collene Gobble, MD;  Location: [facility];  Service: Cardiopulmonary;  Laterality: Bilateral;  . WISDOM TOOTH EXTRACTION       FAMILY HISTORY: Family History  Problem Relation Age of Onset  . Diabetes Father   . Heart failure Father   . Hypertension Mother   . Heart failure Mother   . Diabetes Mother   . Lung cancer Mother   . Stroke Mother   . Leukemia Unknown        grandfather  . Cirrhosis Unknown        grandmother  . Hypertension Sister   . Colon cancer Maternal Uncle 4  . Breast cancer Maternal Aunt     SOCIAL HISTORY: Social History   Socioeconomic History  . Marital status: Married    Spouse name: Not on file  . Number of children: Not on file  . Years of education: Not on file  . Highest education level: Not on file  Occupational History  . Not on file  Social Needs  . Financial resource strain: Not on file  . Food insecurity:    Worry: Not on file    Inability: Not on file  . Transportation needs:    Medical: Not on file    Non-medical: Not on file  Tobacco Use  . Smoking status: Never Smoker  . Smokeless tobacco: Never Used  Substance and Sexual Activity  . Alcohol use: No    Alcohol/week: 0.0 standard drinks  . Drug use: No  . Sexual activity: Yes    Birth control/protection: Post-menopausal  Lifestyle  . Physical activity:    Days per week: Not on file    Minutes per session: Not on file  . Stress: Not on file  Relationships  . Social connections:    Talks on phone: Not on file    Gets together: Not on file    Attends religious service: Not on file    Active member of club or organization: Not on file    Attends meetings of clubs or organizations: Not on file    Relationship status: Not on file  . Intimate partner violence:    Fear of current or ex partner: Not on file    Emotionally abused: Not on file    Physically abused: Not on file    Forced sexual activity: Not on file  Other Topics Concern  . Not on file  Social History Narrative   Has one great Merchandiser, retail, Oceanographer.  Lives home with husband.  Has 2 children.   Caffeine rare.       PHYSICAL EXAM  GENERAL EXAM/CONSTITUTIONAL: Vitals:  Vitals:   08/12/18 1058  BP: 118/68  Pulse: 60  Weight: 163 lb 12.8 oz (74.3 kg)  Height: 5\' 4"  (1.626 m)     Body mass index is 28.12 kg/m. Wt Readings from Last 3  Encounters:  08/12/18 163 lb 12.8 oz (74.3 kg)  07/14/18 165 lb 3.2 oz (74.9 kg)  05/19/18 167 lb 3.2 oz (75.8 kg)     Patient is in no distress; well developed, nourished and groomed; neck is supple  CARDIOVASCULAR:  Examination of carotid arteries is normal; no carotid bruits  Regular rate and rhythm, no murmurs  Examination of peripheral vascular system by observation and palpation is normal  EYES:  Ophthalmoscopic exam of optic discs and posterior segments is --> DIFFICULT TO VISUALIZE FUNDI; SLIGHT DISC MARGIN BLURRING  Visual Acuity Screening   Right eye Left eye Both eyes  Without correction:     With correction: 20/100 20/100      MUSCULOSKELETAL:  Gait, strength, tone, movements noted in Neurologic exam below  NEUROLOGIC: MENTAL STATUS:  No flowsheet data found.  awake, alert, oriented to person, place and time  recent and remote memory intact  normal attention and concentration  language fluent, comprehension intact, naming intact  fund of knowledge appropriate  CRANIAL NERVE:   2nd - DIFFICULT TO VISUALIZE FUNDI; SLIGHT DISC MARGIN BLURRING  2nd, 3rd, 4th, 6th - pupils equal and reactive to light, visual fields full to confrontation, extraocular muscles intact, no nystagmus  5th - facial sensation symmetric  7th - facial strength symmetric  8th - hearing intact  9th - palate elevates symmetrically, uvula midline  11th - shoulder shrug symmetric  12th - tongue protrusion midline  MOTOR:   normal bulk and tone, full strength in the BUE, BLE  SENSORY:   normal and symmetric to light touch, temperature, vibration  COORDINATION:   finger-nose-finger, fine finger movements  normal  REFLEXES:   deep tendon reflexes TRACE and symmetric  GAIT/STATION:   narrow based gait     DIAGNOSTIC DATA (LABS, IMAGING, TESTING) - I reviewed patient records, labs, notes, testing and imaging myself where available.  Lab Results  Component Value Date   WBC 5.2 08/08/2010   HGB 13.7 08/08/2010   HCT 39.1 08/08/2010   MCV 90.3 08/08/2010   PLT 204.0 08/08/2010      Component Value Date/Time   NA 136 12/21/2010 0936   K 3.9 12/21/2010 0936   CL 102 12/21/2010 0936   CO2 28 12/21/2010 0936   GLUCOSE 101 (H) 12/21/2010 0936   BUN 17 12/21/2010 0936   CREATININE 0.8 12/21/2010 0936   CALCIUM 9.4 12/21/2010 0936   PROT 6.8 08/08/2010 0942   ALBUMIN 4.0 08/08/2010 0942   AST 30 08/08/2010 0942   ALT 41 (H) 08/08/2010 0942   ALKPHOS 60 08/08/2010 0942   BILITOT 0.8 08/08/2010 0942   GFRNONAA 64.89 08/08/2010 0942   No results found for: CHOL, HDL, LDLCALC, LDLDIRECT, TRIG, CHOLHDL No results found for: HGBA1C No results found for: VITAMINB12 Lab Results  Component Value Date   TSH 1.57 12/21/2010    08/07/18 LP --> - Fluoroscopic guided lumbar puncture with opening pressure of 22 cm water. 9 cc clear CSF sent for appropriate laboratory studies.  07/31/18 MRI brain [I reviewed images myself and agree with interpretation. -VRP]  1. No acute intracranial abnormality or mass lesion. 2. Moderate chronic small vessel ischemic disease.    ASSESSMENT AND PLAN  72 y.o. year old female here with new onset of blurred vision, papilledema, mild opening pressure elevation on lumbar puncture.  Now on acetazolamide.   Dx: idiopathic intracranial hypertension (pseudotumor cerebri); papilledema + mild elevated opening pressure (22cm H2O)  1. IIH (idiopathic intracranial hypertension)  PLAN:  - continue acetazolamide 500mg  twice a day  - follow up serial eye exam with Dr. Tye Savoy (ophthalmology)  Return in about 6 months (around 02/10/2019). or sooner if  needed    Penni Bombard, MD 7/0/7867, 54:49 AM Certified in Neurology, Neurophysiology and [facility] [address], [place], [place] [address] [phone]

## 2018-08-12 NOTE — Telephone Encounter (Signed)
I spoke to pt and relayed that the 8th cranial nerve is related to vestibulo-cochlear hearing.  Most likely coincidental.  Bilateral, crackling/ sizzling sounds.  Will ask if related to her current problem.

## 2018-08-13 NOTE — Telephone Encounter (Signed)
Spoke to pt and relayed that the tinnitis could be releated to [place] and should improve with taking diamox.  She verbalized understanding and hoped so.

## 2018-08-15 DIAGNOSIS — H47333 Pseudopapilledema of optic disc, bilateral: Secondary | ICD-10-CM | POA: Diagnosis not present

## 2018-08-15 DIAGNOSIS — H353134 Nonexudative age-related macular degeneration, bilateral, advanced atrophic with subfoveal involvement: Secondary | ICD-10-CM | POA: Diagnosis not present

## 2018-08-15 DIAGNOSIS — H35373 Puckering of macula, bilateral: Secondary | ICD-10-CM | POA: Diagnosis not present

## 2018-08-15 DIAGNOSIS — E113593 Type 2 diabetes mellitus with proliferative diabetic retinopathy without macular edema, bilateral: Secondary | ICD-10-CM | POA: Diagnosis not present

## 2018-08-25 DIAGNOSIS — Z87440 Personal history of urinary (tract) infections: Secondary | ICD-10-CM | POA: Diagnosis not present

## 2018-08-25 DIAGNOSIS — N8110 Cystocele, unspecified: Secondary | ICD-10-CM | POA: Diagnosis not present

## 2018-08-25 DIAGNOSIS — N393 Stress incontinence (female) (male): Secondary | ICD-10-CM | POA: Diagnosis not present

## 2018-08-26 ENCOUNTER — Ambulatory Visit
Admission: RE | Admit: 2018-08-26 | Discharge: 2018-08-26 | Disposition: A | Discharge status: Home / Self Care | Payer: Medicare Other | Source: Ambulatory Visit | Attending: Internal Medicine | Admitting: Internal Medicine

## 2018-08-26 DIAGNOSIS — Z1231 Encounter for screening mammogram for malignant neoplasm of breast: Secondary | ICD-10-CM

## 2018-09-02 ENCOUNTER — Telehealth: Payer: Self-pay | Admitting: Gastroenterology

## 2018-09-02 NOTE — Telephone Encounter (Signed)
Frances Brown the pt is calling with an update and states she has had some improvement in symptoms but still has to be careful with what she eats. (I did advise her she should follow anti reflux precautions) She wants to know what else she can/should do.  Please advise.  See your note below.     ASSESSMENT AND PLAN: *GERD and hoarseness:  She is currently on nexium 20 mg daily.  She had discontinued it for a while because she thought it may be the cause of her tinnitus.  Even while off the medication the tinnitus continued.  I do not think her Nexium is causing it as she has been on it for a long time.  She continues to have reflux and hoarseness, which she may have just had a flare of her reflux while off of the medication.  Will increase to 20 mg twice daily for now.  She will call back in 3 to 4 weeks with an update

## 2018-09-02 NOTE — Telephone Encounter (Signed)
Patient states medication nexium seems to work good when she takes it at morning and at night but she still has to watch what she eats. Patient also states she still has flare ups but not as frequent. Patient calling to update APP Janett Billow and get advised on where to go from here.

## 2018-09-03 NOTE — Telephone Encounter (Signed)
Let's try to max her out for a few weeks with Nexium 40 mg twice daily.  That should really help the acid suppression.  Let's do that for 4 weeks if she is ok with it then she can either come back for follow-up or call us back again.  We would not plan to keep her on this regimen long-term, just until symptoms are under control for a while and then start to back off again.  Thank you,  Frances Brown

## 2018-09-03 NOTE — Telephone Encounter (Signed)
The pt has been advised via My Chart 

## 2018-09-12 DIAGNOSIS — H353134 Nonexudative age-related macular degeneration, bilateral, advanced atrophic with subfoveal involvement: Secondary | ICD-10-CM | POA: Diagnosis not present

## 2018-09-12 DIAGNOSIS — H47333 Pseudopapilledema of optic disc, bilateral: Secondary | ICD-10-CM | POA: Diagnosis not present

## 2018-09-12 DIAGNOSIS — H35373 Puckering of macula, bilateral: Secondary | ICD-10-CM | POA: Diagnosis not present

## 2018-09-12 DIAGNOSIS — E113593 Type 2 diabetes mellitus with proliferative diabetic retinopathy without macular edema, bilateral: Secondary | ICD-10-CM | POA: Diagnosis not present

## 2018-09-16 ENCOUNTER — Telehealth: Payer: Self-pay | Admitting: *Deleted

## 2018-09-16 ENCOUNTER — Other Ambulatory Visit (HOSPITAL_COMMUNITY): Payer: Self-pay | Admitting: Internal Medicine

## 2018-09-16 DIAGNOSIS — H4710 Unspecified papilledema: Secondary | ICD-10-CM

## 2018-09-16 NOTE — Telephone Encounter (Signed)
Received a call from patient's PCP, Dr Brigitte Pulse who stated he got a call from Dr Baird Cancer, ophthalmologist requesting the patient be followed up by neurology. Dr Baird Cancer stated her eye pressure has increased. Dr Brigitte Pulse is ordering an LP, and his office will schedule it in the next 2 days. Dr Brigitte Pulse requested the patient see Dr Leta Baptist this Friday following LP. FU scheduled for 11 am Fri, will call patient and inform her.   Spoke with patient who stated she saw Dr Baird Cancer last Friday. She stated she has not heard from her PCP about LP but she knew from Dr Baird Cancer she needed to see Dr Leta Baptist soon. This RN gave her FU appt date/time with Dr Leta Baptist and advised she should be hearing form Dr Raul Del office today about LP appointment. Advised she call for any questions before Friday as needed. She verbalized understanding, appreciation of call.

## 2018-09-17 ENCOUNTER — Ambulatory Visit (HOSPITAL_COMMUNITY)
Admission: RE | Admit: 2018-09-17 | Discharge: 2018-09-17 | Disposition: A | Discharge status: Home / Self Care | Payer: Medicare Other | Source: Ambulatory Visit | Attending: Internal Medicine | Admitting: Internal Medicine

## 2018-09-17 DIAGNOSIS — H4710 Unspecified papilledema: Secondary | ICD-10-CM | POA: Insufficient documentation

## 2018-09-17 MED ORDER — ACETAMINOPHEN 325 MG PO TABS
650.0000 mg | ORAL_TABLET | ORAL | Status: DC | PRN
  Filled 2018-09-17: qty 2

## 2018-09-17 NOTE — Discharge Instructions (Signed)
Lumbar Puncture, Care After °Refer to this sheet in the next few weeks. These instructions provide you with information on caring for yourself after your procedure. Your health care provider may also give you more specific instructions. Your treatment has been planned according to current medical practices, but problems sometimes occur. Call your health care provider if you have any problems or questions after your procedure. °What can I expect after the procedure? °After your procedure, it is typical to have the following sensations: °· Mild discomfort or pain at the insertion site. °· Mild headache that is relieved with pain medicines. ° °Follow these instructions at home: ° °· Avoid lifting anything heavier than 10 lb (4.5 kg) for at least 12 hours after the procedure. °· Drink enough fluids to keep your urine clear or pale yellow. °Contact a health care provider if: °· You have fever or chills. °· You have nausea or vomiting. °· You have a headache that lasts for more than 2 days. °Get help right away if: °· You have any numbness or tingling in your legs. °· You are unable to control your bowel or bladder. °· You have bleeding or swelling in your back at the insertion site. °· You are dizzy or faint. °This information is not intended to replace advice given to you by your health care provider. Make sure you discuss any questions you have with your health care provider. °Document Released: 12/01/2013 Document Revised: 05/03/2016 Document Reviewed: 08/04/2013 °Elsevier Interactive Patient Education © 2017 [employer] ° °

## 2018-09-17 NOTE — Telephone Encounter (Signed)
Opening pressure is normal. Continue acetazolamide. -VRP

## 2018-09-18 NOTE — Telephone Encounter (Signed)
Pt appt tomorrow.

## 2018-09-19 ENCOUNTER — Encounter: Payer: Self-pay | Admitting: Diagnostic Neuroimaging

## 2018-09-19 ENCOUNTER — Ambulatory Visit (INDEPENDENT_AMBULATORY_CARE_PROVIDER_SITE_OTHER): Payer: Medicare Other | Admitting: Diagnostic Neuroimaging

## 2018-09-19 VITALS — BP 118/73 | HR 58 | Ht 64.0 in | Wt 159.6 lb

## 2018-09-19 DIAGNOSIS — Z6828 Body mass index (BMI) 28.0-28.9, adult: Secondary | ICD-10-CM | POA: Diagnosis not present

## 2018-09-19 DIAGNOSIS — G629 Polyneuropathy, unspecified: Secondary | ICD-10-CM | POA: Diagnosis not present

## 2018-09-19 DIAGNOSIS — G932 Benign intracranial hypertension: Secondary | ICD-10-CM

## 2018-09-19 DIAGNOSIS — G6289 Other specified polyneuropathies: Secondary | ICD-10-CM | POA: Diagnosis not present

## 2018-09-19 DIAGNOSIS — H9319 Tinnitus, unspecified ear: Secondary | ICD-10-CM | POA: Diagnosis not present

## 2018-09-19 DIAGNOSIS — H4710 Unspecified papilledema: Secondary | ICD-10-CM

## 2018-09-19 DIAGNOSIS — I10 Essential (primary) hypertension: Secondary | ICD-10-CM | POA: Diagnosis not present

## 2018-09-19 DIAGNOSIS — E781 Pure hyperglyceridemia: Secondary | ICD-10-CM | POA: Diagnosis not present

## 2018-09-19 MED ORDER — ACETAZOLAMIDE ER 500 MG PO CP12: 500.0000 mg | ORAL_CAPSULE | Freq: Three times a day (TID) | ORAL | 12 refills | Status: DC

## 2018-09-19 NOTE — Progress Notes (Signed)
[facility]  PATIENT: Frances Brown DOB: 1946-04-19  REFERRING CLINICIAN: Dione Housekeeper, MD HISTORY FROM: patient  REASON FOR VISIT: follow up    HISTORICAL  CHIEF COMPLAINT:  Chief Complaint  Patient presents with  . Follow-up    Rm 6, alone  . from LP per pcp    papilledema better per opth. Is tolerating diamox, numbness and tingling in fingers and toes.     HISTORY OF PRESENT ILLNESS:   UPDATE (09/19/18, VRP): Since last visit, eye exam notes bilateral papilledema has somewhat improved, but right eye papilledema is still severe. Repeat LP showed improved opening pressure (11 cm H2O). Vision sxs are improved. Tinnitus continues.  Tolerating acetazolamide (some paresthesia). Is losing some weight.   PRIOR HPI (08/12/18): 72 year old female here for evaluation of papilledema.  Over the last few weeks patient has noted some right eye blurred vision.  She had a routine eye exam by Dr. Tye Savoy (ophthalmology) last week which showed papilledema.  MRI of the brain and lumbar puncture were obtained.  Opening pressure was slightly elevated at 22 cm water.  Therefore patient was started on acetazolamide and referred here for further evaluation.   No recent accidents injuries or traumas.  No recent infections.  No other triggering factors.  No headaches.  No slurred speech or trouble talking.  She has some ringing in the ears.   REVIEW OF SYSTEMS: Full 14 system review of systems performed and negative: except fatigue blurred vision.    ALLERGIES: Allergies  Allergen Reactions  . Janumet Xr [Sitagliptin-Metformin Hcl Er] Swelling  . Zocor [Simvastatin] Swelling  . Sulfa Antibiotics   . Trimethoprim     Rash / hives that are painful  . Sulfur Rash    HOME MEDICATIONS: Outpatient Medications Prior to Visit  Medication Sig Dispense Refill  . acetaZOLAMIDE (DIAMOX) 500 MG capsule Take 500 mg by mouth 2 (two) times daily.     . bisoprolol (ZEBETA) 10 MG tablet  Take 10 mg by mouth daily.    . cholecalciferol (VITAMIN D) 1000 units tablet Take 1,000 Units by mouth daily.    . Coenzyme Q10 (COQ10) 100 MG CAPS Take 1 capsule by mouth daily.    Marland Kitchen esomeprazole (NEXIUM) 20 MG capsule Take 20 mg by mouth 2 (two) times daily before a meal.    . loratadine (CLARITIN) 10 MG tablet Take 10 mg by mouth daily as needed for allergies.     . metFORMIN (GLUCOPHAGE) 500 MG tablet Take 500 mg by mouth 2 (two) times daily.     . Multiple Vitamins-Minerals (PRESERVISION AREDS 2 PO) Take 2 tablets by mouth daily.    . Omega-3 Fatty Acids (FISH OIL PO) Take by mouth. Taking 3 times a week    . pravastatin (PRAVACHOL) 80 MG tablet Take 1 tablet by mouth daily.    . vitamin B-12 (CYANOCOBALAMIN) 1000 MCG tablet Take 1,000 mcg by mouth daily.    Marland Kitchen olmesartan (BENICAR) 20 MG tablet Take 20 mg by mouth daily.     Facility-Administered Medications Prior to Visit  Medication Dose Route Frequency Provider Last Rate Last Dose  . 0.9 %  sodium chloride infusion  500 mL Intravenous Continuous Gatha Mayer, MD        PAST MEDICAL HISTORY: Past Medical History:  Diagnosis Date  . Diabetes mellitus without complication ([place])   . Diverticulosis   . Fibrocystic breast disease   . Fundic gland polyps of stomach, benign 03/24/2015  . GERD (  gastroesophageal reflux disease)   . Hx of colonic polyps 04/24/2017  . Hyperlipidemia   . Hypertension   . IBS (irritable bowel syndrome)   . Macular degeneration    vitreous hemorrhage  . Obesity   . Papilledema, both eyes 07/28/2018   Dr Lorrene Reid  . Tachycardia, unspecified 04/08/2017   pt. states she saw Dr. Heloise Ochoa for this no meds no procedures, wore monitor  . Urge incontinence     PAST SURGICAL HISTORY: Past Surgical History:  Procedure Laterality Date  . breast lymph node removed     right  . CHOLECYSTECTOMY    . COLONOSCOPY  06/2002, 04/10/2007   diverticulosis, colon polyps, external hemorrhoids  . KNEE  ARTHROSCOPY     left  . PARTIAL HYSTERECTOMY    . SHOULDER SURGERY     right  . UPPER GASTROINTESTINAL ENDOSCOPY  09/27/11  . VIDEO BRONCHOSCOPY Bilateral 02/14/2015   Procedure: VIDEO BRONCHOSCOPY WITHOUT FLUORO;  Surgeon: Collene Gobble, MD;  Location: [facility];  Service: Cardiopulmonary;  Laterality: Bilateral;  . WISDOM TOOTH EXTRACTION      FAMILY HISTORY: Family History  Problem Relation Age of Onset  . Diabetes Father   . Heart failure Father   . Hypertension Mother   . Heart failure Mother   . Diabetes Mother   . Lung cancer Mother   . Stroke Mother   . Leukemia Unknown        grandfather  . Cirrhosis Unknown        grandmother  . Hypertension Sister   . Colon cancer Maternal Uncle 28  . Breast cancer Maternal Aunt     SOCIAL HISTORY: Social History   Socioeconomic History  . Marital status: Married    Spouse name: Not on file  . Number of children: Not on file  . Years of education: Not on file  . Highest education level: Not on file  Occupational History  . Not on file  Social Needs  . Financial resource strain: Not on file  . Food insecurity:    Worry: Not on file    Inability: Not on file  . Transportation needs:    Medical: Not on file    Non-medical: Not on file  Tobacco Use  . Smoking status: Never Smoker  . Smokeless tobacco: Never Used  Substance and Sexual Activity  . Alcohol use: No    Alcohol/week: 0.0 standard drinks  . Drug use: No  . Sexual activity: Yes    Birth control/protection: Post-menopausal  Lifestyle  . Physical activity:    Days per week: Not on file    Minutes per session: Not on file  . Stress: Not on file  Relationships  . Social connections:    Talks on phone: Not on file    Gets together: Not on file    Attends religious service: Not on file    Active member of club or organization: Not on file    Attends meetings of clubs or organizations: Not on file    Relationship status: Not on file  . Intimate partner  violence:    Fear of current or ex partner: Not on file    Emotionally abused: Not on file    Physically abused: Not on file    Forced sexual activity: Not on file  Other Topics Concern  . Not on file  Social History Narrative   Has one great Merchandiser, retail, Oceanographer.  Lives home with husband.  Has 2  children.  Caffeine rare.       PHYSICAL EXAM  GENERAL EXAM/CONSTITUTIONAL: Vitals:  Vitals:   09/19/18 1059  BP: 118/73  Pulse: (!) 58  Weight: 159 lb 9.6 oz (72.4 kg)  Height: 5\' 4"  (1.626 m)   Body mass index is 27.4 kg/m. Wt Readings from Last 10 Encounters:  09/19/18 159 lb 9.6 oz (72.4 kg)  08/12/18 163 lb 12.8 oz (74.3 kg)  07/14/18 165 lb 3.2 oz (74.9 kg)  05/19/18 167 lb 3.2 oz (75.8 kg)  07/09/17 172 lb (78 kg)  04/17/17 172 lb (78 kg)  04/03/17 172 lb (78 kg)  03/20/17 170 lb 12.8 oz (77.5 kg)  01/07/17 162 lb (73.5 kg)  12/06/16 162 lb (73.5 kg)    Patient is in no distress; well developed, nourished and groomed; neck is supple  CARDIOVASCULAR:  Examination of carotid arteries is normal; no carotid bruits  Regular rate and rhythm, no murmurs  Examination of peripheral vascular system by observation and palpation is normal  EYES:  Ophthalmoscopic exam of optic discs and posterior segments is --> SLIGHT DISC MARGIN BLURRING No exam data present  MUSCULOSKELETAL:  Gait, strength, tone, movements noted in Neurologic exam below  NEUROLOGIC: MENTAL STATUS:  No flowsheet data found.  awake, alert, oriented to person, place and time  recent and remote memory intact  normal attention and concentration  language fluent, comprehension intact, naming intact  fund of knowledge appropriate  CRANIAL NERVE:   2nd - DIFFICULT TO VISUALIZE FUNDI; SLIGHT DISC MARGIN BLURRING  2nd, 3rd, 4th, 6th - pupils equal and reactive to light, visual fields full to confrontation, extraocular muscles intact, no nystagmus  5th - facial  sensation symmetric  7th - facial strength symmetric  8th - hearing intact  9th - palate elevates symmetrically, uvula midline  11th - shoulder shrug symmetric  12th - tongue protrusion midline  MOTOR:   normal bulk and tone, full strength in the BUE, BLE  SENSORY:   normal and symmetric to light touch, temperature, vibration  COORDINATION:   finger-nose-finger, fine finger movements normal  REFLEXES:   deep tendon reflexes TRACE and symmetric  GAIT/STATION:   narrow based gait     DIAGNOSTIC DATA (LABS, IMAGING, TESTING) - I reviewed patient records, labs, notes, testing and imaging myself where available.  Lab Results  Component Value Date   WBC 5.2 08/08/2010   HGB 13.7 08/08/2010   HCT 39.1 08/08/2010   MCV 90.3 08/08/2010   PLT 204.0 08/08/2010      Component Value Date/Time   NA 136 12/21/2010 0936   K 3.9 12/21/2010 0936   CL 102 12/21/2010 0936   CO2 28 12/21/2010 0936   GLUCOSE 101 (H) 12/21/2010 0936   BUN 17 12/21/2010 0936   CREATININE 0.8 12/21/2010 0936   CALCIUM 9.4 12/21/2010 0936   PROT 6.8 08/08/2010 0942   ALBUMIN 4.0 08/08/2010 0942   AST 30 08/08/2010 0942   ALT 41 (H) 08/08/2010 0942   ALKPHOS 60 08/08/2010 0942   BILITOT 0.8 08/08/2010 0942   GFRNONAA 64.89 08/08/2010 0942   No results found for: CHOL, HDL, LDLCALC, LDLDIRECT, TRIG, CHOLHDL No results found for: HGBA1C No results found for: VITAMINB12 Lab Results  Component Value Date   TSH 1.57 12/21/2010    08/07/18 LP --> - Fluoroscopic guided lumbar puncture with opening pressure of 22 cm water. 9 cc clear CSF sent for appropriate laboratory studies.  07/31/18 MRI brain [I reviewed images myself and agree with  interpretation. -VRP]  1. No acute intracranial abnormality or mass lesion. 2. Moderate chronic small vessel ischemic disease.  09/17/18 LP-->  - opening pressure of 11 cm water. - Successful lumbar puncture using fluoroscopy. Normal opening  pressure.   ASSESSMENT AND PLAN  72 y.o. year old female here with new onset of blurred vision, papilledema, mild opening pressure elevation on lumbar puncture.  Now on acetazolamide.   Dx: idiopathic intracranial hypertension (pseudotumor cerebri); papilledema + mild elevated opening pressure (22cm H2O on 08/07/18; 11cm H2O on 09/17/18)  1. IIH (idiopathic intracranial hypertension)   2. Papilledema      PLAN:  - increase acetazolamide to 500mg  three times a day (due to persistent severe right optic nerve edema; even through LP opening pressure has improved) - follow up serial eye exam with Dr. Tye Savoy (ophthalmology)  Return in about 3 months (around 12/20/2018). or sooner if needed    Penni Bombard, MD 31/49/7026, 37:85 AM Certified in Neurology, Neurophysiology and [facility] [address] [place], [place] [address] [phone]

## 2018-09-23 ENCOUNTER — Telehealth: Payer: Self-pay | Admitting: Diagnostic Neuroimaging

## 2018-09-23 NOTE — Telephone Encounter (Signed)
Pt has called so Dr Leta Baptist can be aware that DI:[id number] (DIAMOX) 500 MG capsule pt is going back to 2 and will no longer take 3 a day due  Diarrhea, headaches and tingling in her fingers and toes

## 2018-09-23 NOTE — Telephone Encounter (Signed)
Go back to acetazolamide 500mg  twice a day for now. -VRP

## 2018-09-23 NOTE — Telephone Encounter (Addendum)
Spoke with patient who stated she increased Diamox  Over the weekend. She developed diarrhea Sunday, and had a  headache yesterday. She also had tingling in her hands and feet that lasted all day. Previously the tingling was only short lived. Last night she only took 2 Diamox. She feels better today and denies headache. She called Dr Tye Savoy yesterday but has not heard back she has a follow up with him on Dec 6th. She talked to her pharmacist who told her the symptoms she was having are side effects to Diamox. This RN advised will let Dr Leta Baptist know of her side effects and that she has cut back to Diamox twice daily. Advised her this RN will call her back if Dr Leta Baptist has instructions for her.  Advised he that if he has no further instructions she should monitor her symptoms and call back if they worsen. She verbalized understanding, appreciation of call back.

## 2018-09-24 NOTE — Telephone Encounter (Signed)
Frances Brown informing patient Dr Leta Baptist stated she can stay on Diamox 500 mg twice daily for now. Advised she monitor her symptoms and please call for any concerns, worsening symptoms. Advised her Dr Leta Baptist will see what Dr Tye Savoy reports when he sees her in Dec. Left office number for any questions.

## 2018-09-25 NOTE — Telephone Encounter (Signed)
LVM requesting call back.

## 2018-09-25 NOTE — Telephone Encounter (Signed)
Pt is asking for a call back from [place] @ [phone]

## 2018-09-26 DIAGNOSIS — N3000 Acute cystitis without hematuria: Secondary | ICD-10-CM | POA: Diagnosis not present

## 2018-09-26 DIAGNOSIS — R300 Dysuria: Secondary | ICD-10-CM | POA: Diagnosis not present

## 2018-10-10 DIAGNOSIS — N3000 Acute cystitis without hematuria: Secondary | ICD-10-CM | POA: Diagnosis not present

## 2018-10-20 DIAGNOSIS — Z6828 Body mass index (BMI) 28.0-28.9, adult: Secondary | ICD-10-CM | POA: Diagnosis not present

## 2018-10-20 DIAGNOSIS — G932 Benign intracranial hypertension: Secondary | ICD-10-CM | POA: Diagnosis not present

## 2018-10-20 DIAGNOSIS — R42 Dizziness and giddiness: Secondary | ICD-10-CM | POA: Diagnosis not present

## 2018-11-14 DIAGNOSIS — H47333 Pseudopapilledema of optic disc, bilateral: Secondary | ICD-10-CM | POA: Diagnosis not present

## 2018-11-14 DIAGNOSIS — E113593 Type 2 diabetes mellitus with proliferative diabetic retinopathy without macular edema, bilateral: Secondary | ICD-10-CM | POA: Diagnosis not present

## 2018-11-14 DIAGNOSIS — H35373 Puckering of macula, bilateral: Secondary | ICD-10-CM | POA: Diagnosis not present

## 2018-11-14 DIAGNOSIS — H353134 Nonexudative age-related macular degeneration, bilateral, advanced atrophic with subfoveal involvement: Secondary | ICD-10-CM | POA: Diagnosis not present

## 2018-11-24 DIAGNOSIS — G932 Benign intracranial hypertension: Secondary | ICD-10-CM | POA: Diagnosis not present

## 2018-11-24 DIAGNOSIS — R300 Dysuria: Secondary | ICD-10-CM | POA: Diagnosis not present

## 2018-11-24 DIAGNOSIS — K219 Gastro-esophageal reflux disease without esophagitis: Secondary | ICD-10-CM | POA: Diagnosis not present

## 2018-11-24 DIAGNOSIS — E7849 Other hyperlipidemia: Secondary | ICD-10-CM | POA: Diagnosis not present

## 2018-11-24 DIAGNOSIS — E781 Pure hyperglyceridemia: Secondary | ICD-10-CM | POA: Diagnosis not present

## 2018-11-24 DIAGNOSIS — I10 Essential (primary) hypertension: Secondary | ICD-10-CM | POA: Diagnosis not present

## 2018-11-24 DIAGNOSIS — Z6828 Body mass index (BMI) 28.0-28.9, adult: Secondary | ICD-10-CM | POA: Diagnosis not present

## 2018-11-24 DIAGNOSIS — E1139 Type 2 diabetes mellitus with other diabetic ophthalmic complication: Secondary | ICD-10-CM | POA: Diagnosis not present

## 2018-12-15 DIAGNOSIS — H35373 Puckering of macula, bilateral: Secondary | ICD-10-CM | POA: Diagnosis not present

## 2018-12-15 DIAGNOSIS — H47333 Pseudopapilledema of optic disc, bilateral: Secondary | ICD-10-CM | POA: Diagnosis not present

## 2019-01-12 ENCOUNTER — Ambulatory Visit: Payer: Medicare Other | Admitting: Diagnostic Neuroimaging

## 2019-01-16 DIAGNOSIS — H47333 Pseudopapilledema of optic disc, bilateral: Secondary | ICD-10-CM | POA: Diagnosis not present

## 2019-01-16 DIAGNOSIS — H35373 Puckering of macula, bilateral: Secondary | ICD-10-CM | POA: Diagnosis not present

## 2019-01-16 DIAGNOSIS — E113593 Type 2 diabetes mellitus with proliferative diabetic retinopathy without macular edema, bilateral: Secondary | ICD-10-CM | POA: Diagnosis not present

## 2019-01-16 DIAGNOSIS — H353134 Nonexudative age-related macular degeneration, bilateral, advanced atrophic with subfoveal involvement: Secondary | ICD-10-CM | POA: Diagnosis not present

## 2019-01-20 DIAGNOSIS — H838X3 Other specified diseases of inner ear, bilateral: Secondary | ICD-10-CM | POA: Diagnosis not present

## 2019-01-20 DIAGNOSIS — H9313 Tinnitus, bilateral: Secondary | ICD-10-CM | POA: Diagnosis not present

## 2019-01-20 DIAGNOSIS — H903 Sensorineural hearing loss, bilateral: Secondary | ICD-10-CM | POA: Diagnosis not present

## 2019-01-20 DIAGNOSIS — H6121 Impacted cerumen, right ear: Secondary | ICD-10-CM | POA: Diagnosis not present

## 2019-02-14 ENCOUNTER — Telehealth: Payer: Self-pay | Admitting: Physician Assistant

## 2019-02-14 MED ORDER — CIPROFLOXACIN HCL 500 MG PO TABS: 500.0000 mg | ORAL_TABLET | Freq: Two times a day (BID) | ORAL | 0 refills | Status: AC

## 2019-02-14 MED ORDER — METRONIDAZOLE 500 MG PO TABS: 500.0000 mg | ORAL_TABLET | Freq: Three times a day (TID) | ORAL | 0 refills | Status: AC

## 2019-02-14 NOTE — Telephone Encounter (Signed)
1012  02/14/19  Telephone call:  Patient called describing worsening left lower quadrant pain over the past week, now when she walks around it is feeling worse sometimes rated as an 8-10/10.  Has also noticed a change in stools towards diarrhea, no blood, no fever or chills.  Describes feeling similar to her previous "diverticulitis attacks".  Went ahead and prescribed the patient Ciprofloxacin 500 mg twice daily x7 days and Flagyl 500 mg 3 times daily x7 days.  [facility] to [facility] [address] per patient's request.  Discussed with patient that she will need to follow in our clinic within the next week to ensure that she is improving.  Patty-please call patient and make an appointment within 7-10 days with me or other APP  Ellouise Newer, [facility]

## 2019-02-16 ENCOUNTER — Telehealth: Payer: Self-pay

## 2019-02-16 NOTE — Telephone Encounter (Signed)
Appt made with Ellouise Newer PA for 02/25/19 at 10:45 am.  Pt notified

## 2019-02-16 NOTE — Telephone Encounter (Signed)
-----   Message from [place], [place] sent at 02/14/2019 10:14 AM EST ----- Regarding: LLQ pain Seif Teichert- see phone note- please make OV appt with me or other APP in 7-10 days to follow up with patient  Dr. Sherlean Foot phone note  Ellouise Newer, PA-C

## 2019-02-24 ENCOUNTER — Ambulatory Visit: Payer: Medicare Other | Admitting: Diagnostic Neuroimaging

## 2019-02-24 ENCOUNTER — Telehealth: Payer: Self-pay

## 2019-02-24 NOTE — Telephone Encounter (Signed)
Covid-19 travel screening questions  Have you traveled in the last 14 days? NO If yes where?  Do you now or have you had a fever in the last 14 days? NO  Do you have any respiratory symptoms of shortness of breath or cough now or in the last 14 days? NO  Do you have a medical history of Congestive Heart Failure? NO  Do you have any family members or close contacts with diagnosed or suspected Covid-19? NO

## 2019-02-25 ENCOUNTER — Ambulatory Visit: Payer: Medicare Other | Admitting: Physician Assistant

## 2019-03-16 ENCOUNTER — Ambulatory Visit: Payer: Medicare Other | Admitting: Diagnostic Neuroimaging

## 2019-05-07 DIAGNOSIS — H3530 Unspecified macular degeneration: Secondary | ICD-10-CM | POA: Diagnosis not present

## 2019-05-11 DIAGNOSIS — L821 Other seborrheic keratosis: Secondary | ICD-10-CM | POA: Diagnosis not present

## 2019-05-11 DIAGNOSIS — L814 Other melanin hyperpigmentation: Secondary | ICD-10-CM | POA: Diagnosis not present

## 2019-05-11 DIAGNOSIS — D225 Melanocytic nevi of trunk: Secondary | ICD-10-CM | POA: Diagnosis not present

## 2019-05-11 DIAGNOSIS — D1801 Hemangioma of skin and subcutaneous tissue: Secondary | ICD-10-CM | POA: Diagnosis not present

## 2019-05-11 DIAGNOSIS — R202 Paresthesia of skin: Secondary | ICD-10-CM | POA: Diagnosis not present

## 2019-05-25 ENCOUNTER — Ambulatory Visit: Payer: Medicare Other | Admitting: Cardiovascular Disease

## 2019-06-05 ENCOUNTER — Telehealth: Payer: Self-pay | Admitting: *Deleted

## 2019-06-05 NOTE — Telephone Encounter (Signed)
    COVID-19 Pre-Screening Questions:  . In the past 7 to 10 days have you had a cough,  shortness of breath, headache, congestion, fever (100 or greater) body aches, chills, sore throat, or sudden loss of taste or sense of smell? no . Have you been around anyone with known Covid 19. no . Have you been around anyone who is awaiting Covid 19 test results in the past 7 to 10 days? no . Have you been around anyone who has been exposed to Covid 19, or has mentioned symptoms of Covid 19 within the past 7 to 10 days? no  Pt on wait list for earlier appt with Dr. Angelena Form.  Appt scheduled for July 2,2020 at 8:00. Pt aware to wear mask to appointment and to arrive 15 minutes early.  Her husband has appointment with Dr. Angelena Form at 8:20 so will come with pt to appointment. Pt aware no one else can accompany them to appointment. Pt to call if she develops any symptoms prior to appointment.

## 2019-06-09 DIAGNOSIS — D229 Melanocytic nevi, unspecified: Secondary | ICD-10-CM | POA: Diagnosis not present

## 2019-06-09 DIAGNOSIS — L814 Other melanin hyperpigmentation: Secondary | ICD-10-CM | POA: Diagnosis not present

## 2019-06-09 DIAGNOSIS — L720 Epidermal cyst: Secondary | ICD-10-CM | POA: Diagnosis not present

## 2019-06-09 DIAGNOSIS — L821 Other seborrheic keratosis: Secondary | ICD-10-CM | POA: Diagnosis not present

## 2019-06-10 ENCOUNTER — Telehealth: Payer: Self-pay | Admitting: Cardiovascular Disease

## 2019-06-10 NOTE — Telephone Encounter (Signed)

## 2019-06-10 NOTE — Progress Notes (Signed)
Chief Complaint  Patient presents with  . Follow-up    HTN     History of Present Illness: 73 yo female with history of SVT, DM, HTN, HLD and palpitations felt to be due to PACs who is here today for follow up. I saw her in 2012 for evaluation of palpitations and leg pain in the setting of steroid use for poison ivy treatment. I arranged a 48 Holter monitor and an echo. Her echo showed normal LV size and function. Her Holter monitor showed occasional premature atrial contractions but no VT or atrial fibrillation. I saw her in 2018 and she had c/o more frequent palpitations. Cardiac monitor April 2018 with PACs and several short runs of SVT. Echo April 2018 with normal LV systolic function and mild MR.   She is here today for follow up. The patient denies any chest pain, dyspnea, palpitations, lower extremity edema, orthopnea, PND, dizziness, near syncope or syncope. She has been having vertigo type symptoms but no associated palpitations.   Primary Care Physician: Marton Redwood, MD  Past Medical History:  Diagnosis Date  . Diabetes mellitus without complication ([place])   . Diverticulosis   . Fibrocystic breast disease   . Fundic gland polyps of stomach, benign 03/24/2015  . GERD (gastroesophageal reflux disease)   . Hx of colonic polyps 04/24/2017  . Hyperlipidemia   . Hypertension   . IBS (irritable bowel syndrome)   . Macular degeneration    vitreous hemorrhage  . Obesity   . Papilledema, both eyes 07/28/2018   Dr Lorrene Reid  . Tachycardia, unspecified 04/08/2017   pt. states she saw Dr. Heloise Ochoa for this no meds no procedures, wore monitor  . Urge incontinence     Past Surgical History:  Procedure Laterality Date  . breast lymph node removed     right  . CHOLECYSTECTOMY    . COLONOSCOPY  06/2002, 04/10/2007   diverticulosis, colon polyps, external hemorrhoids  . KNEE ARTHROSCOPY     left  . PARTIAL HYSTERECTOMY    . SHOULDER SURGERY     right  . UPPER  GASTROINTESTINAL ENDOSCOPY  09/27/11  . VIDEO BRONCHOSCOPY Bilateral 02/14/2015   Procedure: VIDEO BRONCHOSCOPY WITHOUT FLUORO;  Surgeon: Collene Gobble, MD;  Location: [facility];  Service: Cardiopulmonary;  Laterality: Bilateral;  . WISDOM TOOTH EXTRACTION      Current Outpatient Medications  Medication Sig Dispense Refill  . acetaZOLAMIDE (DIAMOX) 500 MG capsule Take 1 capsule (500 mg total) by mouth 3 (three) times daily. 90 capsule 12  . bisoprolol (ZEBETA) 10 MG tablet Take 10 mg by mouth daily.    . cholecalciferol (VITAMIN D) 1000 units tablet Take 1,000 Units by mouth daily.    . Coenzyme Q10 (COQ10) 100 MG CAPS Take 1 capsule by mouth daily.    Marland Kitchen esomeprazole (NEXIUM) 20 MG capsule Take 20 mg by mouth 2 (two) times daily before a meal.    . loratadine (CLARITIN) 10 MG tablet Take 10 mg by mouth daily as needed for allergies.     . metFORMIN (GLUCOPHAGE) 500 MG tablet Take 500 mg by mouth 2 (two) times daily.     . Multiple Vitamins-Minerals (PRESERVISION AREDS 2 PO) Take 1 tablet by mouth daily.     . pravastatin (PRAVACHOL) 80 MG tablet Take 1 tablet by mouth daily.    . vitamin B-12 (CYANOCOBALAMIN) 1000 MCG tablet Take 1,000 mcg by mouth daily.     Current Facility-Administered Medications  Medication Dose Route Frequency Provider  Last Rate Last Dose  . 0.9 %  sodium chloride infusion  500 mL Intravenous Continuous Gatha Mayer, MD        Allergies  Allergen Reactions  . Janumet Xr [Sitagliptin-Metformin Hcl Er] Swelling  . Zocor [Simvastatin] Swelling  . Sulfa Antibiotics   . Trimethoprim     Rash / hives that are painful  . Sulfur Rash    Social History   Socioeconomic History  . Marital status: Married    Spouse name: Not on file  . Number of children: Not on file  . Years of education: Not on file  . Highest education level: Not on file  Occupational History  . Not on file  Social Needs  . Financial resource strain: Not on file  . Food insecurity     Worry: Not on file    Inability: Not on file  . Transportation needs    Medical: Not on file    Non-medical: Not on file  Tobacco Use  . Smoking status: Never Smoker  . Smokeless tobacco: Never Used  Substance and Sexual Activity  . Alcohol use: No    Alcohol/week: 0.0 standard drinks  . Drug use: No  . Sexual activity: Yes    Birth control/protection: Post-menopausal  Lifestyle  . Physical activity    Days per week: Not on file    Minutes per session: Not on file  . Stress: Not on file  Relationships  . Social Herbalist on phone: Not on file    Gets together: Not on file    Attends religious service: Not on file    Active member of club or organization: Not on file    Attends meetings of clubs or organizations: Not on file    Relationship status: Not on file  . Intimate partner violence    Fear of current or ex partner: Not on file    Emotionally abused: Not on file    Physically abused: Not on file    Forced sexual activity: Not on file  Other Topics Concern  . Not on file  Social History Narrative   Has one great Merchandiser, retail, Oceanographer.  Lives home with husband.  Has 2 children.  Caffeine rare.      Family History  Problem Relation Age of Onset  . Diabetes Father   . Heart failure Father   . Hypertension Mother   . Heart failure Mother   . Diabetes Mother   . Lung cancer Mother   . Stroke Mother   . Leukemia Unknown        grandfather  . Cirrhosis Unknown        grandmother  . Hypertension Sister   . Colon cancer Maternal Uncle 34  . Breast cancer Maternal Aunt     Review of Systems:  As stated in the HPI and otherwise negative.   BP 128/72   Pulse 60   Ht 5\' 4"  (1.626 m)   Wt 164 lb 6.4 oz (74.6 kg)   SpO2 96%   BMI 28.22 kg/m   Physical Examination:  General: Well developed, well nourished, NAD  HEENT: OP clear, mucus membranes moist  SKIN: warm, dry. No rashes. Neuro: No focal deficits   Musculoskeletal: Muscle strength 5/5 all ext  Psychiatric: Mood and affect normal  Neck: No JVD, no carotid bruits, no thyromegaly, no lymphadenopathy.  Lungs:Clear bilaterally, no wheezes, rhonci, crackles Cardiovascular: Regular rate and rhythm. No murmurs, gallops or  rubs. Abdomen:Soft. Bowel sounds present. Non-tender.  Extremities: No lower extremity edema. Pulses are 2 + in the bilateral DP/PT.  Echo April 2018: Left ventricle: The cavity size was normal. Systolic function was   vigorous. The estimated ejection fraction was in the range of 65%   to 70%. Wall motion was normal; there were no regional wall   motion abnormalities. Doppler parameters are consistent with   abnormal left ventricular relaxation (grade 1 diastolic   dysfunction). Doppler parameters are consistent with elevated   ventricular end-diastolic filling pressure. - Aortic valve: There was no regurgitation. - Mitral valve: There was mild regurgitation. - Left atrium: The atrium was normal in size. - Right ventricle: Systolic function was normal. - Right atrium: The atrium was normal in size. - Tricuspid valve: There was trivial regurgitation. - Pericardium, extracardiac: The pericardium was normal in   appearance.  EKG:  EKG is ordered today. The ekg ordered today demonstrates NSR, rate 60 bpm  Recent Labs: No results found for requested labs within last 8760 hours.    Wt Readings from Last 3 Encounters:  06/11/19 164 lb 6.4 oz (74.6 kg)  09/19/18 159 lb 9.6 oz (72.4 kg)  08/12/18 163 lb 12.8 oz (74.3 kg)    Other studies Reviewed: Additional studies/ records that were reviewed today include: . Review of the above records demonstrates:   Assessment and Plan:   1. PALPITATIONS/PAC/Paroxysmal SVT: Echo April 2018 with normal LV size and function. Rare palpitations. Will continue beta blocker. She will avoid stimulants.      2. HTN: BP is well controlled. No changes  Current medicines are reviewed at  length with the patient today.  The patient does not have concerns regarding medicines.  The following changes have been made:  no change  Labs/ tests ordered today include:   Orders Placed This Encounter  Procedures  . EKG 12-Lead     Disposition:   FU with me in 12  months   Signed, Lauree Chandler, MD 06/11/2019 8:39 AM    [facility] [address], [place], [place]  [address] Phone: [phone]; Fax: [phone]

## 2019-06-11 ENCOUNTER — Other Ambulatory Visit: Payer: Self-pay

## 2019-06-11 ENCOUNTER — Ambulatory Visit (INDEPENDENT_AMBULATORY_CARE_PROVIDER_SITE_OTHER): Payer: Medicare Other | Admitting: Cardiovascular Disease

## 2019-06-11 ENCOUNTER — Encounter: Payer: Self-pay | Admitting: Cardiovascular Disease

## 2019-06-11 VITALS — BP 128/72 | HR 60 | Ht 64.0 in | Wt 164.4 lb

## 2019-06-11 DIAGNOSIS — I491 Atrial premature depolarization: Secondary | ICD-10-CM | POA: Diagnosis not present

## 2019-06-11 DIAGNOSIS — I10 Essential (primary) hypertension: Secondary | ICD-10-CM | POA: Diagnosis not present

## 2019-06-11 DIAGNOSIS — E1139 Type 2 diabetes mellitus with other diabetic ophthalmic complication: Secondary | ICD-10-CM | POA: Diagnosis not present

## 2019-06-11 DIAGNOSIS — E7849 Other hyperlipidemia: Secondary | ICD-10-CM | POA: Diagnosis not present

## 2019-06-11 NOTE — Patient Instructions (Signed)
Medication Instructions:  Your provider recommends that you continue on your current medications as directed. Please refer to the Current Medication list given to you today.    Labwork: None  Testing/Procedures: None  Follow-Up: Your provider wants you to follow-up in: 1 year with Dr. McAlhany. You will receive a reminder letter in the mail two months in advance. If you don't receive a letter, please call our office to schedule the follow-up appointment.    Any Other Special Instructions Will Be Listed Below (If Applicable).     If you need a refill on your cardiac medications before your next appointment, please call your pharmacy.   

## 2019-06-15 DIAGNOSIS — R82998 Other abnormal findings in urine: Secondary | ICD-10-CM | POA: Diagnosis not present

## 2019-06-15 DIAGNOSIS — I10 Essential (primary) hypertension: Secondary | ICD-10-CM | POA: Diagnosis not present

## 2019-06-18 ENCOUNTER — Encounter

## 2019-06-18 DIAGNOSIS — E785 Hyperlipidemia, unspecified: Secondary | ICD-10-CM | POA: Diagnosis not present

## 2019-06-18 DIAGNOSIS — E1129 Type 2 diabetes mellitus with other diabetic kidney complication: Secondary | ICD-10-CM | POA: Diagnosis not present

## 2019-06-18 DIAGNOSIS — E781 Pure hyperglyceridemia: Secondary | ICD-10-CM | POA: Diagnosis not present

## 2019-06-18 DIAGNOSIS — N183 Chronic kidney disease, stage 3 (moderate): Secondary | ICD-10-CM | POA: Diagnosis not present

## 2019-06-18 DIAGNOSIS — H3530 Unspecified macular degeneration: Secondary | ICD-10-CM | POA: Diagnosis not present

## 2019-06-18 DIAGNOSIS — R42 Dizziness and giddiness: Secondary | ICD-10-CM | POA: Diagnosis not present

## 2019-06-18 DIAGNOSIS — I129 Hypertensive chronic kidney disease with stage 1 through stage 4 chronic kidney disease, or unspecified chronic kidney disease: Secondary | ICD-10-CM | POA: Diagnosis not present

## 2019-06-18 DIAGNOSIS — Z1339 Encounter for screening examination for other mental health and behavioral disorders: Secondary | ICD-10-CM | POA: Diagnosis not present

## 2019-06-18 DIAGNOSIS — Z0000 Encounter for general adult medical examination without abnormal findings: Secondary | ICD-10-CM | POA: Diagnosis not present

## 2019-06-18 DIAGNOSIS — Z86010 Personal history of colonic polyps: Secondary | ICD-10-CM | POA: Diagnosis not present

## 2019-06-18 DIAGNOSIS — G932 Benign intracranial hypertension: Secondary | ICD-10-CM | POA: Diagnosis not present

## 2019-06-18 DIAGNOSIS — K219 Gastro-esophageal reflux disease without esophagitis: Secondary | ICD-10-CM | POA: Diagnosis not present

## 2019-06-18 DIAGNOSIS — Z1331 Encounter for screening for depression: Secondary | ICD-10-CM | POA: Diagnosis not present

## 2019-06-18 DIAGNOSIS — E1139 Type 2 diabetes mellitus with other diabetic ophthalmic complication: Secondary | ICD-10-CM | POA: Diagnosis not present

## 2019-06-19 ENCOUNTER — Other Ambulatory Visit: Payer: Self-pay

## 2019-06-19 NOTE — Patient Outreach (Signed)
[facility]) Care Management  06/19/2019  Sameera Betton [facility] 31-Oct-1946 [id number]   Referral Date: 06/19/2019 Referral Source: MD referral Referral Reason: Patient assistance with Vascepa due to severe hypertriglyceridemia and intolerance to Lovaza.     Outreach Attempt: spoke with patient.  She is able to verify HIPAA.  Discussed reason for referral.  She states that she has tried so many things for her triglycerides and she has been intolerant to them, so her doctor wrote for Vascepa but the medication was so expensive.  She states it was over $300 and she did not get it filled so her physician referred her for possible patient assistance.  Discussed [facility] services and how we could support her.  She is agreeable to pharmacy only services at this time.     Social: Patient lives in the home with her spouse.  She is independent with care and still drives some but states she has some macular degeneration.    Conditions: Patient admits to hypertriglyceridemia, HTN, DM-2, GERD, and macular degeneration.  Patient states her last A1c was 6.3 and no problems with her blood pressure control just her triglycerides despite all she has tried.   Medications: Patient states she takes medications as prescribed but has several intolerances to medications.   Appointments:  Patient saw PCP on yesterday via virtual visit.   Advanced Directives: Patient does have an advanced directive.     Plan: RN CM will refer to pharmacy for medication assistance.     Jone Baseman, RN, MSN [facility] Care Management Care Management Coordinator Direct Line [phone] Toll Free: [phone]  Fax: [phone]

## 2019-06-26 ENCOUNTER — Ambulatory Visit: Payer: Self-pay | Admitting: Pharmacist

## 2019-06-30 ENCOUNTER — Ambulatory Visit: Payer: Self-pay | Admitting: Pharmacist

## 2019-07-02 ENCOUNTER — Ambulatory Visit: Payer: Self-pay | Admitting: Pharmacist

## 2019-07-03 ENCOUNTER — Other Ambulatory Visit: Payer: Self-pay | Admitting: Pharmacist

## 2019-07-03 ENCOUNTER — Ambulatory Visit: Payer: Self-pay | Admitting: Pharmacist

## 2019-07-06 ENCOUNTER — Ambulatory Visit: Payer: Self-pay | Admitting: Pharmacist

## 2019-07-07 NOTE — Patient Outreach (Addendum)
[facility]) Care Management  [place]   07/03/2019  Purvi Ruehl [facility] [birth year] [id number]  Reason for referral: Medication Assistance with Vascepa  Referral source: [facility] RN Current insurance: [employer]  PMHx includes but not limited to:  HLD (hyperTG), HTN  Outreach:  Successful telephone call with Ms. Gupta.  HIPAA identifiers verified. Patient states Frances Brown is doing okay today.  Frances Brown is agreeable to review medications telephonically. Frances Brown reports tolerating current medications.  Frances Brown states Frances Brown has been unable to start Vascepa for her hypertriglyceridemia due to cost.  It appears patient has +$300 deductible, therefore current copay for #120 30-day supply is $365.  It will be around $125/month thereafter.  Frances Brown is currently on pravastatin for TG lowering therapy.  Frances Brown has tried and failed Tricor (swelling), Gemfibrozil (GI) and Lovaza (GI).   Frances Brown was on Lovaza for years, but discontinued due to GI intolerance.  Alleriges/intolerance updated in Epic.  Last trigylcerides were 360 on 06/04/19 per [facility] data.  Patient has never had a problem with her TG until Frances Brown approached menopause.  Unfortunately, there are no patient assistance programs for Vascepa.  PAN foundation is closed for medications that treat HLD/TG.  Will attempt to have patient apply for the [employer].  Allergies  Allergen Reactions  . Janumet Xr [Sitagliptin-Metformin Hcl Er] Swelling  . Tricor [Fenofibrate] Swelling  . Zocor [Simvastatin] Swelling  . Lovaza [Omega-3-Acid Ethyl Esters] Other (See Comments)    GI intolerance  . Sulfa Antibiotics   . Trimethoprim     Rash / hives that are painful  . Sulfur Rash    Medications Reviewed Today    Reviewed by Lavera Guise, [facility] (Pharmacist) on 07/03/19 at 1310  Med List Status: <None>  Medication Order Taking? Sig Documenting Provider Last Dose Status Informant  0.9 %  sodium chloride infusion [id number]   Gatha Mayer, MD  Active    acetaZOLAMIDE (DIAMOX) 500 MG capsule [id number] Yes Take 1 capsule (500 mg total) by mouth 3 (three) times daily. Penni Bombard, MD Taking Active            Med Note ([place] Sep 23, 2018 10:33 AM) 09/23/18 pt reported taking 2 x daily  bisoprolol (ZEBETA) 10 MG tablet [id number] Yes Take 10 mg by mouth daily. [provider] Taking Active   cholecalciferol (VITAMIN D) 1000 units tablet [id number] Yes Take 1,000 Units by mouth daily. [provider] Taking Active   Coenzyme Q10 (COQ10) 100 MG CAPS [id number] Yes Take 1 capsule by mouth daily. [provider] Taking Active   esomeprazole (NEXIUM) 20 MG capsule [id number] Yes Take 20 mg by mouth 2 (two) times daily before a meal. [provider] Taking Active   loratadine (CLARITIN) 10 MG tablet [id number] Yes Take 10 mg by mouth daily as needed for allergies.  Collene Gobble, MD Taking Active   metFORMIN (GLUCOPHAGE) 500 MG tablet [id number] Yes Take 500 mg by mouth 2 (two) times daily.  [provider] Taking Active   Multiple Vitamins-Minerals (PRESERVISION AREDS 2 PO) [id number] Yes Take 1 tablet by mouth daily.  [provider] Taking Active   pravastatin (PRAVACHOL) 80 MG tablet [id number] Yes Take 1 tablet by mouth daily. [provider] Taking Active   vitamin B-12 (CYANOCOBALAMIN) 1000 MCG tablet [id number] Yes Take 1,000 mcg by mouth daily. [provider] Taking Active           Medication Assistance Findings:  [employer]  Plan: . I will follow up with patient next week regarding Vascepa patient assistance options   Regina Eck, PharmD, [place]  [phone]

## 2019-07-08 ENCOUNTER — Other Ambulatory Visit: Payer: Self-pay | Admitting: Pharmacist

## 2019-07-08 NOTE — Patient Outreach (Signed)
[facility]) Care Management Frances Brown  07/08/2019  Frances Brown [facility] 1946/07/19 [id number]  Reason for referral: Medication Assistance with Vascepa  Referral source: [facility] RN Current insurance: [employer]  PMHx includes but not limited to:  HLD (hyperTG), HTN  Outreach:  Incoming telephone call from Ms. Pitones.  HIPAA identifiers verified. She is agreeable to review application for [employer] online.  She states she has access to a computer, therefore she would like to research the website.  Website information given to patient.  Encouraged patient to call back if she identifies any issues.  She states she will likely have to take OTC fish oil if this does not provide patient assistance for Vascepa   Previous notes from 07/03/19: She states she has been unable to start Vascepa for her hypertriglyceridemia due to cost.  It appears patient has +$300 deductible, therefore current copay for #120 30-day supply is $365.  It will be around $125/month thereafter.  She is currently on pravastatin for TG lowering therapy.  She has tried and failed Tricor (swelling), Gemfibrozil (GI) and Lovaza (GI).   She was on Lovaza for years, but discontinued due to GI intolerance.  Alleriges/intolerance updated in Epic.  Last trigylcerides were 360 on 06/04/19 per [facility] data.  Patient has never had a problem with her TG until she approached menopause.  Unfortunately, there are no patient assistance programs for Vascepa.  PAN foundation is closed for medications that treat HLD/TG.  Will attempt to have patient apply for the [employer].  PLAN: -I will follow up with patient in 2 weeks  Regina Eck, PharmD, [place]  [phone]

## 2019-07-09 ENCOUNTER — Ambulatory Visit: Payer: Self-pay | Admitting: Pharmacist

## 2019-07-14 DIAGNOSIS — H35373 Puckering of macula, bilateral: Secondary | ICD-10-CM | POA: Diagnosis not present

## 2019-07-14 DIAGNOSIS — H353134 Nonexudative age-related macular degeneration, bilateral, advanced atrophic with subfoveal involvement: Secondary | ICD-10-CM | POA: Diagnosis not present

## 2019-07-14 DIAGNOSIS — E113593 Type 2 diabetes mellitus with proliferative diabetic retinopathy without macular edema, bilateral: Secondary | ICD-10-CM | POA: Diagnosis not present

## 2019-07-15 ENCOUNTER — Ambulatory Visit (INDEPENDENT_AMBULATORY_CARE_PROVIDER_SITE_OTHER): Payer: Medicare Other

## 2019-07-15 ENCOUNTER — Ambulatory Visit (INDEPENDENT_AMBULATORY_CARE_PROVIDER_SITE_OTHER): Payer: Medicare Other | Admitting: Orthopaedic Surgery

## 2019-07-15 ENCOUNTER — Other Ambulatory Visit: Payer: Self-pay

## 2019-07-15 ENCOUNTER — Encounter: Payer: Self-pay | Admitting: Orthopaedic Surgery

## 2019-07-15 VITALS — BP 151/83 | HR 72 | Ht 64.0 in | Wt 162.0 lb

## 2019-07-15 DIAGNOSIS — G8929 Other chronic pain: Secondary | ICD-10-CM

## 2019-07-15 DIAGNOSIS — M545 Low back pain: Secondary | ICD-10-CM

## 2019-07-15 DIAGNOSIS — M79604 Pain in right leg: Secondary | ICD-10-CM | POA: Insufficient documentation

## 2019-07-15 NOTE — Progress Notes (Signed)
Office Visit Note   Patient: Frances Brown           Date of Birth: 1946/09/18           MRN: [id number] Visit Date: 07/15/2019              Requested by: Marton Redwood, MD [address] [place],  [place] [address] PCP: Marton Redwood, MD   Assessment & Plan: Visit Diagnoses:  1. Chronic right-sided low back pain, unspecified whether sciatica present     Plan: Difficult to know if her present discomfort is related to her back or possibly her right hip.  Exam is benign today.  Some of her symptoms are referable to her back and she did have some degenerative changes in the lower lumbar spine by plain film.  She is also had some mild groin pain but none with internal or external rotation or loss of motion.  We will try a course of physical therapy for the lumbar spine and see if she can localize her pain more over the next 3 to 4 weeks and then return if it still a problem.  Might consider MRI scan of either her pelvis or her lumbar spine.  Office visit over 30 minutes 50% of the time in counseling  Follow-Up Instructions: No follow-ups on file.   Orders:  Orders Placed This Encounter  Procedures   XR Pelvis 1-2 Views   XR Lumbar Spine 2-3 Views   No orders of the defined types were placed in this encounter.     Procedures: No procedures performed   Clinical Data: No additional findings.   Subjective: Chief Complaint  Patient presents with   Right Leg - Pain  Patient presents today with proximal right leg pain X5 weeks. No known injury.Patient states that her leg hurts in her thigh. She said that if she stays on her feet it causes her entire leg to throb. She does state that she has some right buttock pain.   Jumped off the back of a golf cart about 4 to 5 weeks ago but did not have any acute pain and is not sure if that is at all related.  Pain seems to be in the right buttock and right thigh and really distal to the knee.  No numbness or tingling.  No groin pain.   Standing for a length of time will create some pain.  No problems referable to the left lower extremity.  No history of back pain  HPI  Review of Systems   Objective: Vital Signs: BP (!) 151/83    Pulse 72    Ht 5\' 4"  (1.626 m)    Wt 162 lb (73.5 kg)    BMI 27.81 kg/m   Physical Exam Constitutional:      Appearance: She is well-developed.  Eyes:     Pupils: Pupils are equal, round, and reactive to light.  Pulmonary:     Effort: Pulmonary effort is normal.  Skin:    General: Skin is warm and dry.  Neurological:     Mental Status: She is alert and oriented to person, place, and time.  Psychiatric:        Behavior: Behavior normal.     Ortho Exam awake alert and oriented x3.  Comfortable sitting and walking.  No limp.  Painless range of motion of right hip with internal and external rotation and no loss of motion.  No particular pain about the right knee and specifically no pain about  either medial lateral joint space.  Full extension of the knee.  Possibly very small effusion.  No popliteal pain.  No calf pain.  Straight leg raise negative no percussible tenderness of lumbar spine.  No localized areas of tenderness about the lateral aspect of her right hip.  Essentially asymptomatic presently  Specialty Comments:  No specialty comments available.  Imaging: No results found.   PMFS History: Patient Active Problem List   Diagnosis Date Noted   Pain in right leg 07/15/2019   Gastroesophageal reflux disease 07/14/2018   Abdominal pain, left lower quadrant 07/09/2017   Hx of colonic polyps 04/24/2017   Hoarseness 03/11/2015   Cough 01/28/2014   HTN (hypertension) 12/27/2011   Hyperlipidemia 12/27/2011   Obesity 08/16/2011   IBS (irritable bowel syndrome) 05/23/2011   PALPITATIONS 12/21/2010   Chronic left upper quadrant pain 08/08/2010   Past Medical History:  Diagnosis Date   Diabetes mellitus without complication ([place])    Diverticulosis    Fibrocystic  breast disease    Fundic gland polyps of stomach, benign 03/24/2015   GERD (gastroesophageal reflux disease)    Hx of colonic polyps 04/24/2017   Hyperlipidemia    Hypertension    IBS (irritable bowel syndrome)    Macular degeneration    vitreous hemorrhage   Obesity    Papilledema, both eyes 07/28/2018   Dr Lorrene Reid   Tachycardia, unspecified 04/08/2017   pt. states she saw Dr. Heloise Ochoa for this no meds no procedures, wore monitor   Urge incontinence     Family History  Problem Relation Age of Onset   Diabetes Father    Heart failure Father    Hypertension Mother    Heart failure Mother    Diabetes Mother    Lung cancer Mother    Stroke Mother    Leukemia Other        grandfather   Cirrhosis Other        grandmother   Hypertension Sister    Colon cancer Maternal Uncle 68   Breast cancer Maternal Aunt     Past Surgical History:  Procedure Laterality Date   breast lymph node removed     right   CHOLECYSTECTOMY     COLONOSCOPY  06/2002, 04/10/2007   diverticulosis, colon polyps, external hemorrhoids   KNEE ARTHROSCOPY     left   PARTIAL HYSTERECTOMY     SHOULDER SURGERY     right   UPPER GASTROINTESTINAL ENDOSCOPY  09/27/11   VIDEO BRONCHOSCOPY Bilateral 02/14/2015   Procedure: VIDEO BRONCHOSCOPY WITHOUT FLUORO;  Surgeon: Collene Gobble, MD;  Location: [facility];  Service: Cardiopulmonary;  Laterality: Bilateral;   WISDOM TOOTH EXTRACTION     Social History   Occupational History   Not on file  Tobacco Use   Smoking status: Never Smoker   Smokeless tobacco: Never Used  Substance and Sexual Activity   Alcohol use: No    Alcohol/week: 0.0 standard drinks   Drug use: No   Sexual activity: Yes    Birth control/protection: Post-menopausal

## 2019-07-21 ENCOUNTER — Ambulatory Visit: Payer: Self-pay | Admitting: Pharmacist

## 2019-07-24 ENCOUNTER — Other Ambulatory Visit: Payer: Self-pay | Admitting: Internal Medicine

## 2019-07-24 DIAGNOSIS — Z1231 Encounter for screening mammogram for malignant neoplasm of breast: Secondary | ICD-10-CM

## 2019-07-28 ENCOUNTER — Encounter: Payer: Self-pay | Admitting: Physical Therapy

## 2019-07-28 ENCOUNTER — Other Ambulatory Visit: Payer: Self-pay

## 2019-07-28 ENCOUNTER — Ambulatory Visit: Payer: Self-pay | Admitting: Pharmacist

## 2019-07-28 ENCOUNTER — Ambulatory Visit: Payer: Medicare Other | Attending: Orthopaedic Surgery | Admitting: Physical Therapy

## 2019-07-28 DIAGNOSIS — M25651 Stiffness of right hip, not elsewhere classified: Secondary | ICD-10-CM | POA: Diagnosis not present

## 2019-07-28 DIAGNOSIS — M25551 Pain in right hip: Secondary | ICD-10-CM | POA: Diagnosis not present

## 2019-07-28 DIAGNOSIS — M6281 Muscle weakness (generalized): Secondary | ICD-10-CM | POA: Insufficient documentation

## 2019-07-28 DIAGNOSIS — M545 Low back pain, unspecified: Secondary | ICD-10-CM

## 2019-07-28 NOTE — Therapy (Signed)
[place], [place], [address] Phone: [phone]   Fax:  [phone]  Physical Therapy Evaluation  Patient Details  Name: Frances Brown MRN: [id number] Date of Birth: [birth year] Referring Provider (PT): Dr. Durward Fortes    Encounter Date: 07/28/2019  PT End of Session - 07/28/19 1313    Visit Number  1    Number of Visits  8    Date for PT Re-Evaluation  09/22/19    PT Start Time  1218    PT Stop Time  1305    PT Time Calculation (min)  47 min    Activity Tolerance  Patient tolerated treatment well    Behavior During Therapy  [facility] for tasks assessed/performed       Past Medical History:  Diagnosis Date  . Diabetes mellitus without complication ([place])   . Diverticulosis   . Fibrocystic breast disease   . Fundic gland polyps of stomach, benign 03/24/2015  . GERD (gastroesophageal reflux disease)   . Hx of colonic polyps 04/24/2017  . Hyperlipidemia   . Hypertension   . IBS (irritable bowel syndrome)   . Macular degeneration    vitreous hemorrhage  . Obesity   . Papilledema, both eyes 07/28/2018   Dr Lorrene Reid  . Tachycardia, unspecified 04/08/2017   pt. states she saw Dr. Heloise Ochoa for this no meds no procedures, wore monitor  . Urge incontinence     Past Surgical History:  Procedure Laterality Date  . breast lymph node removed     right  . CHOLECYSTECTOMY    . COLONOSCOPY  06/2002, 04/10/2007   diverticulosis, colon polyps, external hemorrhoids  . KNEE ARTHROSCOPY     left  . PARTIAL HYSTERECTOMY    . SHOULDER SURGERY     right  . UPPER GASTROINTESTINAL ENDOSCOPY  09/27/11  . VIDEO BRONCHOSCOPY Bilateral 02/14/2015   Procedure: VIDEO BRONCHOSCOPY WITHOUT FLUORO;  Surgeon: Collene Gobble, MD;  Location: [facility];  Service: Cardiopulmonary;  Laterality: Bilateral;  . WISDOM TOOTH EXTRACTION      There were no vitals filed for this visit.   Subjective Assessment - 07/28/19  1223    Subjective  She presents for PT eval of pain in Rt LE.  Onset of pain in Rt LE about 2 mos ago as she was canning and gardening (standing, bending).  She has no back pain, has some posterolateral hip pain.  She describes pain in Rt anterior thigh.  Feels weak in her Rt leg when it hurts.  Hard to step up wit Rt LE.    Limitations  Lifting;Standing;Sitting;House hold activities   steps   Diagnostic tests  XR showed facet arthopathy and disc degeneration L4-L5, L5-S1    Patient Stated Goals  Reduce pain, prevent a fall    Currently in Pain?  Yes    Pain Score  5     Pain Location  Leg    Pain Orientation  Right    Pain Descriptors / Indicators  Throbbing;Aching    Pain Type  Acute pain    Pain Radiating Towards  stops at the knee, if she cont to stand, goes to shin    Pain Onset  More than a month ago    Pain Frequency  Intermittent    Aggravating Factors   bending, activity -not walking really    Pain Relieving Factors  sitting, resting, propping leg,    Effect of Pain on Daily Activities  limits her mobility  Multiple Pain Sites  No         [facility] PT Assessment - 07/28/19 0001      Assessment   Medical Diagnosis  R sided back pain     Referring Provider (PT)  Dr. Durward Fortes     Onset Date/Surgical Date  --   2 mos    Next MD Visit  after PT     Prior Therapy  no       Precautions   Precautions  None      Restrictions   Weight Bearing Restrictions  No      Balance Screen   Has the patient fallen in the past 6 months  No      [place] residence    Living Arrangements  Spouse/significant other    Type of [place] to enter    Entrance Stairs-Number of Steps  6    Entrance Stairs-Rails  Right      Prior Function   Level of [place]  Unemployed    Leisure  garden, cooking      Cognition   Overall Cognitive Status  Within Functional Limits for tasks assessed       Sensation   Light Touch  Appears Intact      Functional Tests   Functional tests  Squat;Single leg stance      Squat   Comments  WNL       Single Leg Stance   Comments  mild trendelenburg Rt LE       Posture/Postural Control   Posture Comments  not remarkable       AROM   Lumbar Flexion  WFL    Lumbar Extension  WFL    Lumbar - Right Side Bend  WFL    Lumbar - Left Side Bend  WFL    Lumbar - Right Rotation  min tightness no pain on R     Lumbar - Left Rotation  min tightness no pain on Rt       PROM   Overall PROM Comments  pain with Rt hip ER>IR, tight in ER to about 35 deg       Strength   Right Hip Flexion  4/5    Right Hip Extension  3+/5    Right Hip External Rotation   3+/5    Right Hip Internal Rotation  4+/5    Right Hip ABduction  4-/5    Left Hip Flexion  4/5    Left Hip Extension  3+/5    Left Hip ABduction  4/5    Right Knee Flexion  5/5    Right Knee Extension  5/5    Left Knee Flexion  5/5    Left Knee Extension  5/5      Palpation   Spinal mobility  hypomobile in lumbar segments     Palpation comment  tender Rt superior glutesl, piriformis and lateral hip at [address]                 Objective measurements completed on examination: See above findings.      [facility] Adult PT Treatment/Exercise - 07/28/19 0001      Self-Care   Self-Care  Heat/Ice Application;Other Self-Care Comments    Heat/Ice Application  heat vs ice    Other Self-Care Comments   HEP, POC, anatomy       Knee/Hip  Exercises: Stretches   ITB Stretch  Right;2 reps    Piriformis Stretch  Both;3 reps    Piriformis Stretch Limitations  seated, supine             PT Education - 07/28/19 1312    Education Details  PT/POC, HEP, need to come more than 1 visit to ensure proper form and execution of HEP, other offerings here in clinic , Telehealth as option    Person(s) Educated  Patient    Methods  Explanation;Demonstration;Handout;Verbal cues    Comprehension   Verbalized understanding;Returned demonstration       PT Short Term Goals - 07/28/19 1313      PT SHORT TERM GOAL #1   Title  Pt will be I with HEP for Rt hip/trunk flexibility and core strength    Time  4    Period  Weeks    Status  New    Target Date  08/25/19      PT SHORT TERM GOAL #2   Title  Pt will be able to step up with Rt leg on small step (< 6 inch) without pain or difficulty    Time  4    Period  Weeks    Status  New    Target Date  08/25/19      PT SHORT TERM GOAL #3   Title  Pt will be able to understand the interrelationship of hip, lumbar spine as it relates to her symptoms    Time  4    Period  Weeks    Status  New    Target Date  08/25/19        PT Long Term Goals - 07/28/19 1315      PT LONG TERM GOAL #1   Title  Pt will improve FOTO score to 37% or better    Time  8    Period  Weeks    Status  New    Target Date  09/22/19      PT LONG TERM GOAL #2   Title  Pt will be able to walk into her home (6 steps ) reciprocally wthout pain increase, using 1 rail.    Time  8    Period  Weeks    Status  New    Target Date  09/22/19      PT LONG TERM GOAL #3   Title  Pt will demo hip strength to 4+/5 or more for full functional mobility and stability in all planes    Time  8    Period  Weeks    Status  New    Target Date  09/22/19      PT LONG TERM GOAL #4   Title  Pt will be I with HEP for LE and core, walking program.    Time  8    Period  Weeks    Status  New    Target Date  09/22/19             Plan - 07/28/19 1319    Clinical Impression Statement  Patient presents for low complexity eval of Rt sided low back, hip pain which is acute on chronic.  Signs and symptoms correlate to XR showing lumbar pathology in low lumbar spine, pain referred to Rt anterior thigh.  She has min weakness in her hips, core and some Rt hip ROM deficits which I believe can be addressed with 4-8 PT visits. She was agreeable, given HEP.  Personal Factors and  Comorbidities  Age;Comorbidity 1;Past/Current Experience;Comorbidity 2;Comorbidity 3+    Comorbidities  previous back injury, Diabetes, HTN    Examination-Activity Limitations  Lift;Stairs;Locomotion Level;Bend;Sit    Examination-Participation Restrictions  Cleaning;Community Activity;Yard Work    Stability/Clinical Decision Making  Stable/Uncomplicated    Designer, jewellery  Low    Rehab Potential  Excellent    PT Frequency  1x / week    PT Duration  8 weeks    PT Treatment/Interventions  ADLs/Self Care Home Management;Therapeutic activities;Patient/family education;Therapeutic exercise;Iontophoresis 4mg /ml Dexamethasone;Moist Heat;Cryotherapy;Ultrasound;Functional mobility training;Stair training;Balance training;Neuromuscular re-education;Manual techniques;Dry needling;Passive range of motion    PT Next Visit Plan  check hip stretches, add core, modaliteis for pain in Rt hip (piriformis, glute)    PT Home Exercise Plan  piriformis, lateral hip stretching    Consulted and Agree with Plan of Care  Patient       Patient will benefit from skilled therapeutic intervention in order to improve the following deficits and impairments:  Increased fascial restricitons, Decreased strength, Hypomobility, Difficulty walking, Impaired flexibility, Improper body mechanics, Decreased mobility  Visit Diagnosis: 1. Pain in right hip   2. Right-sided low back pain without sciatica, unspecified chronicity   3. Stiffness of right hip, not elsewhere classified   4. Muscle weakness (generalized)        Problem List Patient Active Problem List   Diagnosis Date Noted  . Pain in right leg 07/15/2019  . Gastroesophageal reflux disease 07/14/2018  . Abdominal pain, left lower quadrant 07/09/2017  . Hx of colonic polyps 04/24/2017  . Hoarseness 03/11/2015  . Cough 01/28/2014  . HTN (hypertension) 12/27/2011  . Hyperlipidemia 12/27/2011  . Obesity 08/16/2011  . IBS (irritable bowel syndrome)  05/23/2011  . PALPITATIONS 12/21/2010  . Chronic left upper quadrant pain 08/08/2010    PAA,JENNIFER 07/28/2019, 1:29 PM  [address] [place], [place], [address] Phone: [phone]   Fax:  [phone]  Name: Frances Brown MRN: [id number] Date of Birth: 14-Jul-1946   Raeford Razor, PT 07/28/19 1:29 PM Phone: [phone] Fax: [phone]

## 2019-08-03 ENCOUNTER — Ambulatory Visit: Payer: Medicare Other | Admitting: Cardiovascular Disease

## 2019-08-04 ENCOUNTER — Ambulatory Visit: Payer: Self-pay | Admitting: Pharmacist

## 2019-08-05 ENCOUNTER — Ambulatory Visit: Payer: Medicare Other | Admitting: Physical Therapy

## 2019-08-06 ENCOUNTER — Ambulatory Visit: Payer: Self-pay | Admitting: Pharmacist

## 2019-08-10 ENCOUNTER — Encounter

## 2019-08-12 ENCOUNTER — Other Ambulatory Visit: Payer: Self-pay

## 2019-08-12 ENCOUNTER — Encounter: Payer: Self-pay | Admitting: Physical Therapy

## 2019-08-12 ENCOUNTER — Ambulatory Visit: Payer: Medicare Other | Attending: Orthopaedic Surgery | Admitting: Physical Therapy

## 2019-08-12 DIAGNOSIS — M545 Low back pain, unspecified: Secondary | ICD-10-CM

## 2019-08-12 DIAGNOSIS — M6281 Muscle weakness (generalized): Secondary | ICD-10-CM

## 2019-08-12 DIAGNOSIS — M25551 Pain in right hip: Secondary | ICD-10-CM | POA: Diagnosis not present

## 2019-08-12 DIAGNOSIS — M25651 Stiffness of right hip, not elsewhere classified: Secondary | ICD-10-CM | POA: Insufficient documentation

## 2019-08-12 NOTE — Therapy (Signed)
[place], [place], [address] Phone: [phone]   Fax:  [phone]  Physical Therapy Treatment  Patient Details  Name: Frances Brown MRN: [id number] Date of Birth: 05-17-1946 Referring Provider (PT): Dr. Durward Fortes    Encounter Date: 08/12/2019  PT End of Session - 08/12/19 1336    Visit Number  2    Number of Visits  8    Date for PT Re-Evaluation  09/22/19    PT Start Time  1234    PT Stop Time  1330    PT Time Calculation (min)  56 min    Activity Tolerance  Patient tolerated treatment well    Behavior During Therapy  [facility] for tasks assessed/performed       Past Medical History:  Diagnosis Date  . Diabetes mellitus without complication ([place])   . Diverticulosis   . Fibrocystic breast disease   . Fundic gland polyps of stomach, benign 03/24/2015  . GERD (gastroesophageal reflux disease)   . Hx of colonic polyps 04/24/2017  . Hyperlipidemia   . Hypertension   . IBS (irritable bowel syndrome)   . Macular degeneration    vitreous hemorrhage  . Obesity   . Papilledema, both eyes 07/28/2018   Dr Lorrene Reid  . Tachycardia, unspecified 04/08/2017   pt. states she saw Dr. Heloise Ochoa for this no meds no procedures, wore monitor  . Urge incontinence     Past Surgical History:  Procedure Laterality Date  . breast lymph node removed     right  . CHOLECYSTECTOMY    . COLONOSCOPY  06/2002, 04/10/2007   diverticulosis, colon polyps, external hemorrhoids  . KNEE ARTHROSCOPY     left  . PARTIAL HYSTERECTOMY    . SHOULDER SURGERY     right  . UPPER GASTROINTESTINAL ENDOSCOPY  09/27/11  . VIDEO BRONCHOSCOPY Bilateral 02/14/2015   Procedure: VIDEO BRONCHOSCOPY WITHOUT FLUORO;  Surgeon: Collene Gobble, MD;  Location: [facility];  Service: Cardiopulmonary;  Laterality: Bilateral;  . WISDOM TOOTH EXTRACTION      There were no vitals filed for this visit.  Subjective Assessment - 08/12/19 1237     Subjective  It feels a bit better.  No pain right now.  I have been my exercises.    Diagnostic tests  XR showed facet arthopathy and disc degeneration L4-L5, L5-S1    Patient Stated Goals  Reduce pain, prevent a fall    Currently in Pain?  No/denies        [facility] Adult PT Treatment/Exercise - 08/12/19 0001      Lumbar Exercises: Supine   Bridge  10 reps    Other Supine Lumbar Exercises  clam supine green x 10 and then unilateral x 2x 10 each     Other Supine Lumbar Exercises  sidelying for Rt lateral hip, quadratus lumborum       Knee/Hip Exercises: Stretches   Active Hamstring Stretch  --    ITB Stretch  Right;3 reps    Piriformis Stretch  Both;3 reps    Piriformis Stretch Limitations  seated, supine      Knee/Hip Exercises: Aerobic   Nustep  6 min UE and :LE L 5       Knee/Hip Exercises: Sidelying   Hip ABduction  Strengthening;Right;1 set;20 reps    Clams  x 20       Modalities   Modalities  Moist Heat      Moist Heat Therapy   Number Minutes Moist  Heat  10 Minutes    Moist Heat Location  Lumbar Spine;Hip      Manual Therapy   Manual Therapy  Soft tissue mobilization;Passive ROM;Manual Traction    Soft tissue mobilization  sidelying to Rt posterolateral hip    IASTM RT glute med, lumbar paraspinals R    Passive ROM  Rt hip ER/IR with compression to glutes     Manual Traction  LAD Rt LE x3               PT Education - 08/12/19 1335    Education Details  MHP for pain/soreness, core strength    Person(s) Educated  Patient    Methods  Explanation;Demonstration    Comprehension  Verbalized understanding;Returned demonstration       PT Short Term Goals - 08/12/19 1249      PT SHORT TERM GOAL #1   Title  Pt will be I with HEP for Rt hip/trunk flexibility and core strength    Status  On-going      PT SHORT TERM GOAL #2   Title  Pt will be able to step up with Rt leg on small step (< 6 inch) without pain or difficulty    Status  On-going      PT SHORT  TERM GOAL #3   Title  Pt will be able to understand the interrelationship of hip, lumbar spine as it relates to her symptoms        PT Long Term Goals - 07/28/19 1315      PT LONG TERM GOAL #1   Title  Pt will improve FOTO score to 37% or better    Time  8    Period  Weeks    Status  New    Target Date  09/22/19      PT LONG TERM GOAL #2   Title  Pt will be able to walk into her home (6 steps ) reciprocally wthout pain increase, using 1 rail.    Time  8    Period  Weeks    Status  New    Target Date  09/22/19      PT LONG TERM GOAL #3   Title  Pt will demo hip strength to 4+/5 or more for full functional mobility and stability in all planes    Time  8    Period  Weeks    Status  New    Target Date  09/22/19      PT LONG TERM GOAL #4   Title  Pt will be I with HEP for LE and core, walking program.    Time  8    Period  Weeks    Status  New    Target Date  09/22/19            Plan - 08/12/19 1238    Clinical Impression Statement  Pt cont to have pain in Rt hip (post) and then sometimes into R knee and shin. Localized with palpation to L5-S1 and superior gluteal region. Given HEP for core and hip strengthening today.    PT Treatment/Interventions  ADLs/Self Care Home Management;Therapeutic activities;Patient/family education;Therapeutic exercise;Iontophoresis 4mg /ml Dexamethasone;Moist Heat;Cryotherapy;Ultrasound;Functional mobility training;Stair training;Balance training;Neuromuscular re-education;Manual techniques;Dry needling;Passive range of motion    PT Next Visit Plan  check hip stretches, add core, modaliteis for pain in Rt hip (piriformis, glute)    PT Home Exercise Plan  piriformis, lateral hip stretching    Consulted and Agree with Plan of Care  Patient       Patient will benefit from skilled therapeutic intervention in order to improve the following deficits and impairments:  Increased fascial restricitons, Decreased strength, Hypomobility, Difficulty  walking, Impaired flexibility, Improper body mechanics, Decreased mobility  Visit Diagnosis: Pain in right hip  Right-sided low back pain without sciatica, unspecified chronicity  Stiffness of right hip, not elsewhere classified  Muscle weakness (generalized)     Problem List Patient Active Problem List   Diagnosis Date Noted  . Pain in right leg 07/15/2019  . Gastroesophageal reflux disease 07/14/2018  . Abdominal pain, left lower quadrant 07/09/2017  . Hx of colonic polyps 04/24/2017  . Hoarseness 03/11/2015  . Cough 01/28/2014  . HTN (hypertension) 12/27/2011  . Hyperlipidemia 12/27/2011  . Obesity 08/16/2011  . IBS (irritable bowel syndrome) 05/23/2011  . PALPITATIONS 12/21/2010  . Chronic left upper quadrant pain 08/08/2010    PAA,JENNIFER 08/12/2019, 2:06 PM  [place], [place], [address] Phone: [phone]   Fax:  [phone]  Name: Frances Brown MRN: [id number] Date of Birth: [birth year]  Raeford Razor, PT 08/12/19 2:06 PM Phone: [phone] Fax: [phone]

## 2019-08-12 NOTE — Patient Instructions (Signed)
Step 1  Step 2  Supine Bridge reps: 10  sets: 2  hold: 5  daily: 1  weekly: 7 Setup  Begin lying on your back with your arms resting at your sides, your legs bent at the knees and your feet flat on the ground. Movement  Tighten your abdominals and slowly lift your hips off the floor into a bridge position, keeping your back straight. Tip  Make sure to keep your trunk stiff throughout the exercise and your arms flat on the floor. Step 1  Step 2  Hooklying Clamshell with Resistance reps: 10  sets: 2  hold: 5  daily: 1  weekly: 7 Setup  Begin by lying on your back with your knees bent, feet resting on the floor, and a resistance band or loop secured around your legs. Movement  Move your knees away from each other, creating tension in the band, then slowly return to the starting position and repeat. Tip  Make sure to not to arch your low back during the exercise. Step 1  Step 2  Clamshell reps: 10  sets: 2  hold: 5  daily: 1  weekly: 7 Setup  Begin lying on your side with your knees bent and your hips and shoulders stacked. Movement  Engage your abdominals and raise your top knee up toward the ceiling, then slowly return to the starting position and repeat.  Tip  Make sure to keep your core engaged and do not roll your hips forward or backward during the exercise.

## 2019-08-19 ENCOUNTER — Encounter: Payer: Self-pay | Admitting: Physical Therapy

## 2019-08-19 ENCOUNTER — Ambulatory Visit: Payer: Medicare Other | Admitting: Physical Therapy

## 2019-08-19 ENCOUNTER — Other Ambulatory Visit: Payer: Self-pay

## 2019-08-19 DIAGNOSIS — M6281 Muscle weakness (generalized): Secondary | ICD-10-CM | POA: Diagnosis not present

## 2019-08-19 DIAGNOSIS — M25551 Pain in right hip: Secondary | ICD-10-CM | POA: Diagnosis not present

## 2019-08-19 DIAGNOSIS — M25651 Stiffness of right hip, not elsewhere classified: Secondary | ICD-10-CM | POA: Diagnosis not present

## 2019-08-19 DIAGNOSIS — M545 Low back pain, unspecified: Secondary | ICD-10-CM

## 2019-08-19 NOTE — Therapy (Signed)
[place], [place], [address] Phone: [phone]   Fax:  [phone]  Physical Therapy Treatment  Patient Details  Name: Frances Brown MRN: [id number] Date of Birth: Apr 22, 1946 Referring Provider (PT): Dr. Durward Fortes    Encounter Date: 08/19/2019  PT End of Session - 08/19/19 1259    Visit Number  3    Number of Visits  8    Date for PT Re-Evaluation  09/22/19    PT Start Time  1229    PT Stop Time  1315    PT Time Calculation (min)  46 min    Activity Tolerance  Patient tolerated treatment well    Behavior During Therapy  [facility] for tasks assessed/performed       Past Medical History:  Diagnosis Date  . Diabetes mellitus without complication ([place])   . Diverticulosis   . Fibrocystic breast disease   . Fundic gland polyps of stomach, benign 03/24/2015  . GERD (gastroesophageal reflux disease)   . Hx of colonic polyps 04/24/2017  . Hyperlipidemia   . Hypertension   . IBS (irritable bowel syndrome)   . Macular degeneration    vitreous hemorrhage  . Obesity   . Papilledema, both eyes 07/28/2018   Dr Lorrene Reid  . Tachycardia, unspecified 04/08/2017   pt. states she saw Dr. Heloise Ochoa for this no meds no procedures, wore monitor  . Urge incontinence     Past Surgical History:  Procedure Laterality Date  . breast lymph node removed     right  . CHOLECYSTECTOMY    . COLONOSCOPY  06/2002, 04/10/2007   diverticulosis, colon polyps, external hemorrhoids  . KNEE ARTHROSCOPY     left  . PARTIAL HYSTERECTOMY    . SHOULDER SURGERY     right  . UPPER GASTROINTESTINAL ENDOSCOPY  09/27/11  . VIDEO BRONCHOSCOPY Bilateral 02/14/2015   Procedure: VIDEO BRONCHOSCOPY WITHOUT FLUORO;  Surgeon: Collene Gobble, MD;  Location: [facility];  Service: Cardiopulmonary;  Laterality: Bilateral;  . WISDOM TOOTH EXTRACTION      There were no vitals filed for this visit.  Subjective Assessment - 08/19/19 1230     Subjective  Can do steps better.  Pain only in Rt anterior knee, sometimes to shin.  None in back.  Did alot of standing yesterday.    Currently in Pain?  Yes    Pain Score  1     Pain Location  Knee    Pain Orientation  Right    Pain Descriptors / Indicators  Aching    Pain Type  Acute pain    Pain Onset  More than a month ago         [facility] Adult PT Treatment/Exercise - 08/19/19 0001      Self-Care   Other Self-Care Comments   body mechanics, lifting, gardening      Lumbar Exercises: Supine   Bridge  10 reps    Bridge with clamshell  10 reps    Other Supine Lumbar Exercises  clam supine blue x 10 and then unilateral x 2x 10 each       Knee/Hip Exercises: Stretches   Piriformis Stretch  Both;3 reps;30 seconds    Piriformis Stretch Limitations  knee to opp shoulder and then figure 4       Knee/Hip Exercises: Aerobic   Nustep  6 min LE only, L5       Knee/Hip Exercises: Standing   Other Standing Knee Exercises  sit to  stand x 15 with blue band thighs min UE needed     Other Standing Knee Exercises  hip hinging, sit to stand and dead lifts, incrased time and cueing due to lack of thoracic extension, lats       Knee/Hip Exercises: Sidelying   Hip ABduction  Strengthening;Right;1 set;20 reps    Clams  x 20 blue     Other Sidelying Knee/Hip Exercises  no band needed for Rt hip                PT Short Term Goals - 08/12/19 1249      PT SHORT TERM GOAL #1   Title  Pt will be I with HEP for Rt hip/trunk flexibility and core strength    Status  On-going      PT SHORT TERM GOAL #2   Title  Pt will be able to step up with Rt leg on small step (< 6 inch) without pain or difficulty    Status  On-going      PT SHORT TERM GOAL #3   Title  Pt will be able to understand the interrelationship of hip, lumbar spine as it relates to her symptoms        PT Long Term Goals - 07/28/19 1315      PT LONG TERM GOAL #1   Title  Pt will improve FOTO score to 37% or better     Time  8    Period  Weeks    Status  New    Target Date  09/22/19      PT LONG TERM GOAL #2   Title  Pt will be able to walk into her home (6 steps ) reciprocally wthout pain increase, using 1 rail.    Time  8    Period  Weeks    Status  New    Target Date  09/22/19      PT LONG TERM GOAL #3   Title  Pt will demo hip strength to 4+/5 or more for full functional mobility and stability in all planes    Time  8    Period  Weeks    Status  New    Target Date  09/22/19      PT LONG TERM GOAL #4   Title  Pt will be I with HEP for LE and core, walking program.    Time  8    Period  Weeks    Status  New    Target Date  09/22/19            Plan - 08/19/19 1233    Clinical Impression Statement  No complaint of hip or back pain during session.  Rt knee pain seems more related to joint issues in Rt knee rather than referred pain.  Poor lifting mechanics, worked on dead lift with 0-5 lbs using yard stick and wall for tactile cues.  Cont with POC.    PT Treatment/Interventions  ADLs/Self Care Home Management;Therapeutic activities;Patient/family education;Therapeutic exercise;Iontophoresis 4mg /ml Dexamethasone;Moist Heat;Cryotherapy;Ultrasound;Functional mobility training;Stair training;Balance training;Neuromuscular re-education;Manual techniques;Dry needling;Passive range of motion    PT Next Visit Plan  core, hip strength and flexibility.  modalities for pain in Rt hip (piriformis, glute) if needed, hip hinge    PT Home Exercise Plan  piriformis, lateral hip stretching, bridge, clam supine and then sidelying green band    Consulted and Agree with Plan of Care  Patient       Patient will benefit from skilled  therapeutic intervention in order to improve the following deficits and impairments:  Increased fascial restricitons, Decreased strength, Hypomobility, Difficulty walking, Impaired flexibility, Improper body mechanics, Decreased mobility  Visit Diagnosis: Pain in right  hip  Right-sided low back pain without sciatica, unspecified chronicity  Stiffness of right hip, not elsewhere classified  Muscle weakness (generalized)     Problem List Patient Active Problem List   Diagnosis Date Noted  . Pain in right leg 07/15/2019  . Gastroesophageal reflux disease 07/14/2018  . Abdominal pain, left lower quadrant 07/09/2017  . Hx of colonic polyps 04/24/2017  . Hoarseness 03/11/2015  . Cough 01/28/2014  . HTN (hypertension) 12/27/2011  . Hyperlipidemia 12/27/2011  . Obesity 08/16/2011  . IBS (irritable bowel syndrome) 05/23/2011  . PALPITATIONS 12/21/2010  . Chronic left upper quadrant pain 08/08/2010    Jiovani Mccammon 08/19/2019, 1:17 PM  [place], [place], [address] Phone: [phone]   Fax:  [phone]  Name: Frances Brown MRN: [id number] Date of Birth: 06-Jun-1946  Raeford Razor, PT 08/19/19 1:17 PM Phone: [phone] Fax: [phone]

## 2019-08-26 ENCOUNTER — Ambulatory Visit: Payer: Medicare Other | Admitting: Physical Therapy

## 2019-08-26 ENCOUNTER — Other Ambulatory Visit: Payer: Self-pay

## 2019-08-26 ENCOUNTER — Encounter: Payer: Self-pay | Admitting: Physical Therapy

## 2019-08-26 DIAGNOSIS — M25551 Pain in right hip: Secondary | ICD-10-CM | POA: Diagnosis not present

## 2019-08-26 DIAGNOSIS — M6281 Muscle weakness (generalized): Secondary | ICD-10-CM

## 2019-08-26 DIAGNOSIS — M25651 Stiffness of right hip, not elsewhere classified: Secondary | ICD-10-CM

## 2019-08-26 DIAGNOSIS — M545 Low back pain, unspecified: Secondary | ICD-10-CM

## 2019-08-26 NOTE — Therapy (Addendum)
[place], [place], [address] Phone: [phone]   Fax:  [phone]  Physical Therapy Treatment/Discharge   Patient Details  Name: Frances Brown MRN: [id number] Date of Birth: January 05, 1946 Referring Provider (PT): Dr. Durward Fortes    Encounter Date: 08/26/2019  PT End of Session - 08/26/19 1238    Visit Number  4    Number of Visits  8    Date for PT Re-Evaluation  09/22/19    PT Start Time  1232    PT Stop Time  1315    PT Time Calculation (min)  43 min    Activity Tolerance  Patient tolerated treatment well    Behavior During Therapy  [facility] for tasks assessed/performed       Past Medical History:  Diagnosis Date  . Diabetes mellitus without complication ([place])   . Diverticulosis   . Fibrocystic breast disease   . Fundic gland polyps of stomach, benign 03/24/2015  . GERD (gastroesophageal reflux disease)   . Hx of colonic polyps 04/24/2017  . Hyperlipidemia   . Hypertension   . IBS (irritable bowel syndrome)   . Macular degeneration    vitreous hemorrhage  . Obesity   . Papilledema, both eyes 07/28/2018   Dr Lorrene Reid  . Tachycardia, unspecified 04/08/2017   pt. states she saw Dr. Heloise Ochoa for this no meds no procedures, wore monitor  . Urge incontinence     Past Surgical History:  Procedure Laterality Date  . breast lymph node removed     right  . CHOLECYSTECTOMY    . COLONOSCOPY  06/2002, 04/10/2007   diverticulosis, colon polyps, external hemorrhoids  . KNEE ARTHROSCOPY     left  . PARTIAL HYSTERECTOMY    . SHOULDER SURGERY     right  . UPPER GASTROINTESTINAL ENDOSCOPY  09/27/11  . VIDEO BRONCHOSCOPY Bilateral 02/14/2015   Procedure: VIDEO BRONCHOSCOPY WITHOUT FLUORO;  Surgeon: Collene Gobble, MD;  Location: [facility];  Service: Cardiopulmonary;  Laterality: Bilateral;  . WISDOM TOOTH EXTRACTION      There were no vitals filed for this visit.  Subjective Assessment -  08/26/19 1237    Subjective  I can tell when I stand it hurts, can do about  30 min max.  I push it probably too much.  No pain today.    Currently in Pain?  No/denies          [facility] Adult PT Treatment/Exercise - 08/26/19 0001      Lumbar Exercises: Stretches   Lower Trunk Rotation  10 seconds    Lower Trunk Rotation Limitations  x 10     Piriformis Stretch  2 reps;30 seconds    Piriformis Stretch Limitations  knee to opp shoulder     Active Hamstring Stretch  Both;2 reps;30 seconds      Lumbar Exercises: Supine   Clam  10 reps    Clam Limitations  blue band unilateral     Bridge  10 reps    Bridge with clamshell  10 reps      Knee/Hip Exercises: Standing   Heel Raises  Both;1 set;20 reps    Lateral Step Up  Both;2 sets;15 reps;Hand Hold: 1;Step Height: 6"    Forward Step Up  Both;2 sets;15 reps;Hand Hold: 1;Step Height: 6"    Functional Squat  2 sets;10 reps             PT Education - 08/26/19 1251    Education Details  continued cues for exercise technique and rationale    Person(s) Educated  Patient    Methods  Explanation;Demonstration;Tactile cues    Comprehension  Verbalized understanding;Returned demonstration       PT Short Term Goals - 08/26/19 1255      PT SHORT TERM GOAL #1   Title  Pt will be I with HEP for Rt hip/trunk flexibility and core strength    Status  Achieved      PT SHORT TERM GOAL #2   Title  Pt will be able to step up with Rt leg on small step (< 6 inch) without pain or difficulty    Baseline  6 inch or more > 20 reps mild increase in pain    Status  Partially Met      PT SHORT TERM GOAL #3   Title  Pt will be able to understand the interrelationship of hip, lumbar spine as it relates to her symptoms    Status  Achieved        PT Long Term Goals - 08/26/19 1256      PT LONG TERM GOAL #1   Title  Pt will improve FOTO score to 37% or better    Status  Unable to assess      PT LONG TERM GOAL #2   Title  Pt will be able to  walk into her home (6 steps ) reciprocally wthout pain increase, using 1 rail.    Baseline  improving    Status  Partially Met      PT LONG TERM GOAL #3   Title  Pt will demo hip strength to 4+/5 or more for full functional mobility and stability in all planes    Status  On-going      PT LONG TERM GOAL #4   Title  Pt will be I with HEP for LE and core, walking program.    Status  On-going            Plan - 08/26/19 1256    Clinical Impression Statement  Pt with overlapping Rt knee issues with multiple reps of step ups.  No back pain at all during session. She would like to get back to walking outside now that the weather is cooler. She did have Rt Leg pain which has been assumed was coming from her back.  Her FOTO score is 40%.  She plans ot be on HOLD for a week to see if she can exercise indpendently and manage pain.    PT Treatment/Interventions  ADLs/Self Care Home Management;Therapeutic activities;Patient/family education;Therapeutic exercise;Iontophoresis 47m/ml Dexamethasone;Moist Heat;Cryotherapy;Ultrasound;Functional mobility training;Stair training;Balance training;Neuromuscular re-education;Manual techniques;Dry needling;Passive range of motion    PT Next Visit Plan  core, hip strength and flexibility.  modalities for pain in Rt hip (piriformis, glute) if needed, hip hinge    PT Home Exercise Plan  piriformis, does hamstring stretch, lateral hip stretching, bridge, clam supine and then sidelying green band    Consulted and Agree with Plan of Care  Patient       Patient will benefit from skilled therapeutic intervention in order to improve the following deficits and impairments:  Increased fascial restricitons, Decreased strength, Hypomobility, Difficulty walking, Impaired flexibility, Improper body mechanics, Decreased mobility  Visit Diagnosis: Pain in right hip  Right-sided low back pain without sciatica, unspecified chronicity  Stiffness of right hip, not elsewhere  classified  Muscle weakness (generalized)     Problem List Patient Active Problem List   Diagnosis Date Noted  .  Pain in right leg 07/15/2019  . Gastroesophageal reflux disease 07/14/2018  . Abdominal pain, left lower quadrant 07/09/2017  . Hx of colonic polyps 04/24/2017  . Hoarseness 03/11/2015  . Cough 01/28/2014  . HTN (hypertension) 12/27/2011  . Hyperlipidemia 12/27/2011  . Obesity 08/16/2011  . IBS (irritable bowel syndrome) 05/23/2011  . PALPITATIONS 12/21/2010  . Chronic left upper quadrant pain 08/08/2010    PAA,JENNIFER 08/26/2019, 1:36 PM  [facility] [address] [place], [place], [address] Phone: [phone]   Fax:  [phone]  Name: Frances Brown MRN: [id number] Date of Birth: [birth year]  Raeford Razor, PT 08/26/19 1:36 PM Phone: [phone] Fax: [phone]  PHYSICAL THERAPY DISCHARGE SUMMARY  Visits from Start of Care: 4  Current functional level related to goals / functional outcomes: See above for most recent    Remaining deficits: Did not test hip strength, none limiting function as she has been pain free while in PT.    Education / Equipment: Body mechanics related to lifting and gardening Posture, HEP   Plan: Patient agrees to discharge.  Patient goals were partially met. Patient is being discharged due to the patient's request.  ?????     Raeford Razor, PT 09/07/19 12:20 PM Phone: [phone] Fax: [phone]

## 2019-08-31 DIAGNOSIS — Z780 Asymptomatic menopausal state: Secondary | ICD-10-CM | POA: Diagnosis not present

## 2019-09-01 DIAGNOSIS — N3020 Other chronic cystitis without hematuria: Secondary | ICD-10-CM | POA: Diagnosis not present

## 2019-09-01 DIAGNOSIS — N8110 Cystocele, unspecified: Secondary | ICD-10-CM | POA: Diagnosis not present

## 2019-09-09 ENCOUNTER — Other Ambulatory Visit: Payer: Self-pay

## 2019-09-09 ENCOUNTER — Ambulatory Visit
Admission: RE | Admit: 2019-09-09 | Discharge: 2019-09-09 | Disposition: A | Discharge status: Home / Self Care | Payer: Medicare Other | Source: Ambulatory Visit | Attending: Internal Medicine | Admitting: Internal Medicine

## 2019-09-09 DIAGNOSIS — Z1231 Encounter for screening mammogram for malignant neoplasm of breast: Secondary | ICD-10-CM

## 2019-10-03 NOTE — Patient Outreach (Signed)
[facility]) Care Management [place]  10/03/2019  Zairah Arista [facility] March 21, 1946 [id number]  Reason for referral: medication assistance/management  [facility] pharmacy case is being closed due to the following reasons:  -Goals of care have been met.   Regina Eck, PharmD, [place]  [phone]

## 2019-10-26 DIAGNOSIS — Z124 Encounter for screening for malignant neoplasm of cervix: Secondary | ICD-10-CM | POA: Diagnosis not present

## 2019-11-09 DIAGNOSIS — G629 Polyneuropathy, unspecified: Secondary | ICD-10-CM | POA: Diagnosis not present

## 2019-11-09 DIAGNOSIS — E7849 Other hyperlipidemia: Secondary | ICD-10-CM | POA: Diagnosis not present

## 2019-12-18 DIAGNOSIS — E1129 Type 2 diabetes mellitus with other diabetic kidney complication: Secondary | ICD-10-CM | POA: Diagnosis not present

## 2019-12-18 DIAGNOSIS — E7849 Other hyperlipidemia: Secondary | ICD-10-CM | POA: Diagnosis not present

## 2019-12-18 DIAGNOSIS — M791 Myalgia, unspecified site: Secondary | ICD-10-CM | POA: Diagnosis not present

## 2020-01-08 DIAGNOSIS — H353134 Nonexudative age-related macular degeneration, bilateral, advanced atrophic with subfoveal involvement: Secondary | ICD-10-CM | POA: Diagnosis not present

## 2020-01-08 DIAGNOSIS — H35342 Macular cyst, hole, or pseudohole, left eye: Secondary | ICD-10-CM | POA: Diagnosis not present

## 2020-01-08 DIAGNOSIS — H35373 Puckering of macula, bilateral: Secondary | ICD-10-CM | POA: Diagnosis not present

## 2020-01-08 DIAGNOSIS — E113593 Type 2 diabetes mellitus with proliferative diabetic retinopathy without macular edema, bilateral: Secondary | ICD-10-CM | POA: Diagnosis not present

## 2020-01-15 ENCOUNTER — Telehealth: Payer: Self-pay | Admitting: Gastroenterology

## 2020-01-15 DIAGNOSIS — Z23 Encounter for immunization: Secondary | ICD-10-CM | POA: Diagnosis not present

## 2020-01-15 MED ORDER — CIPROFLOXACIN HCL 500 MG PO TABS: 500.0000 mg | ORAL_TABLET | Freq: Two times a day (BID) | ORAL | 0 refills | Status: AC

## 2020-01-15 MED ORDER — METRONIDAZOLE 500 MG PO TABS: 500.0000 mg | ORAL_TABLET | Freq: Three times a day (TID) | ORAL | 0 refills | Status: AC

## 2020-01-15 NOTE — Telephone Encounter (Signed)
Pt reported that she is having a colitis flare up and requested medication to be sent to pharmacy.

## 2020-01-15 NOTE — Telephone Encounter (Signed)
Per Janett Billow Zehr-PA ok to send flagy 500mg  TID x7 day and Cipro 500mg  BID x7 days. If symptoms are not better please contact office and schedule appointment. Pt has been informed. Pt voiced understanding.

## 2020-01-21 DIAGNOSIS — H35342 Macular cyst, hole, or pseudohole, left eye: Secondary | ICD-10-CM | POA: Diagnosis not present

## 2020-01-29 DIAGNOSIS — H35372 Puckering of macula, left eye: Secondary | ICD-10-CM | POA: Diagnosis not present

## 2020-01-29 DIAGNOSIS — H35342 Macular cyst, hole, or pseudohole, left eye: Secondary | ICD-10-CM | POA: Diagnosis not present

## 2020-02-10 ENCOUNTER — Ambulatory Visit: Payer: Medicare Other | Attending: Internal Medicine

## 2020-02-10 ENCOUNTER — Other Ambulatory Visit: Payer: Self-pay

## 2020-02-10 DIAGNOSIS — Z20822 Contact with and (suspected) exposure to covid-19: Secondary | ICD-10-CM

## 2020-02-11 ENCOUNTER — Telehealth: Payer: Self-pay

## 2020-02-11 LAB — NOVEL CORONAVIRUS, NAA: SARS-CoV-2, NAA: NOT DETECTED

## 2020-02-11 NOTE — Telephone Encounter (Signed)
Pt. Checking on COVID 19 results, not available yet. °

## 2020-02-11 NOTE — Telephone Encounter (Signed)
Patient called and she was told that her COVID-19 test was negative, not detected 02/10/20.  She verbalized understanding.

## 2020-02-17 DIAGNOSIS — H353134 Nonexudative age-related macular degeneration, bilateral, advanced atrophic with subfoveal involvement: Secondary | ICD-10-CM | POA: Diagnosis not present

## 2020-02-19 DIAGNOSIS — Z23 Encounter for immunization: Secondary | ICD-10-CM | POA: Diagnosis not present

## 2020-03-18 DIAGNOSIS — H6121 Impacted cerumen, right ear: Secondary | ICD-10-CM | POA: Diagnosis not present

## 2020-03-18 DIAGNOSIS — H903 Sensorineural hearing loss, bilateral: Secondary | ICD-10-CM | POA: Diagnosis not present

## 2020-03-18 DIAGNOSIS — H9313 Tinnitus, bilateral: Secondary | ICD-10-CM | POA: Diagnosis not present

## 2020-03-18 DIAGNOSIS — H838X3 Other specified diseases of inner ear, bilateral: Secondary | ICD-10-CM | POA: Diagnosis not present

## 2020-03-25 ENCOUNTER — Other Ambulatory Visit: Payer: Self-pay | Admitting: Internal Medicine

## 2020-03-25 DIAGNOSIS — Z1231 Encounter for screening mammogram for malignant neoplasm of breast: Secondary | ICD-10-CM

## 2020-03-30 ENCOUNTER — Other Ambulatory Visit: Payer: Self-pay | Admitting: Obstetrics & Gynecology

## 2020-03-30 DIAGNOSIS — R1032 Left lower quadrant pain: Secondary | ICD-10-CM

## 2020-04-01 ENCOUNTER — Ambulatory Visit
Admission: RE | Admit: 2020-04-01 | Discharge: 2020-04-01 | Disposition: A | Discharge status: Home / Self Care | Payer: Medicare Other | Source: Ambulatory Visit | Attending: Obstetrics & Gynecology | Admitting: Obstetrics & Gynecology

## 2020-04-01 DIAGNOSIS — R102 Pelvic and perineal pain: Secondary | ICD-10-CM | POA: Diagnosis not present

## 2020-04-01 DIAGNOSIS — R1032 Left lower quadrant pain: Secondary | ICD-10-CM

## 2020-04-05 DIAGNOSIS — N329 Bladder disorder, unspecified: Secondary | ICD-10-CM | POA: Diagnosis not present

## 2020-04-05 DIAGNOSIS — R1032 Left lower quadrant pain: Secondary | ICD-10-CM | POA: Diagnosis not present

## 2020-04-05 DIAGNOSIS — R102 Pelvic and perineal pain: Secondary | ICD-10-CM | POA: Diagnosis not present

## 2020-04-05 DIAGNOSIS — N3020 Other chronic cystitis without hematuria: Secondary | ICD-10-CM | POA: Diagnosis not present

## 2020-04-06 DIAGNOSIS — I10 Essential (primary) hypertension: Secondary | ICD-10-CM | POA: Diagnosis not present

## 2020-04-06 DIAGNOSIS — E1139 Type 2 diabetes mellitus with other diabetic ophthalmic complication: Secondary | ICD-10-CM | POA: Diagnosis not present

## 2020-04-06 DIAGNOSIS — E1129 Type 2 diabetes mellitus with other diabetic kidney complication: Secondary | ICD-10-CM | POA: Diagnosis not present

## 2020-04-06 DIAGNOSIS — E7849 Other hyperlipidemia: Secondary | ICD-10-CM | POA: Diagnosis not present

## 2020-04-06 DIAGNOSIS — M79676 Pain in unspecified toe(s): Secondary | ICD-10-CM | POA: Diagnosis not present

## 2020-04-06 DIAGNOSIS — N1831 Chronic kidney disease, stage 3a: Secondary | ICD-10-CM | POA: Diagnosis not present

## 2020-04-06 DIAGNOSIS — E781 Pure hyperglyceridemia: Secondary | ICD-10-CM | POA: Diagnosis not present

## 2020-04-14 ENCOUNTER — Encounter: Payer: Self-pay | Admitting: Internal Medicine

## 2020-04-14 DIAGNOSIS — K449 Diaphragmatic hernia without obstruction or gangrene: Secondary | ICD-10-CM | POA: Diagnosis not present

## 2020-04-14 DIAGNOSIS — R3121 Asymptomatic microscopic hematuria: Secondary | ICD-10-CM | POA: Diagnosis not present

## 2020-04-14 DIAGNOSIS — K5730 Diverticulosis of large intestine without perforation or abscess without bleeding: Secondary | ICD-10-CM | POA: Diagnosis not present

## 2020-04-14 DIAGNOSIS — N3020 Other chronic cystitis without hematuria: Secondary | ICD-10-CM | POA: Diagnosis not present

## 2020-04-18 ENCOUNTER — Telehealth: Payer: Self-pay | Admitting: Gastroenterology

## 2020-04-25 ENCOUNTER — Telehealth: Payer: Self-pay | Admitting: Gastroenterology

## 2020-04-25 DIAGNOSIS — N3020 Other chronic cystitis without hematuria: Secondary | ICD-10-CM | POA: Diagnosis not present

## 2020-04-25 NOTE — Telephone Encounter (Signed)
Patient called requesting to speak with nurse said she just had a ct scan order by another provider and is concerned with results. She will not be available from 2-3:30pm

## 2020-04-25 NOTE — Telephone Encounter (Signed)
Left message on machine to call back  

## 2020-04-26 NOTE — Telephone Encounter (Signed)
Left message on machine to call back --tried to reach pt on several occasions will await further communication from the pt

## 2020-04-29 NOTE — Telephone Encounter (Signed)
CT scan from [facility] sent to me  No GI problems seen though did have small hiatal hernia  Small gas in urinary bladder

## 2020-05-06 DIAGNOSIS — I129 Hypertensive chronic kidney disease with stage 1 through stage 4 chronic kidney disease, or unspecified chronic kidney disease: Secondary | ICD-10-CM | POA: Diagnosis not present

## 2020-05-06 DIAGNOSIS — N1831 Chronic kidney disease, stage 3a: Secondary | ICD-10-CM | POA: Diagnosis not present

## 2020-05-06 DIAGNOSIS — N3020 Other chronic cystitis without hematuria: Secondary | ICD-10-CM | POA: Diagnosis not present

## 2020-05-06 DIAGNOSIS — E1139 Type 2 diabetes mellitus with other diabetic ophthalmic complication: Secondary | ICD-10-CM | POA: Diagnosis not present

## 2020-05-10 DIAGNOSIS — L570 Actinic keratosis: Secondary | ICD-10-CM | POA: Diagnosis not present

## 2020-05-10 DIAGNOSIS — L308 Other specified dermatitis: Secondary | ICD-10-CM | POA: Diagnosis not present

## 2020-05-10 DIAGNOSIS — D1801 Hemangioma of skin and subcutaneous tissue: Secondary | ICD-10-CM | POA: Diagnosis not present

## 2020-05-10 DIAGNOSIS — D225 Melanocytic nevi of trunk: Secondary | ICD-10-CM | POA: Diagnosis not present

## 2020-05-10 DIAGNOSIS — L814 Other melanin hyperpigmentation: Secondary | ICD-10-CM | POA: Diagnosis not present

## 2020-05-18 ENCOUNTER — Other Ambulatory Visit: Payer: Self-pay

## 2020-05-18 ENCOUNTER — Ambulatory Visit (AMBULATORY_SURGERY_CENTER): Payer: Self-pay | Admitting: *Deleted

## 2020-05-18 VITALS — Ht 64.0 in | Wt 169.0 lb

## 2020-05-18 DIAGNOSIS — Z86010 Personal history of colonic polyps: Secondary | ICD-10-CM

## 2020-05-18 NOTE — Progress Notes (Signed)
No egg or soy allergy known to patient  No issues with past sedation with any surgeries  or procedures, no intubation problems  No diet pills per patient No home 02 use per patient  No blood thinners per patient  Pt denies issues with constipation  No A fib or A flutter  EMMI video sent to pt's e mail   Completed covid vaccine 02-19-2020  Due to the COVID-19 pandemic we are asking patients to follow these guidelines. Please only bring one care partner. Please be aware that your care partner may wait in the car in the parking lot or if they feel like they will be too hot to wait in the car, they may wait in the lobby on the 4th floor. All care partners are required to wear a mask the entire time (we do not have any that we can provide them), they need to practice social distancing, and we will do a Covid check for all patient's and care partners when you arrive. Also we will check their temperature and your temperature. If the care partner waits in their car they need to stay in the parking lot the entire time and we will call them on their cell phone when the patient is ready for discharge so they can bring the car to the front of the building. Also all patient's will need to wear a mask into building.

## 2020-05-22 ENCOUNTER — Encounter: Payer: Self-pay | Admitting: Certified Registered Nurse Anesthetist

## 2020-05-23 ENCOUNTER — Other Ambulatory Visit: Payer: Self-pay

## 2020-05-23 ENCOUNTER — Encounter: Payer: Self-pay | Admitting: Internal Medicine

## 2020-05-23 ENCOUNTER — Ambulatory Visit (AMBULATORY_SURGERY_CENTER): Payer: Medicare Other | Admitting: Internal Medicine

## 2020-05-23 VITALS — BP 154/98 | HR 61 | Temp 97.1°F | Resp 13 | Ht 64.0 in | Wt 169.0 lb

## 2020-05-23 DIAGNOSIS — Z86010 Personal history of colonic polyps: Secondary | ICD-10-CM

## 2020-05-23 DIAGNOSIS — K635 Polyp of colon: Secondary | ICD-10-CM

## 2020-05-23 DIAGNOSIS — D125 Benign neoplasm of sigmoid colon: Secondary | ICD-10-CM

## 2020-05-23 DIAGNOSIS — D123 Benign neoplasm of transverse colon: Secondary | ICD-10-CM

## 2020-05-23 DIAGNOSIS — E119 Type 2 diabetes mellitus without complications: Secondary | ICD-10-CM | POA: Diagnosis not present

## 2020-05-23 DIAGNOSIS — D122 Benign neoplasm of ascending colon: Secondary | ICD-10-CM

## 2020-05-23 MED ORDER — SODIUM CHLORIDE 0.9 % IV SOLN: 500.0000 mL | Freq: Once | INTRAVENOUS | Status: DC

## 2020-05-23 NOTE — Progress Notes (Signed)
Pt's states no medical or surgical changes since previsit or office visit.  VS CW  

## 2020-05-23 NOTE — Op Note (Signed)
[place] Patient Name: Frances Brown Procedure Date: 05/23/2020 1:37 PM MRN: [id number] Endoscopist: Gatha Mayer , MD Age: 74 Referring MD:  Date of Birth: 1946/07/30 Gender: Female Account #: [id number] Procedure:                Colonoscopy Indications:              High risk colon cancer surveillance: Personal                            history of sessile serrated colon polyp (10 mm or                            greater in size), Last colonoscopy: 2018 Medicines:                Propofol per Anesthesia, Monitored Anesthesia Care Procedure:                Pre-Anesthesia Assessment:                           - Prior to the procedure, a History and Physical                            was performed, and patient medications and                            allergies were reviewed. The patient's tolerance of                            previous anesthesia was also reviewed. The risks                            and benefits of the procedure and the sedation                            options and risks were discussed with the patient.                            All questions were answered, and informed consent                            was obtained. Prior Anticoagulants: The patient has                            taken no previous anticoagulant or antiplatelet                            agents. ASA Grade Assessment: II - A patient with                            mild systemic disease. After reviewing the risks                            and benefits, the patient was deemed in  satisfactory condition to undergo the procedure.                           After obtaining informed consent, the colonoscope                            was passed under direct vision. Throughout the                            procedure, the patient's blood pressure, pulse, and                            oxygen saturations were monitored continuously. The                             Colonoscope was introduced through the anus and                            advanced to the the cecum, identified by                            appendiceal orifice and ileocecal valve. The                            colonoscopy was performed without difficulty. The                            patient tolerated the procedure well. The quality                            of the bowel preparation was excellent. The bowel                            preparation used was Miralax via split dose                            instruction. The ileocecal valve, appendiceal                            orifice, and rectum were photographed. Scope In: 1:50:07 PM Scope Out: 2:05:52 PM Scope Withdrawal Time: 0 hours 12 minutes 51 seconds  Total Procedure Duration: 0 hours 15 minutes 45 seconds  Findings:                 The perianal and digital rectal examinations were                            normal.                           Four sessile polyps were found in the sigmoid                            colon, transverse colon and ascending colon. The  polyps were diminutive in size. These polyps were                            removed with a cold snare. Resection and retrieval                            were complete. Verification of patient                            identification for the specimen was done. Estimated                            blood loss was minimal.                           Multiple diverticula were found in the sigmoid                            colon and descending colon.                           The exam was otherwise without abnormality on                            direct and retroflexion views. Complications:            No immediate complications. Estimated Blood Loss:     Estimated blood loss was minimal. Impression:               - Four diminutive polyps in the sigmoid colon, in                            the transverse colon and in the ascending colon,                             removed with a cold snare. Resected and retrieved.                           - Diverticulosis in the sigmoid colon and in the                            descending colon.                           - The examination was otherwise normal on direct                            and retroflexion views.                           - Personal history of colonic polyps 5 and 10 mm                            ssp/a's 2018. Recommendation:           - Patient has a contact number  available for                            emergencies. The signs and symptoms of potential                            delayed complications were discussed with the                            patient. Return to normal activities tomorrow.                            Written discharge instructions were provided to the                            patient.                           - Resume previous diet.                           - Continue present medications.                           - Await pathology results.                           - No recommendation at this time regarding repeat                            colonoscopy due to age.                           - Trial of IB Gard for LLQ pain - exam today no                            tenderness w/ abd wall mm tension (neg Carnett's) Gatha Mayer, MD 05/23/2020 2:19:46 PM This report has been signed electronically.

## 2020-05-23 NOTE — Patient Instructions (Addendum)
I found and removed 4 tiny polyps today. Will see what they were and determine if you should have another routine colonoscopy - not sure at this point.  I also saw diverticulosis.  Nothing here explains the pain though sometimes the muscles of the colon will spasm and cause pain and perhaps more often with diverticulosis as the muscles are thicker in that condition.  You can purchase over the counter IB Donald Prose and try that to see if it helps the pain - it relieves colon muscle spasms.  I appreciate the opportunity to care for you. Gatha Mayer, MD, [facility]  Handouts provided on polyps and diverticulosis.   YOU HAD AN ENDOSCOPIC PROCEDURE TODAY AT [facility]:   Refer to the procedure report that was given to you for any specific questions about what was found during the examination.  If the procedure report does not answer your questions, please call your gastroenterologist to clarify.  If you requested that your care partner not be given the details of your procedure findings, then the procedure report has been included in a sealed envelope for you to review at your convenience later.  YOU SHOULD EXPECT: Some feelings of bloating in the abdomen. Passage of more gas than usual.  Walking can help get rid of the air that was put into your GI tract during the procedure and reduce the bloating. If you had a lower endoscopy (such as a colonoscopy or flexible sigmoidoscopy) you may notice spotting of blood in your stool or on the toilet paper. If you underwent a bowel prep for your procedure, you may not have a normal bowel movement for a few days.  Please Note:  You might notice some irritation and congestion in your nose or some drainage.  This is from the oxygen used during your procedure.  There is no need for concern and it should clear up in a day or so.  SYMPTOMS TO REPORT IMMEDIATELY:   Following lower endoscopy (colonoscopy or flexible sigmoidoscopy):  Excessive amounts of  blood in the stool  Significant tenderness or worsening of abdominal pains  Swelling of the abdomen that is new, acute  Fever of 100F or higher  For urgent or emergent issues, a gastroenterologist can be reached at any hour by calling [phone]. Do not use MyChart messaging for urgent concerns.    DIET:  We do recommend a small meal at first, but then you may proceed to your regular diet.  Drink plenty of fluids but you should avoid alcoholic beverages for 24 hours.  ACTIVITY:  You should plan to take it easy for the rest of today and you should NOT DRIVE or use heavy machinery until tomorrow (because of the sedation medicines used during the test).    FOLLOW UP: Our staff will call the number listed on your records 48-72 hours following your procedure to check on you and address any questions or concerns that you may have regarding the information given to you following your procedure. If we do not reach you, we will leave a message.  We will attempt to reach you two times.  During this call, we will ask if you have developed any symptoms of COVID 19. If you develop any symptoms (ie: fever, flu-like symptoms, shortness of breath, cough etc.) before then, please call [phone].  If you test positive for Covid 19 in the 2 weeks post procedure, please call and report this information to [place].    If any biopsies  were taken you will be contacted by phone or by letter within the next 1-3 weeks.  Please call us at [phone] if you have not heard about the biopsies in 3 weeks.    SIGNATURES/CONFIDENTIALITY: You and/or your care partner have signed paperwork which will be entered into your electronic medical record.  These signatures attest to the fact that that the information above on your After Visit Summary has been reviewed and is understood.  Full responsibility of the confidentiality of this discharge information lies with you and/or your care-partner.

## 2020-05-23 NOTE — Progress Notes (Signed)
Report given to PACU, vss 

## 2020-05-25 ENCOUNTER — Telehealth: Payer: Self-pay

## 2020-05-25 NOTE — Telephone Encounter (Signed)
  Follow up Call-  Call back number 05/23/2020  Post procedure Call Back phone  # [phone]  Permission to leave phone message Yes  Some recent data might be hidden     Patient questions:  Do you have a fever, pain , or abdominal swelling? No. Pain Score  0 *  Have you tolerated food without any problems? Yes.    Have you been able to return to your normal activities? Yes.    Do you have any questions about your discharge instructions: Diet   No. Medications  No. Follow up visit  No.  Do you have questions or concerns about your Care? No.  Actions: * If pain score is 4 or above: No action needed, pain <4.  1. Have you developed a fever since your procedure? no  2.   Have you had an respiratory symptoms (SOB or cough) since your procedure? no  3.   Have you tested positive for COVID 19 since your procedure no  4.   Have you had any family members/close contacts diagnosed with the COVID 19 since your procedure?  No  Pt reported she has not had a bowel movement yet.  I advised her that her colon was emptied when she took the prep.  She should be back to eating normally now.  To give it a few more days, and if no BM at that time to call back.  If she begins with abdominal pain or discomfort to all call back. maw   If yes to any of these questions please route to Joylene John, RN and Erenest Rasher, RN

## 2020-05-31 ENCOUNTER — Encounter: Payer: Self-pay | Admitting: Internal Medicine

## 2020-06-01 ENCOUNTER — Encounter: Payer: PRIVATE HEALTH INSURANCE | Admitting: Internal Medicine

## 2020-07-04 DIAGNOSIS — M859 Disorder of bone density and structure, unspecified: Secondary | ICD-10-CM | POA: Diagnosis not present

## 2020-07-04 DIAGNOSIS — E1139 Type 2 diabetes mellitus with other diabetic ophthalmic complication: Secondary | ICD-10-CM | POA: Diagnosis not present

## 2020-07-04 DIAGNOSIS — E7849 Other hyperlipidemia: Secondary | ICD-10-CM | POA: Diagnosis not present

## 2020-07-04 IMAGING — MG DIGITAL SCREENING BILATERAL MAMMOGRAM WITH TOMO AND CAD
8 series · 8 of 24 positions shown · non-contrast
Comparison: Previous exam(s).

CLINICAL DATA: Screening.

EXAM:
DIGITAL SCREENING BILATERAL MAMMOGRAM WITH TOMO AND CAD

[R CC synth-2D]
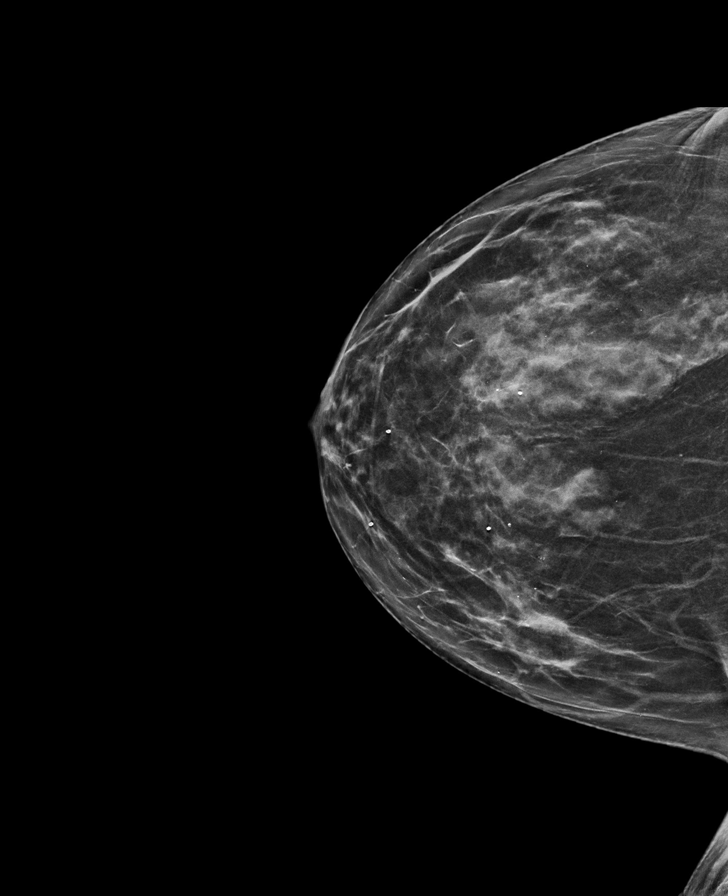

[L MLO synth-2D]
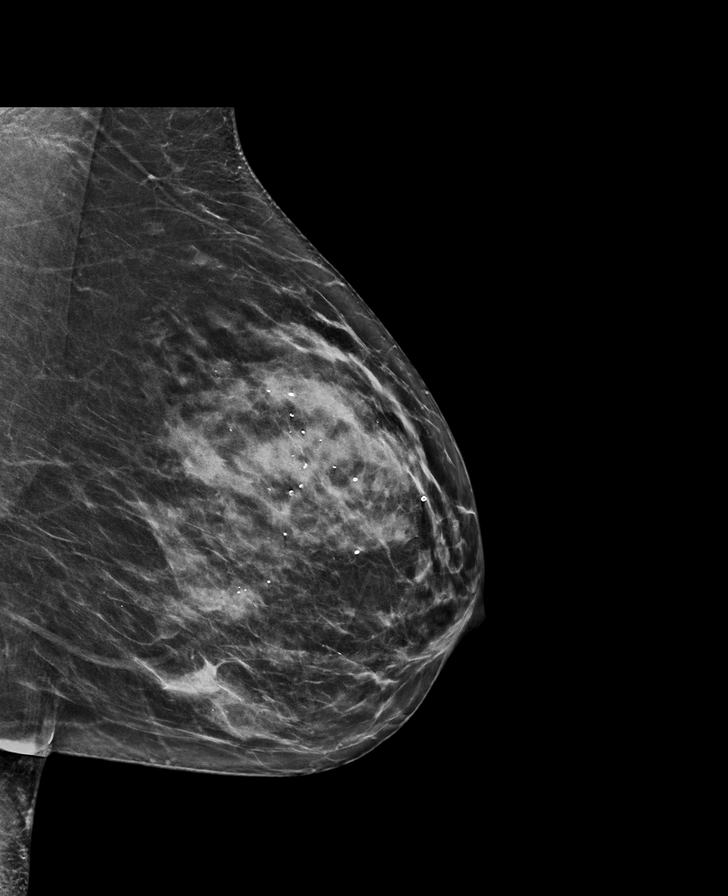

[L CC synth-2D]
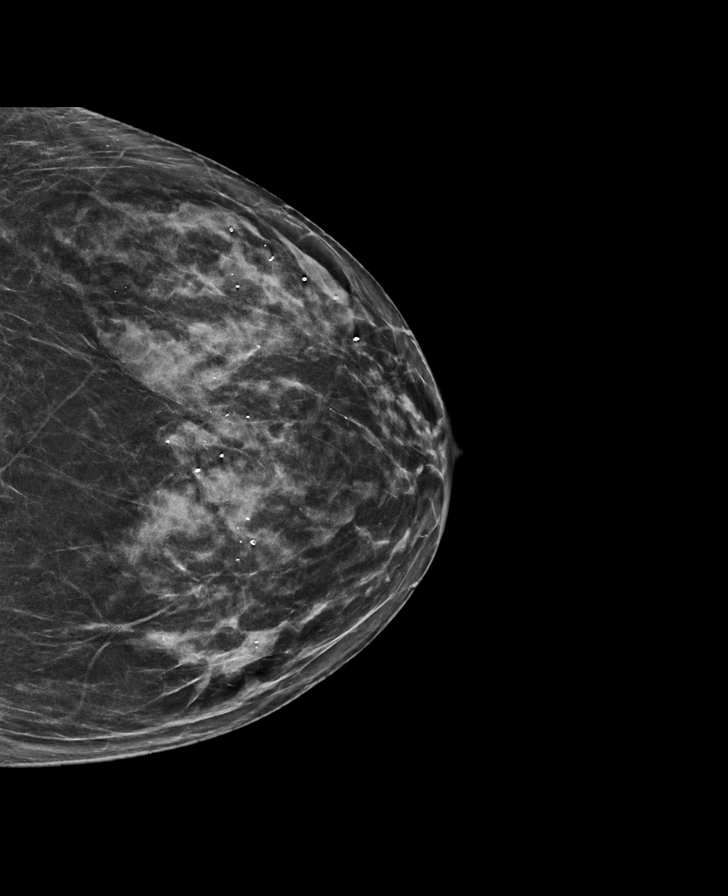

[R MLO synth-2D]
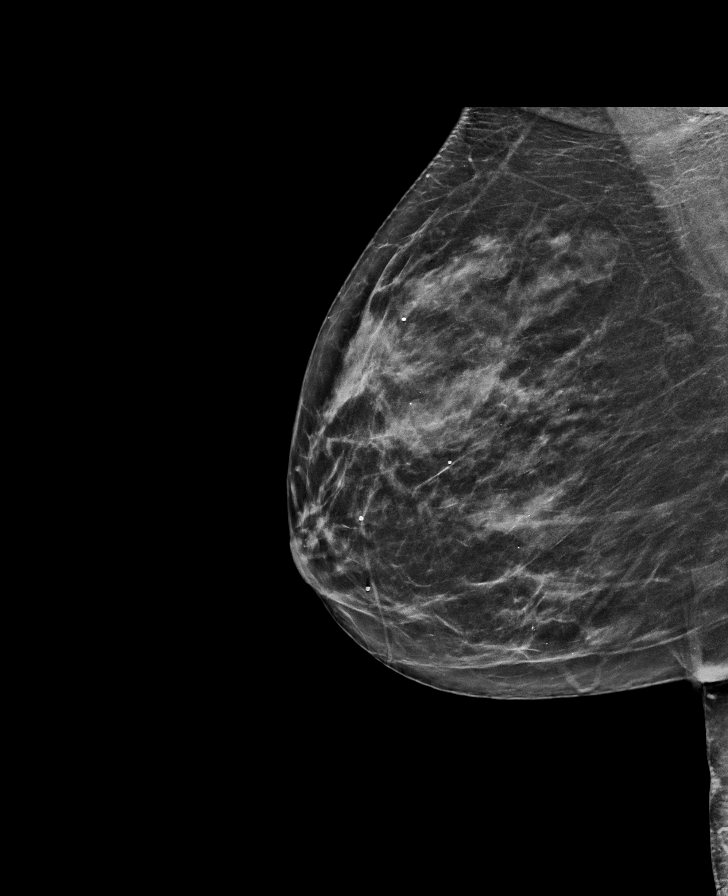

[R MLO tomo · tomo slice 34/67.0]
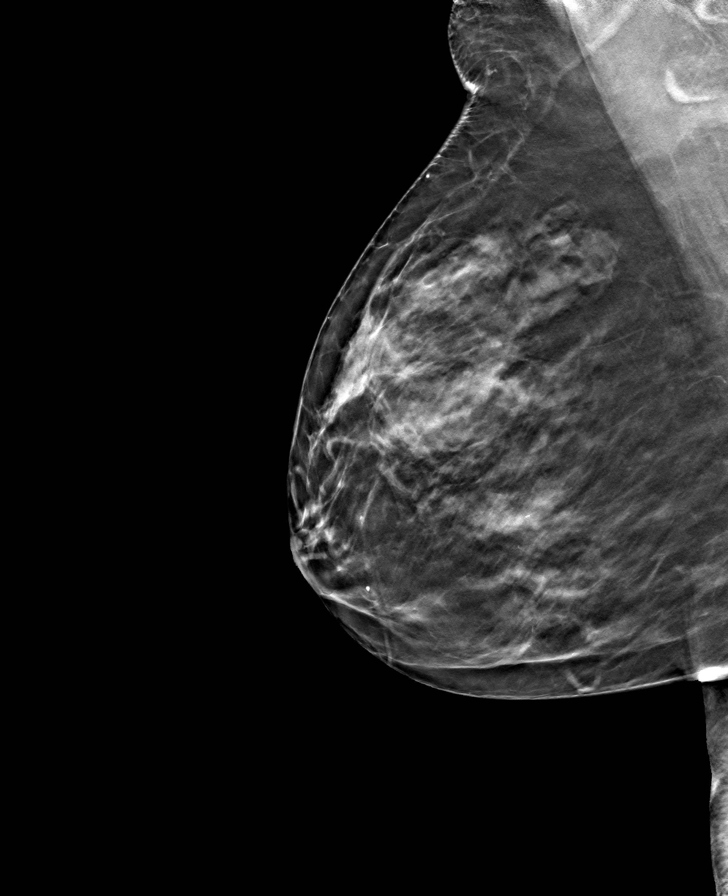

[L MLO tomo · tomo slice 34/67.0]
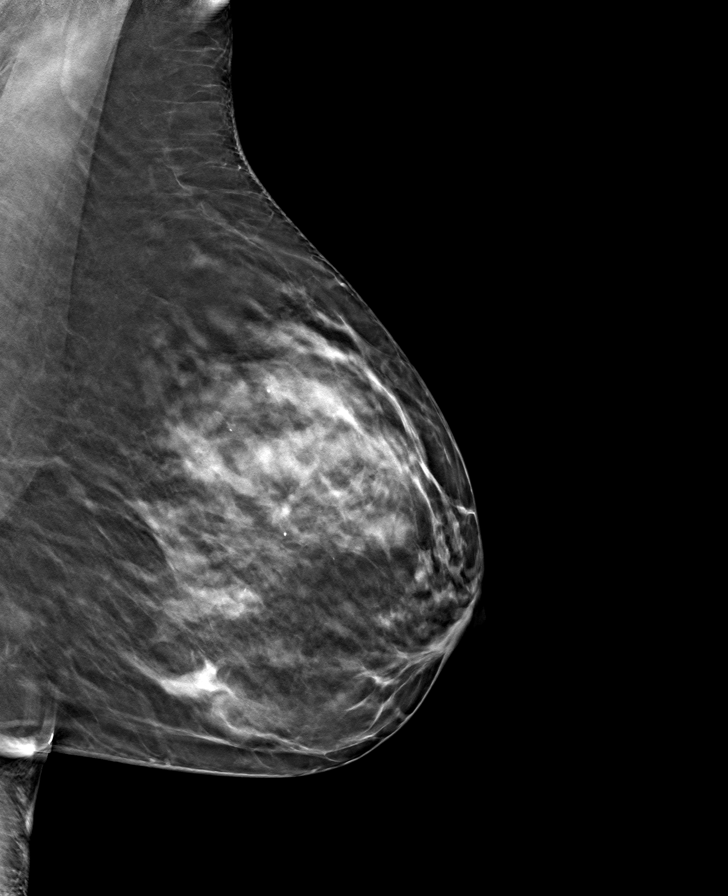

[R CC tomo · tomo slice 32/63.0]
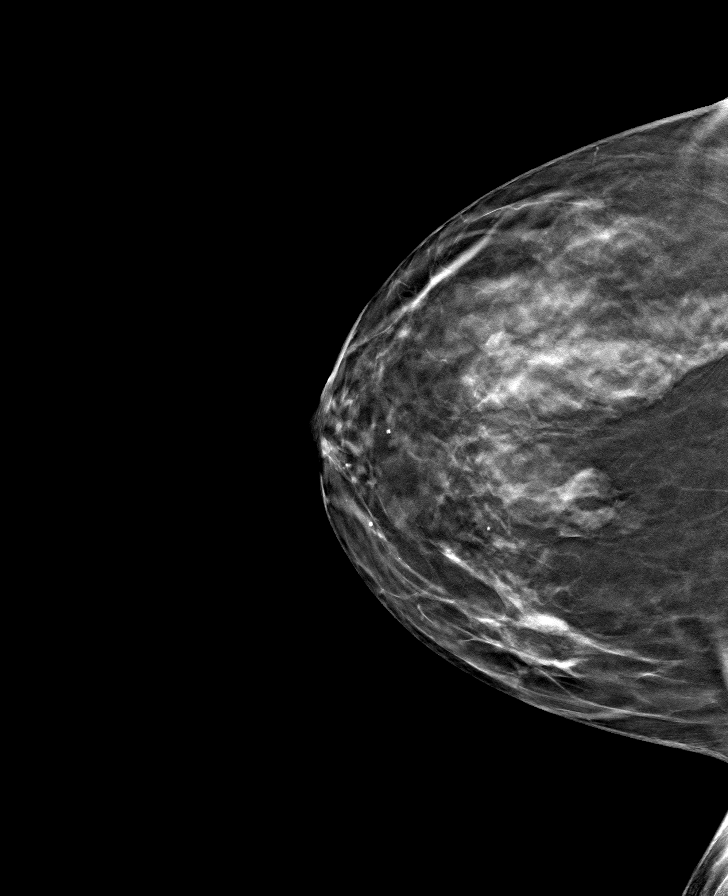

[L CC tomo · tomo slice 31/62.0]
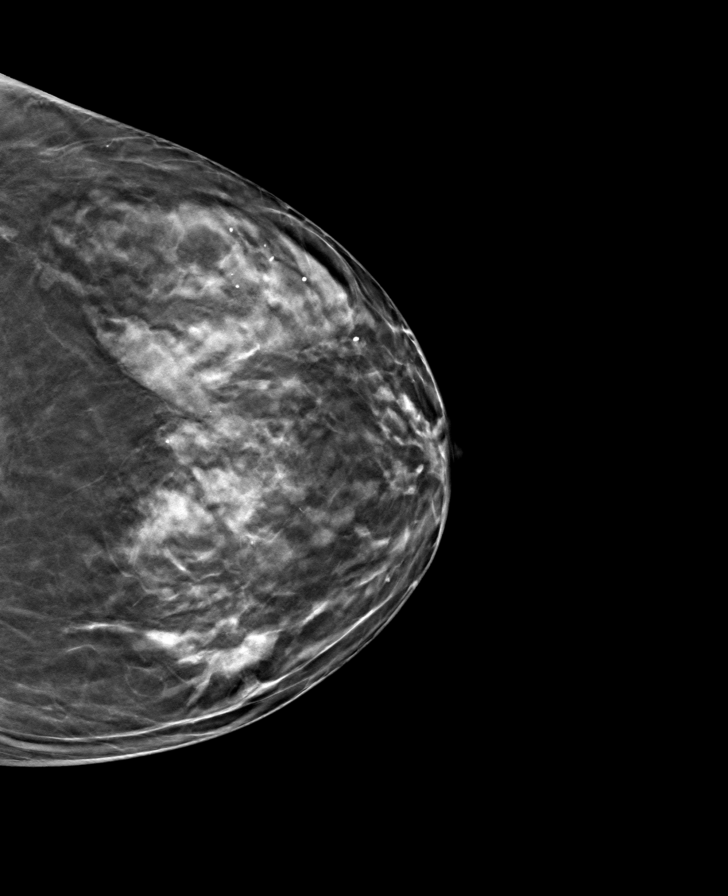

[8 of 24 positions shown; findings below may reference images not displayed]

ACR Breast Density Category c: The breast tissue is heterogeneously
dense, which may obscure small masses.
FINDINGS: There are no findings suspicious for malignancy. Images were
processed with CAD.
IMPRESSION: No mammographic evidence of malignancy. A result letter of this
screening mammogram will be mailed directly to the patient.

RECOMMENDATION:
Screening mammogram in one year. (Code:[id number]-[id number])

BI-RADS CATEGORY  1: Negative.

## 2020-07-11 DIAGNOSIS — G932 Benign intracranial hypertension: Secondary | ICD-10-CM | POA: Diagnosis not present

## 2020-07-11 DIAGNOSIS — E785 Hyperlipidemia, unspecified: Secondary | ICD-10-CM | POA: Diagnosis not present

## 2020-07-11 DIAGNOSIS — N1831 Chronic kidney disease, stage 3a: Secondary | ICD-10-CM | POA: Diagnosis not present

## 2020-07-11 DIAGNOSIS — Z1212 Encounter for screening for malignant neoplasm of rectum: Secondary | ICD-10-CM | POA: Diagnosis not present

## 2020-07-11 DIAGNOSIS — G629 Polyneuropathy, unspecified: Secondary | ICD-10-CM | POA: Diagnosis not present

## 2020-07-11 DIAGNOSIS — E1129 Type 2 diabetes mellitus with other diabetic kidney complication: Secondary | ICD-10-CM | POA: Diagnosis not present

## 2020-07-11 DIAGNOSIS — E1139 Type 2 diabetes mellitus with other diabetic ophthalmic complication: Secondary | ICD-10-CM | POA: Diagnosis not present

## 2020-07-11 DIAGNOSIS — I129 Hypertensive chronic kidney disease with stage 1 through stage 4 chronic kidney disease, or unspecified chronic kidney disease: Secondary | ICD-10-CM | POA: Diagnosis not present

## 2020-07-11 DIAGNOSIS — I700 Atherosclerosis of aorta: Secondary | ICD-10-CM | POA: Diagnosis not present

## 2020-07-11 DIAGNOSIS — I10 Essential (primary) hypertension: Secondary | ICD-10-CM | POA: Diagnosis not present

## 2020-07-11 DIAGNOSIS — R82998 Other abnormal findings in urine: Secondary | ICD-10-CM | POA: Diagnosis not present

## 2020-07-11 DIAGNOSIS — E781 Pure hyperglyceridemia: Secondary | ICD-10-CM | POA: Diagnosis not present

## 2020-07-11 DIAGNOSIS — M8580 Other specified disorders of bone density and structure, unspecified site: Secondary | ICD-10-CM | POA: Diagnosis not present

## 2020-07-11 DIAGNOSIS — H353210 Exudative age-related macular degeneration, right eye, stage unspecified: Secondary | ICD-10-CM | POA: Diagnosis not present

## 2020-07-11 DIAGNOSIS — Z0000 Encounter for general adult medical examination without abnormal findings: Secondary | ICD-10-CM | POA: Diagnosis not present

## 2020-07-13 DIAGNOSIS — E113593 Type 2 diabetes mellitus with proliferative diabetic retinopathy without macular edema, bilateral: Secondary | ICD-10-CM | POA: Diagnosis not present

## 2020-07-13 DIAGNOSIS — H353124 Nonexudative age-related macular degeneration, left eye, advanced atrophic with subfoveal involvement: Secondary | ICD-10-CM | POA: Diagnosis not present

## 2020-07-13 DIAGNOSIS — H35371 Puckering of macula, right eye: Secondary | ICD-10-CM | POA: Diagnosis not present

## 2020-07-13 DIAGNOSIS — H353211 Exudative age-related macular degeneration, right eye, with active choroidal neovascularization: Secondary | ICD-10-CM | POA: Diagnosis not present

## 2020-07-25 ENCOUNTER — Ambulatory Visit (INDEPENDENT_AMBULATORY_CARE_PROVIDER_SITE_OTHER): Payer: Medicare Other | Admitting: Cardiovascular Disease

## 2020-07-25 ENCOUNTER — Other Ambulatory Visit: Payer: Self-pay

## 2020-07-25 ENCOUNTER — Encounter: Payer: Self-pay | Admitting: Cardiovascular Disease

## 2020-07-25 VITALS — BP 124/72 | HR 68 | Ht 64.0 in | Wt 164.8 lb

## 2020-07-25 DIAGNOSIS — I491 Atrial premature depolarization: Secondary | ICD-10-CM | POA: Diagnosis not present

## 2020-07-25 DIAGNOSIS — I340 Nonrheumatic mitral (valve) insufficiency: Secondary | ICD-10-CM | POA: Diagnosis not present

## 2020-07-25 DIAGNOSIS — I10 Essential (primary) hypertension: Secondary | ICD-10-CM

## 2020-07-25 NOTE — Progress Notes (Signed)
Chief Complaint  Patient presents with  . Follow-up    PALPITATIONS      History of Present Illness: 74 yo female with history of SVT, DM, HTN, HLD and palpitations felt to be due to PACs who is here today for follow up. I saw her in 2012 for evaluation of palpitations and leg pain in the setting of steroid use for poison ivy treatment. I arranged a 48 Holter monitor and an echo. Her echo showed normal LV size and function. Her Holter monitor showed occasional premature atrial contractions but no VT or atrial fibrillation. I saw her in 2018 and she had c/o more frequent palpitations. Cardiac monitor April 2018 with PACs and several short runs of SVT. Echo April 2018 with normal LV systolic function and mild mitral regurgitation.  She is here today for follow up. The patient denies any chest pain, dyspnea, palpitations, lower extremity edema, orthopnea, PND, dizziness, near syncope or syncope.   Primary Care Physician: Marton Redwood, MD  Past Medical History:  Diagnosis Date  . Allergy   . Anxiety    no meds   . Cancer ([place])    mole   . Diabetes mellitus without complication ([place])   . Diverticulosis   . Fibrocystic breast disease   . Fundic gland polyps of stomach, benign 03/24/2015  . GERD (gastroesophageal reflux disease)   . Hx of colonic polyps 04/24/2017  . Hyperlipidemia   . Hypertension   . IBS (irritable bowel syndrome)   . Macular degeneration    vitreous hemorrhage  . Obesity   . Papilledema, both eyes 07/28/2018   Dr Lorrene Reid  . Tachycardia, unspecified 04/08/2017   pt. states she saw Dr. Heloise Ochoa for this no meds no procedures, wore monitor  . Urge incontinence     Past Surgical History:  Procedure Laterality Date  . breast lymph node removed     right  . CHOLECYSTECTOMY    . COLONOSCOPY  06/2002, 04/10/2007   diverticulosis, colon polyps, external hemorrhoids  . KNEE ARTHROSCOPY     left  . PARTIAL HYSTERECTOMY    . POLYPECTOMY    . SHOULDER  SURGERY     right  . UPPER GASTROINTESTINAL ENDOSCOPY  09/27/11  . VIDEO BRONCHOSCOPY Bilateral 02/14/2015   Procedure: VIDEO BRONCHOSCOPY WITHOUT FLUORO;  Surgeon: Collene Gobble, MD;  Location: [facility];  Service: Cardiopulmonary;  Laterality: Bilateral;  . WISDOM TOOTH EXTRACTION      Current Outpatient Medications  Medication Sig Dispense Refill  . amLODipine (NORVASC) 5 MG tablet Take 1 tablet by mouth daily.    . bisoprolol (ZEBETA) 10 MG tablet Take 10 mg by mouth daily.    . cholecalciferol (VITAMIN D) 1000 units tablet Take 1,000 Units by mouth daily.    . Coenzyme Q10 (COQ10) 100 MG CAPS Take 1 capsule by mouth daily.    Marland Kitchen esomeprazole (NEXIUM) 20 MG capsule Take 20 mg by mouth 2 (two) times daily before a meal.    . loratadine (CLARITIN) 10 MG tablet Take 10 mg by mouth daily as needed for allergies.     . metFORMIN (GLUCOPHAGE-XR) 500 MG 24 hr tablet Take 500 mg by mouth daily with breakfast.     . Multiple Vitamins-Minerals (PRESERVISION AREDS 2 PO) Take 1 tablet by mouth daily.     . nitrofurantoin (MACRODANTIN) 50 MG capsule Take 50 mg by mouth at bedtime.     Marland Kitchen olmesartan (BENICAR) 40 MG tablet Take 1 tablet by mouth daily.    Marland Kitchen  rosuvastatin (CRESTOR) 10 MG tablet     . vitamin B-12 (CYANOCOBALAMIN) 1000 MCG tablet Take 1,000 mcg by mouth daily.     No current facility-administered medications for this visit.    Allergies  Allergen Reactions  . Janumet Xr [Sitagliptin-Metformin Hcl Er] Swelling  . Tricor [Fenofibrate] Swelling  . Zocor [Simvastatin] Swelling  . Lovaza [Omega-3-Acid Ethyl Esters] Other (See Comments)    GI intolerance  . Sulfa Antibiotics   . Trimethoprim     Rash / hives that are painful  . Sulfur Rash    Social History   Socioeconomic History  . Marital status: Married    Spouse name: Not on file  . Number of children: Not on file  . Years of education: Not on file  . Highest education level: Not on file  Occupational History     Employer: RETIRED  Tobacco Use  . Smoking status: Never Smoker  . Smokeless tobacco: Never Used  Vaping Use  . Vaping Use: Never used  Substance and Sexual Activity  . Alcohol use: No    Alcohol/week: 0.0 standard drinks  . Drug use: No  . Sexual activity: Yes    Birth control/protection: Post-menopausal  Other Topics Concern  . Not on file  Social History Narrative   Has one great Merchandiser, retail, Oceanographer.  Lives home with husband.  Has 2 children.  Caffeine rare.     Enjoys gardening   Social Determinants of Radio broadcast assistant Strain:   . Difficulty of Paying Living Expenses:   Food Insecurity:   . Worried About Charity fundraiser in the Last Year:   . Arboriculturist in the Last Year:   Transportation Needs:   . Film/video editor (Medical):   Marland Kitchen Lack of Transportation (Non-Medical):   Physical Activity:   . Days of Exercise per Week:   . Minutes of Exercise per Session:   Stress:   . Feeling of Stress :   Social Connections:   . Frequency of Communication with Friends and Family:   . Frequency of Social Gatherings with Friends and Family:   . Attends Religious Services:   . Active Member of Clubs or Organizations:   . Attends Archivist Meetings:   Marland Kitchen Marital Status:   Intimate Partner Violence:   . Fear of Current or Ex-Partner:   . Emotionally Abused:   Marland Kitchen Physically Abused:   . Sexually Abused:     Family History  Problem Relation Age of Onset  . Diabetes Father   . Heart failure Father   . Hypertension Mother   . Heart failure Mother   . Diabetes Mother   . Lung cancer Mother   . Stroke Mother   . Leukemia Other        grandfather  . Cirrhosis Other        grandmother  . Hypertension Sister   . Colon cancer Maternal Uncle 22  . Breast cancer Maternal Aunt   . Colon polyps Neg Hx   . Esophageal cancer Neg Hx   . Rectal cancer Neg Hx   . Stomach cancer Neg Hx     Review of Systems:  As stated in  the HPI and otherwise negative.   BP 124/72   Pulse 68   Ht 5\' 4"  (1.626 m)   Wt 164 lb 12.8 oz (74.8 kg)   SpO2 96%   BMI 28.29 kg/m   Physical Examination:  General:  Well developed, well nourished, NAD  HEENT: OP clear, mucus membranes moist  SKIN: warm, dry. No rashes. Neuro: No focal deficits  Musculoskeletal: Muscle strength 5/5 all ext  Psychiatric: Mood and affect normal  Neck: No JVD, no carotid bruits, no thyromegaly, no lymphadenopathy.  Lungs:Clear bilaterally, no wheezes, rhonci, crackles Cardiovascular: Regular rate and rhythm. No murmurs, gallops or rubs. Abdomen:Soft. Bowel sounds present. Non-tender.  Extremities: No lower extremity edema. Pulses are 2 + in the bilateral DP/PT.  Echo April 2018: Left ventricle: The cavity size was normal. Systolic function was   vigorous. The estimated ejection fraction was in the range of 65%   to 70%. Wall motion was normal; there were no regional wall   motion abnormalities. Doppler parameters are consistent with   abnormal left ventricular relaxation (grade 1 diastolic   dysfunction). Doppler parameters are consistent with elevated   ventricular end-diastolic filling pressure. - Aortic valve: There was no regurgitation. - Mitral valve: There was mild regurgitation. - Left atrium: The atrium was normal in size. - Right ventricle: Systolic function was normal. - Right atrium: The atrium was normal in size. - Tricuspid valve: There was trivial regurgitation. - Pericardium, extracardiac: The pericardium was normal in   appearance.  EKG:  EKG is ordered today. The ekg ordered today demonstrates sinus  Recent Labs: No results found for requested labs within last 8760 hours.    Wt Readings from Last 3 Encounters:  07/25/20 164 lb 12.8 oz (74.8 kg)  05/23/20 169 lb (76.7 kg)  05/18/20 169 lb (76.7 kg)    Other studies Reviewed: Additional studies/ records that were reviewed today include: . Review of the above records  demonstrates:   Assessment and Plan:   1. PALPITATIONS/PAC/Paroxysmal SVT: Echo April 2018 with normal LV size and function. She has rare palpitations. Continue beta blocker.       2. HTN: BP is well controlled. Continue current therapy  3. Mitral regurgitation: Mild by echo in 2018. Will repeat echo now.   Current medicines are reviewed at length with the patient today.  The patient does not have concerns regarding medicines.  The following changes have been made:  no change  Labs/ tests ordered today include:   Orders Placed This Encounter  Procedures  . EKG 12-Lead  . ECHOCARDIOGRAM COMPLETE     Disposition:   FU with me in 12  months   Signed, Lauree Chandler, MD 07/25/2020 9:48 AM    [facility] [address], [place], [place]  [address] Phone: [phone]; Fax: [phone]

## 2020-07-25 NOTE — Patient Instructions (Signed)
Medication Instructions:  Your provider recommends that you continue on your current medications as directed. Please refer to the Current Medication list given to you today.   *If you need a refill on your cardiac medications before your next appointment, please call your pharmacy*  Testing/Procedures: Your provider has requested that you have an echocardiogram. Echocardiography is a painless test that uses sound waves to create images of your heart. It provides your doctor with information about the size and shape of your heart and how well your hearts chambers and valves are working. This procedure takes approximately one hour. There are no restrictions for this procedure.     Follow-Up: At [facility], you and your health needs are our priority.  As part of our continuing mission to provide you with exceptional heart care, we have created designated Provider Care Teams.  These Care Teams include your primary Cardiologist (physician) and Advanced Practice Providers (APPs -  Physician Assistants and Nurse Practitioners) who all work together to provide you with the care you need, when you need it. Your next appointment:   12 month(s) The format for your next appointment:   In Person Provider:   You may see Lauree Chandler, MD or one of the following Advanced Practice Providers on your designated Care Team:    Richardson Dopp, PA-C  [place], [place]

## 2020-08-04 ENCOUNTER — Ambulatory Visit (HOSPITAL_COMMUNITY): Payer: Medicare Other | Attending: Cardiology

## 2020-08-04 ENCOUNTER — Other Ambulatory Visit: Payer: Self-pay

## 2020-08-04 DIAGNOSIS — I340 Nonrheumatic mitral (valve) insufficiency: Secondary | ICD-10-CM | POA: Insufficient documentation

## 2020-08-04 LAB — ECHOCARDIOGRAM COMPLETE
Area-P 1/2: 3.53 cm2
S' Lateral: 2.2 cm

## 2020-08-10 DIAGNOSIS — E113593 Type 2 diabetes mellitus with proliferative diabetic retinopathy without macular edema, bilateral: Secondary | ICD-10-CM | POA: Diagnosis not present

## 2020-08-10 DIAGNOSIS — H353124 Nonexudative age-related macular degeneration, left eye, advanced atrophic with subfoveal involvement: Secondary | ICD-10-CM | POA: Diagnosis not present

## 2020-08-10 DIAGNOSIS — H353211 Exudative age-related macular degeneration, right eye, with active choroidal neovascularization: Secondary | ICD-10-CM | POA: Diagnosis not present

## 2020-08-17 DIAGNOSIS — H3530 Unspecified macular degeneration: Secondary | ICD-10-CM | POA: Diagnosis not present

## 2020-08-23 DIAGNOSIS — N760 Acute vaginitis: Secondary | ICD-10-CM | POA: Diagnosis not present

## 2020-08-23 DIAGNOSIS — N3020 Other chronic cystitis without hematuria: Secondary | ICD-10-CM | POA: Diagnosis not present

## 2020-09-07 DIAGNOSIS — H353124 Nonexudative age-related macular degeneration, left eye, advanced atrophic with subfoveal involvement: Secondary | ICD-10-CM | POA: Diagnosis not present

## 2020-09-07 DIAGNOSIS — E113593 Type 2 diabetes mellitus with proliferative diabetic retinopathy without macular edema, bilateral: Secondary | ICD-10-CM | POA: Diagnosis not present

## 2020-09-07 DIAGNOSIS — H353211 Exudative age-related macular degeneration, right eye, with active choroidal neovascularization: Secondary | ICD-10-CM | POA: Diagnosis not present

## 2020-09-12 ENCOUNTER — Ambulatory Visit
Admission: RE | Admit: 2020-09-12 | Discharge: 2020-09-12 | Disposition: A | Discharge status: Home / Self Care | Payer: Medicare Other | Source: Ambulatory Visit | Attending: Internal Medicine | Admitting: Internal Medicine

## 2020-09-12 ENCOUNTER — Other Ambulatory Visit: Payer: Self-pay

## 2020-09-12 DIAGNOSIS — Z1231 Encounter for screening mammogram for malignant neoplasm of breast: Secondary | ICD-10-CM | POA: Diagnosis not present

## 2020-10-05 DIAGNOSIS — H353211 Exudative age-related macular degeneration, right eye, with active choroidal neovascularization: Secondary | ICD-10-CM | POA: Diagnosis not present

## 2020-10-05 DIAGNOSIS — E113593 Type 2 diabetes mellitus with proliferative diabetic retinopathy without macular edema, bilateral: Secondary | ICD-10-CM | POA: Diagnosis not present

## 2020-10-05 DIAGNOSIS — H353124 Nonexudative age-related macular degeneration, left eye, advanced atrophic with subfoveal involvement: Secondary | ICD-10-CM | POA: Diagnosis not present

## 2020-11-02 DIAGNOSIS — I129 Hypertensive chronic kidney disease with stage 1 through stage 4 chronic kidney disease, or unspecified chronic kidney disease: Secondary | ICD-10-CM | POA: Diagnosis not present

## 2020-11-02 DIAGNOSIS — I10 Essential (primary) hypertension: Secondary | ICD-10-CM | POA: Diagnosis not present

## 2020-11-02 DIAGNOSIS — N1831 Chronic kidney disease, stage 3a: Secondary | ICD-10-CM | POA: Diagnosis not present

## 2020-11-02 DIAGNOSIS — E1129 Type 2 diabetes mellitus with other diabetic kidney complication: Secondary | ICD-10-CM | POA: Diagnosis not present

## 2020-11-11 DIAGNOSIS — H353124 Nonexudative age-related macular degeneration, left eye, advanced atrophic with subfoveal involvement: Secondary | ICD-10-CM | POA: Diagnosis not present

## 2020-11-11 DIAGNOSIS — H353211 Exudative age-related macular degeneration, right eye, with active choroidal neovascularization: Secondary | ICD-10-CM | POA: Diagnosis not present

## 2020-11-11 DIAGNOSIS — E113593 Type 2 diabetes mellitus with proliferative diabetic retinopathy without macular edema, bilateral: Secondary | ICD-10-CM | POA: Diagnosis not present

## 2020-12-08 DIAGNOSIS — Z23 Encounter for immunization: Secondary | ICD-10-CM | POA: Diagnosis not present

## 2020-12-15 DIAGNOSIS — E781 Pure hyperglyceridemia: Secondary | ICD-10-CM | POA: Diagnosis not present

## 2020-12-15 DIAGNOSIS — N1831 Chronic kidney disease, stage 3a: Secondary | ICD-10-CM | POA: Diagnosis not present

## 2020-12-15 DIAGNOSIS — E1129 Type 2 diabetes mellitus with other diabetic kidney complication: Secondary | ICD-10-CM | POA: Diagnosis not present

## 2020-12-15 DIAGNOSIS — I129 Hypertensive chronic kidney disease with stage 1 through stage 4 chronic kidney disease, or unspecified chronic kidney disease: Secondary | ICD-10-CM | POA: Diagnosis not present

## 2020-12-15 DIAGNOSIS — I10 Essential (primary) hypertension: Secondary | ICD-10-CM | POA: Diagnosis not present

## 2021-01-11 DIAGNOSIS — M8580 Other specified disorders of bone density and structure, unspecified site: Secondary | ICD-10-CM | POA: Diagnosis not present

## 2021-01-11 DIAGNOSIS — N1831 Chronic kidney disease, stage 3a: Secondary | ICD-10-CM | POA: Diagnosis not present

## 2021-01-11 DIAGNOSIS — E785 Hyperlipidemia, unspecified: Secondary | ICD-10-CM | POA: Diagnosis not present

## 2021-01-11 DIAGNOSIS — E781 Pure hyperglyceridemia: Secondary | ICD-10-CM | POA: Diagnosis not present

## 2021-01-11 DIAGNOSIS — G932 Benign intracranial hypertension: Secondary | ICD-10-CM | POA: Diagnosis not present

## 2021-01-11 DIAGNOSIS — H353210 Exudative age-related macular degeneration, right eye, stage unspecified: Secondary | ICD-10-CM | POA: Diagnosis not present

## 2021-01-11 DIAGNOSIS — G629 Polyneuropathy, unspecified: Secondary | ICD-10-CM | POA: Diagnosis not present

## 2021-01-11 DIAGNOSIS — I129 Hypertensive chronic kidney disease with stage 1 through stage 4 chronic kidney disease, or unspecified chronic kidney disease: Secondary | ICD-10-CM | POA: Diagnosis not present

## 2021-01-11 DIAGNOSIS — I700 Atherosclerosis of aorta: Secondary | ICD-10-CM | POA: Diagnosis not present

## 2021-01-11 DIAGNOSIS — E1139 Type 2 diabetes mellitus with other diabetic ophthalmic complication: Secondary | ICD-10-CM | POA: Diagnosis not present

## 2021-01-11 DIAGNOSIS — E1129 Type 2 diabetes mellitus with other diabetic kidney complication: Secondary | ICD-10-CM | POA: Diagnosis not present

## 2021-01-13 DIAGNOSIS — E113593 Type 2 diabetes mellitus with proliferative diabetic retinopathy without macular edema, bilateral: Secondary | ICD-10-CM | POA: Diagnosis not present

## 2021-01-13 DIAGNOSIS — H353124 Nonexudative age-related macular degeneration, left eye, advanced atrophic with subfoveal involvement: Secondary | ICD-10-CM | POA: Diagnosis not present

## 2021-01-13 DIAGNOSIS — H353211 Exudative age-related macular degeneration, right eye, with active choroidal neovascularization: Secondary | ICD-10-CM | POA: Diagnosis not present

## 2021-02-14 DIAGNOSIS — H3581 Retinal edema: Secondary | ICD-10-CM | POA: Diagnosis not present

## 2021-02-14 DIAGNOSIS — H353211 Exudative age-related macular degeneration, right eye, with active choroidal neovascularization: Secondary | ICD-10-CM | POA: Diagnosis not present

## 2021-02-14 DIAGNOSIS — H353124 Nonexudative age-related macular degeneration, left eye, advanced atrophic with subfoveal involvement: Secondary | ICD-10-CM | POA: Diagnosis not present

## 2021-02-14 DIAGNOSIS — E113593 Type 2 diabetes mellitus with proliferative diabetic retinopathy without macular edema, bilateral: Secondary | ICD-10-CM | POA: Diagnosis not present

## 2021-03-21 DIAGNOSIS — H353124 Nonexudative age-related macular degeneration, left eye, advanced atrophic with subfoveal involvement: Secondary | ICD-10-CM | POA: Diagnosis not present

## 2021-03-21 DIAGNOSIS — H353211 Exudative age-related macular degeneration, right eye, with active choroidal neovascularization: Secondary | ICD-10-CM | POA: Diagnosis not present

## 2021-03-21 DIAGNOSIS — E113593 Type 2 diabetes mellitus with proliferative diabetic retinopathy without macular edema, bilateral: Secondary | ICD-10-CM | POA: Diagnosis not present

## 2021-03-23 ENCOUNTER — Other Ambulatory Visit: Payer: Self-pay

## 2021-03-23 ENCOUNTER — Ambulatory Visit (INDEPENDENT_AMBULATORY_CARE_PROVIDER_SITE_OTHER): Payer: Medicare Other | Admitting: Podiatry

## 2021-03-23 ENCOUNTER — Encounter: Payer: Self-pay | Admitting: Podiatry

## 2021-03-23 DIAGNOSIS — E119 Type 2 diabetes mellitus without complications: Secondary | ICD-10-CM | POA: Diagnosis not present

## 2021-03-23 NOTE — Progress Notes (Signed)
Subjective: Frances Brown presents today referred by Ginger Organ., MD for diabetic foot evaluation.  Patient relates greater than 3 year history of diabetes.  Patient denies any history of foot wounds.  Patient denies any history of numbness, tingling, burning, pins/needles sensations.  Past Medical History:  Diagnosis Date  . Allergy   . Anxiety    no meds   . Cancer ([place])    mole   . Diabetes mellitus without complication ([place])   . Diverticulosis   . Fibrocystic breast disease   . Fundic gland polyps of stomach, benign 03/24/2015  . GERD (gastroesophageal reflux disease)   . Hx of colonic polyps 04/24/2017  . Hyperlipidemia   . Hypertension   . IBS (irritable bowel syndrome)   . Macular degeneration    vitreous hemorrhage  . Obesity   . Papilledema, both eyes 07/28/2018   Dr Lorrene Reid  . Tachycardia, unspecified 04/08/2017   pt. states she saw Dr. Heloise Ochoa for this no meds no procedures, wore monitor  . Urge incontinence     Patient Active Problem List   Diagnosis Date Noted  . Pain in right leg 07/15/2019  . Gastroesophageal reflux disease 07/14/2018  . Abdominal pain, left lower quadrant 07/09/2017  . Hoarseness 03/11/2015  . Cough 01/28/2014  . HTN (hypertension) 12/27/2011  . Hyperlipidemia 12/27/2011  . Obesity 08/16/2011  . IBS (irritable bowel syndrome) 05/23/2011  . PALPITATIONS 12/21/2010  . Chronic left upper quadrant pain 08/08/2010    Past Surgical History:  Procedure Laterality Date  . BREAST EXCISIONAL BIOPSY Left    over 15 years ( she thinks it may have been a cysts removal but there is a scar  . breast lymph node removed     right  . CHOLECYSTECTOMY    . COLONOSCOPY  06/2002, 04/10/2007   diverticulosis, colon polyps, external hemorrhoids  . KNEE ARTHROSCOPY     left  . PARTIAL HYSTERECTOMY    . POLYPECTOMY    . SHOULDER SURGERY     right  . UPPER GASTROINTESTINAL ENDOSCOPY  09/27/11  . VIDEO BRONCHOSCOPY Bilateral  02/14/2015   Procedure: VIDEO BRONCHOSCOPY WITHOUT FLUORO;  Surgeon: Collene Gobble, MD;  Location: [facility];  Service: Cardiopulmonary;  Laterality: Bilateral;  . WISDOM TOOTH EXTRACTION      Current Outpatient Medications on File Prior to Visit  Medication Sig Dispense Refill  . amLODipine (NORVASC) 5 MG tablet Take 1 tablet by mouth daily.    . bisoprolol (ZEBETA) 10 MG tablet Take 10 mg by mouth daily.    . cholecalciferol (VITAMIN D) 1000 units tablet Take 1,000 Units by mouth daily.    . Coenzyme Q10 (COQ10) 100 MG CAPS Take 1 capsule by mouth daily.    Marland Kitchen esomeprazole (NEXIUM) 20 MG capsule Take 20 mg by mouth 2 (two) times daily before a meal.    . loratadine (CLARITIN) 10 MG tablet Take 10 mg by mouth daily as needed for allergies.     . metFORMIN (GLUCOPHAGE-XR) 500 MG 24 hr tablet Take 500 mg by mouth daily with breakfast.     . Multiple Vitamins-Minerals (PRESERVISION AREDS 2 PO) Take 1 tablet by mouth daily.     . nitrofurantoin (MACRODANTIN) 50 MG capsule Take 50 mg by mouth at bedtime.     Marland Kitchen olmesartan (BENICAR) 40 MG tablet Take 1 tablet by mouth daily.    . rosuvastatin (CRESTOR) 10 MG tablet     . vitamin B-12 (CYANOCOBALAMIN) 1000 MCG tablet Take  1,000 mcg by mouth daily.     No current facility-administered medications on file prior to visit.     Allergies  Allergen Reactions  . Janumet Xr [Sitagliptin-Metformin Hcl Er] Swelling  . Tricor [Fenofibrate] Swelling  . Zocor [Simvastatin] Swelling  . Lovaza [Omega-3-Acid Ethyl Esters] Other (See Comments)    GI intolerance  . Sulfa Antibiotics   . Trimethoprim     Rash / hives that are painful  . Elemental Sulfur Rash    Social History   Occupational History    Employer: RETIRED  Tobacco Use  . Smoking status: Never Smoker  . Smokeless tobacco: Never Used  Vaping Use  . Vaping Use: Never used  Substance and Sexual Activity  . Alcohol use: No    Alcohol/week: 0.0 standard drinks  . Drug use: No  .  Sexual activity: Yes    Birth control/protection: Post-menopausal    Family History  Problem Relation Age of Onset  . Diabetes Father   . Heart failure Father   . Hypertension Mother   . Heart failure Mother   . Diabetes Mother   . Lung cancer Mother   . Stroke Mother   . Leukemia Other        grandfather  . Cirrhosis Other        grandmother  . Hypertension Sister   . Colon cancer Maternal Uncle 63  . Breast cancer Maternal Aunt   . Colon polyps Neg Hx   . Esophageal cancer Neg Hx   . Rectal cancer Neg Hx   . Stomach cancer Neg Hx     Immunization History  Administered Date(s) Administered  . Influenza Split 09/09/2013  . Influenza,inj,Quad PF,6+ Mos 10/01/2014    Review of systems: Positive Findings in bold print.  Constitutional:  chills, fatigue, fever, sweats, weight change Communication: Optometrist, sign Ecologist, hand writing, iPad/Android device Head: headaches, head injury Eyes: changes in vision, eye pain, glaucoma, cataracts, macular degeneration, diplopia, glare,  light sensitivity, eyeglasses or contacts, blindness Ears nose mouth throat: hearing impaired, hearing aids,  ringing in ears, deaf, sign language,  vertigo, nosebleeds,  rhinitis,  cold sores, snoring, swollen glands Cardiovascular: HTN, edema, arrhythmia, pacemaker in place, defibrillator in place, chest pain/tightness, chronic anticoagulation, blood clot, heart failure, MI Peripheral Vascular: leg cramps, varicose veins, blood clots, lymphedema, varicosities Respiratory:  asthma, difficulty breathing, denies congestion, SOB, wheezing, cough, emphysema Gastrointestinal: change in appetite or weight, abdominal pain, constipation, diarrhea, nausea, vomiting, vomiting blood, change in bowel habits, abdominal pain, jaundice, rectal bleeding, hemorrhoids, GERD Genitourinary:  nocturia,  pain on urination, polyuria,  blood in urine, Foley catheter, urinary urgency, ESRD on  hemodialysis Musculoskeletal: amputation, cramping, stiff joints, painful joints, decreased joint motion, fractures, OA, gout, hemiplegia, paraplegia, uses cane, wheelchair bound, uses walker, uses rollator Skin: +changes in toenails, color change, dryness, itching, mole changes,  rash, wound(s) Neurological: headaches, numbness in feet, paresthesias in feet, burning in feet, fainting,  seizures, change in speech, migraines, memory problems/poor historian, cerebral palsy, weakness, paralysis, CVA, TIA Endocrine: diabetes, hypothyroidism, hyperthyroidism,  goiter, dry mouth, flushing, heat intolerance, cold intolerance,  excessive thirst, denies polyuria,  nocturia Hematological:  easy bleeding, excessive bleeding, easy bruising, enlarged lymph nodes, on long term blood thinner, history of past transusions Allergy/immunological:  hives, eczema, frequent infections, multiple drug allergies, seasonal allergies, transplant recipient, multiple food allergies Psychiatric:  anxiety, depression, mood disorder, suicidal ideations, hallucinations, insomnia  Objective: There were no vitals filed for this visit. Vascular Examination: Capillary refill time  less than 3 seconds x 10 digits.  Dorsalis pedis pulses palpable.  Posterior tibial pulses palpable.  Digital hair not present x 10 digits.  Skin temperature gradient WNL b/l.  Dermatological Examination: Skin with normal turgor, texture and tone b/l  Toenails 1-5 b/l discolored, thick, dystrophic with subungual debris and pain with palpation to nailbeds due to thickness of nails.  Musculoskeletal: Muscle strength 5/5 to all LE muscle groups.  Neurological: Sensation intact with 10 gram monofilament.  Vibratory sensation intact.  Assessment: 1. NIDDM 2. Encounter for diabetic foot examination  Plan: 1. Discussed diabetic foot care principles. Literature dispensed on today. 2. Patient to continue soft, supportive shoe gear  daily. 3. Patient to report any pedal injuries to medical professional immediately. 4. Follow up one year. 5. Patient/POA to call should there be a concern in the interim.

## 2021-04-19 DIAGNOSIS — I10 Essential (primary) hypertension: Secondary | ICD-10-CM | POA: Diagnosis not present

## 2021-04-19 DIAGNOSIS — I129 Hypertensive chronic kidney disease with stage 1 through stage 4 chronic kidney disease, or unspecified chronic kidney disease: Secondary | ICD-10-CM | POA: Diagnosis not present

## 2021-04-19 DIAGNOSIS — E1129 Type 2 diabetes mellitus with other diabetic kidney complication: Secondary | ICD-10-CM | POA: Diagnosis not present

## 2021-04-19 DIAGNOSIS — N1831 Chronic kidney disease, stage 3a: Secondary | ICD-10-CM | POA: Diagnosis not present

## 2021-04-19 DIAGNOSIS — E785 Hyperlipidemia, unspecified: Secondary | ICD-10-CM | POA: Diagnosis not present

## 2021-04-21 DIAGNOSIS — H838X3 Other specified diseases of inner ear, bilateral: Secondary | ICD-10-CM | POA: Diagnosis not present

## 2021-04-21 DIAGNOSIS — H903 Sensorineural hearing loss, bilateral: Secondary | ICD-10-CM | POA: Diagnosis not present

## 2021-04-21 DIAGNOSIS — H9313 Tinnitus, bilateral: Secondary | ICD-10-CM | POA: Diagnosis not present

## 2021-04-21 DIAGNOSIS — H6121 Impacted cerumen, right ear: Secondary | ICD-10-CM | POA: Diagnosis not present

## 2021-04-26 DIAGNOSIS — E113593 Type 2 diabetes mellitus with proliferative diabetic retinopathy without macular edema, bilateral: Secondary | ICD-10-CM | POA: Diagnosis not present

## 2021-04-26 DIAGNOSIS — H353211 Exudative age-related macular degeneration, right eye, with active choroidal neovascularization: Secondary | ICD-10-CM | POA: Diagnosis not present

## 2021-04-26 DIAGNOSIS — H353134 Nonexudative age-related macular degeneration, bilateral, advanced atrophic with subfoveal involvement: Secondary | ICD-10-CM | POA: Diagnosis not present

## 2021-05-31 DIAGNOSIS — H353124 Nonexudative age-related macular degeneration, left eye, advanced atrophic with subfoveal involvement: Secondary | ICD-10-CM | POA: Diagnosis not present

## 2021-05-31 DIAGNOSIS — E113593 Type 2 diabetes mellitus with proliferative diabetic retinopathy without macular edema, bilateral: Secondary | ICD-10-CM | POA: Diagnosis not present

## 2021-05-31 DIAGNOSIS — H353211 Exudative age-related macular degeneration, right eye, with active choroidal neovascularization: Secondary | ICD-10-CM | POA: Diagnosis not present

## 2021-06-14 ENCOUNTER — Other Ambulatory Visit: Payer: Self-pay

## 2021-06-14 ENCOUNTER — Ambulatory Visit (INDEPENDENT_AMBULATORY_CARE_PROVIDER_SITE_OTHER): Payer: Medicare Other | Admitting: Podiatry

## 2021-06-14 DIAGNOSIS — B351 Tinea unguium: Secondary | ICD-10-CM | POA: Diagnosis not present

## 2021-06-14 DIAGNOSIS — M79675 Pain in left toe(s): Secondary | ICD-10-CM | POA: Diagnosis not present

## 2021-06-14 DIAGNOSIS — M79674 Pain in right toe(s): Secondary | ICD-10-CM | POA: Diagnosis not present

## 2021-06-15 ENCOUNTER — Encounter: Payer: Self-pay | Admitting: Podiatry

## 2021-06-15 NOTE — Progress Notes (Signed)
  Subjective:  Patient ID: Frances Brown, female    DOB: 07/15/1946,  MRN: [id number]  Chief Complaint  Patient presents with   debride    DFC -FBS: 170 a!C: 6.7 PCP: [facility] x 2 mo   75 y.o. female returns for the above complaint.  Patient presents with thickened elongated dystrophic toenails x10.  Patient's last A1c was 6.7.  She would like to have them debrided out as she is not able to do it herself.  She denies any other acute complaints.  Objective:  There were no vitals filed for this visit. Podiatric Exam: Vascular: dorsalis pedis and posterior tibial pulses are palpable bilateral. Capillary return is immediate. Temperature gradient is WNL. Skin turgor WNL  Sensorium: Normal Semmes Weinstein monofilament test. Normal tactile sensation bilaterally. Nail Exam: Pt has thick disfigured discolored nails with subungual debris noted bilateral entire nail hallux through fifth toenails.  Pain on palpation to the nails. Ulcer Exam: There is no evidence of ulcer or pre-ulcerative changes or infection. Orthopedic Exam: Muscle tone and strength are WNL. No limitations in general ROM. No crepitus or effusions noted. HAV  B/L.  Hammer toes 2-5  B/L. Skin: No Porokeratosis. No infection or ulcers    Assessment & Plan:   1. Pain due to onychomycosis of toenails of both feet     Patient was evaluated and treated and all questions answered.  Onychomycosis with pain  -Nails palliatively debrided as below. -Educated on self-care  Procedure: Nail Debridement Rationale: pain  Type of Debridement: manual, sharp debridement. Instrumentation: Nail nipper, rotary burr. Number of Nails: 10  Procedures and Treatment: Consent by patient was obtained for treatment procedures. The patient understood the discussion of treatment and procedures well. All questions were answered thoroughly reviewed. Debridement of mycotic and hypertrophic toenails, 1 through 5 bilateral and clearing of subungual debris. No  ulceration, no infection noted.  Return Visit-Office Procedure: Patient instructed to return to the office for a follow up visit 3 months for continued evaluation and treatment.  Boneta Lucks, DPM    No follow-ups on file.

## 2021-06-27 DIAGNOSIS — L820 Inflamed seborrheic keratosis: Secondary | ICD-10-CM | POA: Diagnosis not present

## 2021-06-27 DIAGNOSIS — D225 Melanocytic nevi of trunk: Secondary | ICD-10-CM | POA: Diagnosis not present

## 2021-06-27 DIAGNOSIS — L814 Other melanin hyperpigmentation: Secondary | ICD-10-CM | POA: Diagnosis not present

## 2021-06-27 DIAGNOSIS — L308 Other specified dermatitis: Secondary | ICD-10-CM | POA: Diagnosis not present

## 2021-06-27 DIAGNOSIS — L821 Other seborrheic keratosis: Secondary | ICD-10-CM | POA: Diagnosis not present

## 2021-07-05 DIAGNOSIS — H353211 Exudative age-related macular degeneration, right eye, with active choroidal neovascularization: Secondary | ICD-10-CM | POA: Diagnosis not present

## 2021-07-05 DIAGNOSIS — H353124 Nonexudative age-related macular degeneration, left eye, advanced atrophic with subfoveal involvement: Secondary | ICD-10-CM | POA: Diagnosis not present

## 2021-07-05 DIAGNOSIS — E113593 Type 2 diabetes mellitus with proliferative diabetic retinopathy without macular edema, bilateral: Secondary | ICD-10-CM | POA: Diagnosis not present

## 2021-07-10 DIAGNOSIS — R42 Dizziness and giddiness: Secondary | ICD-10-CM | POA: Diagnosis not present

## 2021-07-10 DIAGNOSIS — I129 Hypertensive chronic kidney disease with stage 1 through stage 4 chronic kidney disease, or unspecified chronic kidney disease: Secondary | ICD-10-CM | POA: Diagnosis not present

## 2021-07-10 DIAGNOSIS — E1129 Type 2 diabetes mellitus with other diabetic kidney complication: Secondary | ICD-10-CM | POA: Diagnosis not present

## 2021-07-10 DIAGNOSIS — N1831 Chronic kidney disease, stage 3a: Secondary | ICD-10-CM | POA: Diagnosis not present

## 2021-07-24 DIAGNOSIS — E785 Hyperlipidemia, unspecified: Secondary | ICD-10-CM | POA: Diagnosis not present

## 2021-07-24 DIAGNOSIS — M859 Disorder of bone density and structure, unspecified: Secondary | ICD-10-CM | POA: Diagnosis not present

## 2021-07-24 DIAGNOSIS — E1129 Type 2 diabetes mellitus with other diabetic kidney complication: Secondary | ICD-10-CM | POA: Diagnosis not present

## 2021-07-31 DIAGNOSIS — N1832 Chronic kidney disease, stage 3b: Secondary | ICD-10-CM | POA: Diagnosis not present

## 2021-07-31 DIAGNOSIS — R82998 Other abnormal findings in urine: Secondary | ICD-10-CM | POA: Diagnosis not present

## 2021-07-31 DIAGNOSIS — I129 Hypertensive chronic kidney disease with stage 1 through stage 4 chronic kidney disease, or unspecified chronic kidney disease: Secondary | ICD-10-CM | POA: Diagnosis not present

## 2021-07-31 DIAGNOSIS — I700 Atherosclerosis of aorta: Secondary | ICD-10-CM | POA: Diagnosis not present

## 2021-07-31 DIAGNOSIS — E785 Hyperlipidemia, unspecified: Secondary | ICD-10-CM | POA: Diagnosis not present

## 2021-07-31 DIAGNOSIS — E1139 Type 2 diabetes mellitus with other diabetic ophthalmic complication: Secondary | ICD-10-CM | POA: Diagnosis not present

## 2021-07-31 DIAGNOSIS — E781 Pure hyperglyceridemia: Secondary | ICD-10-CM | POA: Diagnosis not present

## 2021-07-31 DIAGNOSIS — E1129 Type 2 diabetes mellitus with other diabetic kidney complication: Secondary | ICD-10-CM | POA: Diagnosis not present

## 2021-07-31 DIAGNOSIS — Z1331 Encounter for screening for depression: Secondary | ICD-10-CM | POA: Diagnosis not present

## 2021-07-31 DIAGNOSIS — G932 Benign intracranial hypertension: Secondary | ICD-10-CM | POA: Diagnosis not present

## 2021-07-31 DIAGNOSIS — M8580 Other specified disorders of bone density and structure, unspecified site: Secondary | ICD-10-CM | POA: Diagnosis not present

## 2021-07-31 DIAGNOSIS — I10 Essential (primary) hypertension: Secondary | ICD-10-CM | POA: Diagnosis not present

## 2021-07-31 DIAGNOSIS — Z1339 Encounter for screening examination for other mental health and behavioral disorders: Secondary | ICD-10-CM | POA: Diagnosis not present

## 2021-07-31 DIAGNOSIS — Z0000 Encounter for general adult medical examination without abnormal findings: Secondary | ICD-10-CM | POA: Diagnosis not present

## 2021-08-09 DIAGNOSIS — E113593 Type 2 diabetes mellitus with proliferative diabetic retinopathy without macular edema, bilateral: Secondary | ICD-10-CM | POA: Diagnosis not present

## 2021-08-09 DIAGNOSIS — H353124 Nonexudative age-related macular degeneration, left eye, advanced atrophic with subfoveal involvement: Secondary | ICD-10-CM | POA: Diagnosis not present

## 2021-08-09 DIAGNOSIS — H353211 Exudative age-related macular degeneration, right eye, with active choroidal neovascularization: Secondary | ICD-10-CM | POA: Diagnosis not present

## 2021-08-09 DIAGNOSIS — H31093 Other chorioretinal scars, bilateral: Secondary | ICD-10-CM | POA: Diagnosis not present

## 2021-08-30 DIAGNOSIS — N3020 Other chronic cystitis without hematuria: Secondary | ICD-10-CM | POA: Diagnosis not present

## 2021-08-30 DIAGNOSIS — M545 Low back pain, unspecified: Secondary | ICD-10-CM | POA: Diagnosis not present

## 2021-08-30 DIAGNOSIS — N3941 Urge incontinence: Secondary | ICD-10-CM | POA: Diagnosis not present

## 2021-09-01 DIAGNOSIS — M8589 Other specified disorders of bone density and structure, multiple sites: Secondary | ICD-10-CM | POA: Diagnosis not present

## 2021-09-12 DIAGNOSIS — N1832 Chronic kidney disease, stage 3b: Secondary | ICD-10-CM | POA: Diagnosis not present

## 2021-09-12 DIAGNOSIS — E1129 Type 2 diabetes mellitus with other diabetic kidney complication: Secondary | ICD-10-CM | POA: Diagnosis not present

## 2021-09-12 DIAGNOSIS — I129 Hypertensive chronic kidney disease with stage 1 through stage 4 chronic kidney disease, or unspecified chronic kidney disease: Secondary | ICD-10-CM | POA: Diagnosis not present

## 2021-09-12 DIAGNOSIS — E785 Hyperlipidemia, unspecified: Secondary | ICD-10-CM | POA: Diagnosis not present

## 2021-09-13 ENCOUNTER — Ambulatory Visit: Payer: Medicare Other | Admitting: Podiatry

## 2021-09-13 DIAGNOSIS — H353124 Nonexudative age-related macular degeneration, left eye, advanced atrophic with subfoveal involvement: Secondary | ICD-10-CM | POA: Diagnosis not present

## 2021-09-13 DIAGNOSIS — H353211 Exudative age-related macular degeneration, right eye, with active choroidal neovascularization: Secondary | ICD-10-CM | POA: Diagnosis not present

## 2021-09-13 DIAGNOSIS — E113593 Type 2 diabetes mellitus with proliferative diabetic retinopathy without macular edema, bilateral: Secondary | ICD-10-CM | POA: Diagnosis not present

## 2021-09-19 ENCOUNTER — Other Ambulatory Visit: Payer: Self-pay

## 2021-09-19 ENCOUNTER — Ambulatory Visit (INDEPENDENT_AMBULATORY_CARE_PROVIDER_SITE_OTHER): Payer: Medicare Other | Admitting: Orthopaedic Surgery

## 2021-09-19 ENCOUNTER — Ambulatory Visit: Payer: Self-pay

## 2021-09-19 ENCOUNTER — Encounter: Payer: Self-pay | Admitting: Orthopaedic Surgery

## 2021-09-19 DIAGNOSIS — M25552 Pain in left hip: Secondary | ICD-10-CM

## 2021-09-19 NOTE — Progress Notes (Signed)
Office Visit Note   Patient: Frances Brown           Date of Birth: [birth year]           MRN: [id number] Visit Date: 09/19/2021              Requested by: Ginger Organ., MD [address] [place] [address] [place]   [id number] PCP: Ginger Organ., MD   Assessment & Plan: Visit Diagnoses:  1. Pain in left hip     Plan: Frances Brown relates that she recently has been experiencing some pain in her left groin and anterior thigh and lateral hip.  I saw her about 2 years ago for evaluation of back and hip pain and thought the problem might originate from her lumbar spine.  She had some areas of degeneration but she is not having any specific spine pain nor radicular discomfort.  X-rays today demonstrate some arthritis in her left hip with small subchondral cyst and some sclerosis of the femoral head.  Joint space might be slightly narrowed.  Long discussion regarding the findings and what she may expect over time.  She can use over-the-counter medicines.  I do not think she is at the point where she is having any compromise.  All questions were answered.  Has had a recent bone density that was apparently normal according to Frances Brown.  Concerns about her hip giving way but I do not think based on her exam, history or x-rays and that should be a problem  Follow-Up Instructions: Return if symptoms worsen or fail to improve.   Orders:  Orders Placed This Encounter  Procedures   XR HIP UNILAT W OR W/O PELVIS 2-3 VIEWS LEFT   No orders of the defined types were placed in this encounter.     Procedures: No procedures performed   Clinical Data: No additional findings.   Subjective: Chief Complaint  Patient presents with   Left Hip - Pain  Patient presents today for left hip and buttock pain. She said that her pain can move around between her lower back. Buttock, lateral left hip, and groin area. She states that the pain radiates into her left thigh. She feels like there is weakness in  her upper leg and gets concerned that it may give way. She has increased pain with prolonged sitting. She does not take anything for pain.  HPI  Review of Systems   Objective: Vital Signs: There were no vitals taken for this visit.  Physical Exam Constitutional:      Appearance: She is well-developed.  Eyes:     Pupils: Pupils are equal, round, and reactive to light.  Pulmonary:     Effort: Pulmonary effort is normal.  Skin:    General: Skin is warm and dry.  Neurological:     Mental Status: She is alert and oriented to person, place, and time.  Psychiatric:        Behavior: Behavior normal.    Ortho Exam awake alert and oriented x3.  Comfortable sitting up.  There might be a little decreased motion with internal extra rotation of her left hip compared to the right but showing no significant pain.  I cannot reproduce the groin or anterior thigh pain that she has been experiencing.  Straight leg raise negative.  Motor exam appears to be intact.  No pain over the greater trochanter  Specialty Comments:  No specialty comments available.  Imaging: XR HIP UNILAT W OR W/O PELVIS  2-3 VIEWS LEFT  Result Date: 09/19/2021 AP pelvis lateral left hip demonstrates some early arthritis of the left hip with small subchondral cysts and subchondral sclerosis.  There might be minimal narrowing of the joint space.  On the lateral film is a little bit more narrowing but no peripheral osteophytes.  Joint no evidence of avascular necrosis.  There is some calcification within the capsule which might be consistent with CPPD.  Patient is experiencing groin and anterior thigh pain which would indicate possible relationship to the hip    PMFS History: Patient Active Problem List   Diagnosis Date Noted   Pain in left hip 09/19/2021   Pain in right leg 07/15/2019   Gastroesophageal reflux disease 07/14/2018   Abdominal pain, left lower quadrant 07/09/2017   Hoarseness 03/11/2015   Cough 01/28/2014    HTN (hypertension) 12/27/2011   Hyperlipidemia 12/27/2011   Obesity 08/16/2011   IBS (irritable bowel syndrome) 05/23/2011   PALPITATIONS 12/21/2010   Chronic left upper quadrant pain 08/08/2010   Past Medical History:  Diagnosis Date   Allergy    Anxiety    no meds    Cancer ([place])    mole    Diabetes mellitus without complication ([place])    Diverticulosis    Fibrocystic breast disease    Fundic gland polyps of stomach, benign 03/24/2015   GERD (gastroesophageal reflux disease)    Hx of colonic polyps 04/24/2017   Hyperlipidemia    Hypertension    IBS (irritable bowel syndrome)    Macular degeneration    vitreous hemorrhage   Obesity    Papilledema, both eyes 07/28/2018   Dr Lorrene Reid   Tachycardia, unspecified 04/08/2017   pt. states she saw Dr. Heloise Ochoa for this no meds no procedures, wore monitor   Urge incontinence     Family History  Problem Relation Age of Onset   Diabetes Father    Heart failure Father    Hypertension Mother    Heart failure Mother    Diabetes Mother    Lung cancer Mother    Stroke Mother    Leukemia Other        grandfather   Cirrhosis Other        grandmother   Hypertension Sister    Colon cancer Maternal Uncle 50   Breast cancer Maternal Aunt    Colon polyps Neg Hx    Esophageal cancer Neg Hx    Rectal cancer Neg Hx    Stomach cancer Neg Hx     Past Surgical History:  Procedure Laterality Date   BREAST EXCISIONAL BIOPSY Left    over 15 years ( she thinks it may have been a cysts removal but there is a scar   breast lymph node removed     right   CHOLECYSTECTOMY     COLONOSCOPY  06/2002, 04/10/2007   diverticulosis, colon polyps, external hemorrhoids   KNEE ARTHROSCOPY     left   PARTIAL HYSTERECTOMY     POLYPECTOMY     SHOULDER SURGERY     right   UPPER GASTROINTESTINAL ENDOSCOPY  09/27/11   VIDEO BRONCHOSCOPY Bilateral 02/14/2015   Procedure: VIDEO BRONCHOSCOPY WITHOUT FLUORO;  Surgeon: Collene Gobble, MD;  Location: [facility];  Service: Cardiopulmonary;  Laterality: Bilateral;   WISDOM TOOTH EXTRACTION     Social History   Occupational History    Employer: RETIRED  Tobacco Use   Smoking status: Never   Smokeless tobacco: Never  Vaping Use   Vaping  Use: Never used  Substance and Sexual Activity   Alcohol use: No    Alcohol/week: 0.0 standard drinks   Drug use: No   Sexual activity: Yes    Birth control/protection: Post-menopausal

## 2021-09-27 DIAGNOSIS — Z1231 Encounter for screening mammogram for malignant neoplasm of breast: Secondary | ICD-10-CM | POA: Diagnosis not present

## 2021-10-17 DIAGNOSIS — E113593 Type 2 diabetes mellitus with proliferative diabetic retinopathy without macular edema, bilateral: Secondary | ICD-10-CM | POA: Diagnosis not present

## 2021-10-17 DIAGNOSIS — H353124 Nonexudative age-related macular degeneration, left eye, advanced atrophic with subfoveal involvement: Secondary | ICD-10-CM | POA: Diagnosis not present

## 2021-10-17 DIAGNOSIS — H353211 Exudative age-related macular degeneration, right eye, with active choroidal neovascularization: Secondary | ICD-10-CM | POA: Diagnosis not present

## 2021-10-17 DIAGNOSIS — H31093 Other chorioretinal scars, bilateral: Secondary | ICD-10-CM | POA: Diagnosis not present

## 2021-11-13 DIAGNOSIS — M8580 Other specified disorders of bone density and structure, unspecified site: Secondary | ICD-10-CM | POA: Diagnosis not present

## 2021-11-13 DIAGNOSIS — N8110 Cystocele, unspecified: Secondary | ICD-10-CM | POA: Diagnosis not present

## 2021-11-13 DIAGNOSIS — Z01411 Encounter for gynecological examination (general) (routine) with abnormal findings: Secondary | ICD-10-CM | POA: Diagnosis not present

## 2021-11-13 DIAGNOSIS — Z01419 Encounter for gynecological examination (general) (routine) without abnormal findings: Secondary | ICD-10-CM | POA: Diagnosis not present

## 2021-11-13 DIAGNOSIS — Z124 Encounter for screening for malignant neoplasm of cervix: Secondary | ICD-10-CM | POA: Diagnosis not present

## 2021-11-13 DIAGNOSIS — Z6828 Body mass index (BMI) 28.0-28.9, adult: Secondary | ICD-10-CM | POA: Diagnosis not present

## 2021-11-14 NOTE — Progress Notes (Deleted)
No chief complaint on file.    History of Present Illness: 75 yo female with history of SVT, DM, HTN, HLD and palpitations felt to be due to PACs who is here today for follow up. I saw her in 2012 for evaluation of palpitations and leg pain in the setting of steroid use for poison ivy treatment. I arranged a 48 Holter monitor and an echo. Her echo showed normal LV size and function. Her Holter monitor showed occasional premature atrial contractions but no VT or atrial fibrillation. I saw her in 2018 and she had c/o more frequent palpitations. Cardiac monitor April 2018 with PACs and several short runs of SVT. Echo April 2018 with normal LV systolic function and mild mitral regurgitation. Echo August 2021 with LVEF=60-65%, mild MR.   She is here today for follow up. The patient denies any chest pain, dyspnea, palpitations, lower extremity edema, orthopnea, PND, dizziness, near syncope or syncope.   Primary Care Physician: Ginger Organ., MD  Past Medical History:  Diagnosis Date   Allergy    Anxiety    no meds    Cancer [facility])    mole    Diabetes mellitus without complication ([place])    Diverticulosis    Fibrocystic breast disease    Fundic gland polyps of stomach, benign 03/24/2015   GERD (gastroesophageal reflux disease)    Hx of colonic polyps 04/24/2017   Hyperlipidemia    Hypertension    IBS (irritable bowel syndrome)    Macular degeneration    vitreous hemorrhage   Obesity    Papilledema, both eyes 07/28/2018   Dr Lorrene Reid   Tachycardia, unspecified 04/08/2017   pt. states she saw Dr. Heloise Ochoa for this no meds no procedures, wore monitor   Urge incontinence     Past Surgical History:  Procedure Laterality Date   BREAST EXCISIONAL BIOPSY Left    over 15 years ( she thinks it may have been a cysts removal but there is a scar   breast lymph node removed     right   CHOLECYSTECTOMY     COLONOSCOPY  06/2002, 04/10/2007   diverticulosis, colon polyps, external  hemorrhoids   KNEE ARTHROSCOPY     left   PARTIAL HYSTERECTOMY     POLYPECTOMY     SHOULDER SURGERY     right   UPPER GASTROINTESTINAL ENDOSCOPY  09/27/11   VIDEO BRONCHOSCOPY Bilateral 02/14/2015   Procedure: VIDEO BRONCHOSCOPY WITHOUT FLUORO;  Surgeon: Collene Gobble, MD;  Location: [facility];  Service: Cardiopulmonary;  Laterality: Bilateral;   WISDOM TOOTH EXTRACTION      Current Outpatient Medications  Medication Sig Dispense Refill   amLODipine (NORVASC) 5 MG tablet Take 1 tablet by mouth daily.     bisoprolol (ZEBETA) 10 MG tablet Take 10 mg by mouth daily.     cholecalciferol (VITAMIN D) 1000 units tablet Take 1,000 Units by mouth daily.     Coenzyme Q10 (COQ10) 100 MG CAPS Take 1 capsule by mouth daily.     esomeprazole (NEXIUM) 20 MG capsule Take 20 mg by mouth 2 (two) times daily before a meal.     loratadine (CLARITIN) 10 MG tablet Take 10 mg by mouth daily as needed for allergies.      metFORMIN (GLUCOPHAGE-XR) 500 MG 24 hr tablet Take 500 mg by mouth daily with breakfast.      Multiple Vitamins-Minerals (PRESERVISION AREDS 2 PO) Take 1 tablet by mouth daily.      nitrofurantoin (MACRODANTIN)  50 MG capsule Take 50 mg by mouth at bedtime.      olmesartan (BENICAR) 40 MG tablet Take 1 tablet by mouth daily.     rosuvastatin (CRESTOR) 10 MG tablet      vitamin B-12 (CYANOCOBALAMIN) 1000 MCG tablet Take 1,000 mcg by mouth daily.     No current facility-administered medications for this visit.    Allergies  Allergen Reactions   Janumet Xr [Sitagliptin-Metformin Hcl Er] Swelling   Tricor [Fenofibrate] Swelling   Zocor [Simvastatin] Swelling   Lovaza [Omega-3-Acid Ethyl Esters] Other (See Comments)    GI intolerance   Sulfa Antibiotics    Trimethoprim     Rash / hives that are painful   Elemental Sulfur Rash    Social History   Socioeconomic History   Marital status: Married    Spouse name: Not on file   Number of children: Not on file   Years of education:  Not on file   Highest education level: Not on file  Occupational History    Employer: RETIRED  Tobacco Use   Smoking status: Never   Smokeless tobacco: Never  Vaping Use   Vaping Use: Never used  Substance and Sexual Activity   Alcohol use: No    Alcohol/week: 0.0 standard drinks   Drug use: No   Sexual activity: Yes    Birth control/protection: Post-menopausal  Other Topics Concern   Not on file  Social History Narrative   Has one great Merchandiser, retail, Oceanographer.  Lives home with husband.  Has 2 children.  Caffeine rare.     Enjoys gardening   Social Determinants of Radio broadcast assistant Strain: Not on file  Food Insecurity: Not on file  Transportation Needs: Not on file  Physical Activity: Not on file  Stress: Not on file  Social Connections: Not on file  Intimate Partner Violence: Not on file    Family History  Problem Relation Age of Onset   Diabetes Father    Heart failure Father    Hypertension Mother    Heart failure Mother    Diabetes Mother    Lung cancer Mother    Stroke Mother    Leukemia Other        grandfather   Cirrhosis Other        grandmother   Hypertension Sister    Colon cancer Maternal Uncle 10   Breast cancer Maternal Aunt    Colon polyps Neg Hx    Esophageal cancer Neg Hx    Rectal cancer Neg Hx    Stomach cancer Neg Hx     Review of Systems:  As stated in the HPI and otherwise negative.   There were no vitals taken for this visit.  Physical Examination:  General: Well developed, well nourished, NAD  HEENT: OP clear, mucus membranes moist  SKIN: warm, dry. No rashes. Neuro: No focal deficits  Musculoskeletal: Muscle strength 5/5 all ext  Psychiatric: Mood and affect normal  Neck: No JVD, no carotid bruits, no thyromegaly, no lymphadenopathy.  Lungs:Clear bilaterally, no wheezes, rhonci, crackles Cardiovascular: Regular rate and rhythm. No murmurs, gallops or rubs. Abdomen:Soft. Bowel sounds  present. Non-tender.  Extremities: No lower extremity edema. Pulses are 2 + in the bilateral DP/PT.  Echo August 2021:   1. Left ventricular ejection fraction, by estimation, is 60 to 65%. The  left ventricle has normal function. The left ventricle has no regional  wall motion abnormalities. There is mild left ventricular hypertrophy  of  the basal-septal segment. Left  ventricular diastolic parameters are consistent with Grade I diastolic  dysfunction (impaired relaxation). Elevated left ventricular end-diastolic  pressure.   2. Right ventricular systolic function is normal. The right ventricular  size is normal. Tricuspid regurgitation signal is inadequate for assessing  PA pressure.   3. The mitral valve is normal in structure. Mild mitral valve  regurgitation. No evidence of mitral stenosis.   4. The aortic valve is tricuspid. Aortic valve regurgitation is not  visualized. Mild aortic valve sclerosis is present, with no evidence of  aortic valve stenosis.   5. The inferior vena cava is normal in size with greater than 50%  respiratory variability, suggesting right atrial pressure of 3 mmHg.    EKG:  EKG is ordered today. The ekg ordered today demonstrates sinus  Recent Labs: No results found for requested labs within last 8760 hours.    Wt Readings from Last 3 Encounters:  07/25/20 164 lb 12.8 oz (74.8 kg)  05/23/20 169 lb (76.7 kg)  05/18/20 169 lb (76.7 kg)    Other studies Reviewed: Additional studies/ records that were reviewed today include: . Review of the above records demonstrates:   Assessment and Plan:   1. PALPITATIONS/PAC/Paroxysmal SVT: Echo August 2021 with normal LV function. Rare palpitations. Continue beta blocker.        2. HTN: BP is well controlled. Continue current therapy  3. Mitral regurgitation: Mild by echo in 2021.   Current medicines are reviewed at length with the patient today.  The patient does not have concerns regarding  medicines.  The following changes have been made:  no change  Labs/ tests ordered today include:   No orders of the defined types were placed in this encounter.    Disposition:   F/U with me in 12  months   Signed, Lauree Chandler, MD 11/14/2021 1:50 PM    [facility] [address], [place], [place]  [address] Phone: [phone]; Fax: [phone]

## 2021-11-15 ENCOUNTER — Ambulatory Visit: Payer: Medicare Other | Admitting: Cardiovascular Disease

## 2021-11-21 DIAGNOSIS — E113593 Type 2 diabetes mellitus with proliferative diabetic retinopathy without macular edema, bilateral: Secondary | ICD-10-CM | POA: Diagnosis not present

## 2021-11-21 DIAGNOSIS — H353124 Nonexudative age-related macular degeneration, left eye, advanced atrophic with subfoveal involvement: Secondary | ICD-10-CM | POA: Diagnosis not present

## 2021-11-21 DIAGNOSIS — H31093 Other chorioretinal scars, bilateral: Secondary | ICD-10-CM | POA: Diagnosis not present

## 2021-11-21 DIAGNOSIS — H353211 Exudative age-related macular degeneration, right eye, with active choroidal neovascularization: Secondary | ICD-10-CM | POA: Diagnosis not present

## 2021-12-13 DIAGNOSIS — I129 Hypertensive chronic kidney disease with stage 1 through stage 4 chronic kidney disease, or unspecified chronic kidney disease: Secondary | ICD-10-CM | POA: Diagnosis not present

## 2021-12-13 DIAGNOSIS — N1832 Chronic kidney disease, stage 3b: Secondary | ICD-10-CM | POA: Diagnosis not present

## 2021-12-13 DIAGNOSIS — E1139 Type 2 diabetes mellitus with other diabetic ophthalmic complication: Secondary | ICD-10-CM | POA: Diagnosis not present

## 2021-12-13 DIAGNOSIS — E785 Hyperlipidemia, unspecified: Secondary | ICD-10-CM | POA: Diagnosis not present

## 2021-12-13 DIAGNOSIS — E1129 Type 2 diabetes mellitus with other diabetic kidney complication: Secondary | ICD-10-CM | POA: Diagnosis not present

## 2021-12-26 DIAGNOSIS — H353124 Nonexudative age-related macular degeneration, left eye, advanced atrophic with subfoveal involvement: Secondary | ICD-10-CM | POA: Diagnosis not present

## 2021-12-26 DIAGNOSIS — H353211 Exudative age-related macular degeneration, right eye, with active choroidal neovascularization: Secondary | ICD-10-CM | POA: Diagnosis not present

## 2021-12-26 DIAGNOSIS — E113593 Type 2 diabetes mellitus with proliferative diabetic retinopathy without macular edema, bilateral: Secondary | ICD-10-CM | POA: Diagnosis not present

## 2021-12-26 DIAGNOSIS — H31093 Other chorioretinal scars, bilateral: Secondary | ICD-10-CM | POA: Diagnosis not present

## 2022-01-08 ENCOUNTER — Encounter: Payer: Self-pay | Admitting: Internal Medicine

## 2022-01-08 ENCOUNTER — Telehealth: Payer: Self-pay | Admitting: Internal Medicine

## 2022-01-08 DIAGNOSIS — N3020 Other chronic cystitis without hematuria: Secondary | ICD-10-CM | POA: Diagnosis not present

## 2022-01-08 DIAGNOSIS — R109 Unspecified abdominal pain: Secondary | ICD-10-CM | POA: Diagnosis not present

## 2022-01-08 DIAGNOSIS — K5790 Diverticulosis of intestine, part unspecified, without perforation or abscess without bleeding: Secondary | ICD-10-CM | POA: Diagnosis not present

## 2022-01-08 DIAGNOSIS — I700 Atherosclerosis of aorta: Secondary | ICD-10-CM | POA: Diagnosis not present

## 2022-01-08 NOTE — Telephone Encounter (Signed)
Left message for pt to call back  °

## 2022-01-08 NOTE — Telephone Encounter (Signed)
Pt states that she has been recently been treated for an UTI. Pt states that she continued to have lower abdominal pain. Pt had CT  scan done at [facility] and Dr. stated that it seemed that she had some kind of infection. Dr. started her on another antibiotic and wanted her to follow up with her Primary GI Dr. Abbott Pao scheduled to see Tye Savoy NP  on  01/12/2022 @ 1:30: Pt made aware Pt verbalized understanding with all questions answered.

## 2022-01-08 NOTE — Telephone Encounter (Signed)
Patient called states she was just seen by her doctor and they did an [place] results still pending but she said they told her she has an infection somewhere so they will be providing her with antibiotics. They also advised her to call her GI provider to seek further advise on this. She is asking if you can call her within the hour to discuss further before she goes to get the medication not sure if she should be taking them.

## 2022-01-09 ENCOUNTER — Telehealth: Payer: Self-pay | Admitting: Internal Medicine

## 2022-01-09 ENCOUNTER — Other Ambulatory Visit: Payer: Self-pay

## 2022-01-09 DIAGNOSIS — K5732 Diverticulitis of large intestine without perforation or abscess without bleeding: Secondary | ICD-10-CM

## 2022-01-09 MED ORDER — METRONIDAZOLE 500 MG PO TABS: 500.0000 mg | ORAL_TABLET | Freq: Two times a day (BID) | ORAL | 0 refills | Status: DC

## 2022-01-09 NOTE — Telephone Encounter (Signed)
Pt made aware of Dr. Carlean Purl recommendations: Prescription sent to pharmacy that was requested. Pt made aware  Pt states that she does NOT have the severe pain that she had. Pt stated that she is just a little sore now.  Pt verbalized understanding with all questions answered

## 2022-01-09 NOTE — Telephone Encounter (Signed)
Left Message for pt to call back  °

## 2022-01-09 NOTE — Telephone Encounter (Signed)
Patient returned your call, please advise. 

## 2022-01-09 NOTE — Telephone Encounter (Signed)
Received fax ct report dx diverticulitis from [facility]  Patient has appointment w/ [facility] NP on Fri 2/3  Please see if they started patient on antibiotics - I think they rxed cipro 50 mg bid  If they did also Rx metronidazole 500 mg bid x 7 d  She should be on low fiber diet  I di not think we need to move up appointment otherwise but get a symptom update please

## 2022-01-12 ENCOUNTER — Encounter: Payer: Self-pay | Admitting: Nurse Practitioner

## 2022-01-12 ENCOUNTER — Ambulatory Visit (INDEPENDENT_AMBULATORY_CARE_PROVIDER_SITE_OTHER): Payer: Medicare Other | Admitting: Nurse Practitioner

## 2022-01-12 VITALS — BP 110/60 | HR 68 | Ht 64.5 in | Wt 165.0 lb

## 2022-01-12 DIAGNOSIS — K5732 Diverticulitis of large intestine without perforation or abscess without bleeding: Secondary | ICD-10-CM | POA: Diagnosis not present

## 2022-01-12 NOTE — Patient Instructions (Addendum)
Please complete the course of Metronidazole and Cipro. Call our early next week (after completion of antibiotics) if you have not continued to get better. Call us before then if your abdominal pain gets worse or you develop at fever.   If you are age 78 or older, your body mass index should be between 23-30. Your Body mass index is 27.88 kg/m. If this is out of the aforementioned range listed, please consider follow up with your Primary Care Provider.  If you are age 49 or younger, your body mass index should be between 19-25. Your Body mass index is 27.88 kg/m. If this is out of the aformentioned range listed, please consider follow up with your Primary Care Provider.   ________________________________________________________  The  GI providers would like to encourage you to use [facility] to communicate with providers for non-urgent requests or questions.  Due to long hold times on the telephone, sending your provider a message by [facility] may be a faster and more efficient way to get a response.  Please allow 48 business hours for a response.  Please remember that this is for non-urgent requests.  _______________________________________________________

## 2022-01-12 NOTE — Progress Notes (Signed)
ASSESSMENT AND PLAN    #76 year old female with uncomplicated sigmoid diverticula was found on noncontrast CT scan done by [facility].  Improving on Cipro and Flagyl.  Still some discomfort with walking around/moving about but nontender on exam.  Nontoxic-appearing.  -- She has 2 to 3 days left of antibiotics.  I recommend she complete the full 7-day course of antibiotics.  --Recommend she continue on a low fiber diet until symptoms resolve.  -- She will call us early next week if there has not been continued improvement.  We may opt to extend course of antibiotics at that time.  Certainly if she gets worse patient will call in the interim.   # History of colon polyps. Her last colonoscopy with removal of adenomatous polyps was June 2021. No recall colonoscopy needed due to age.   HISTORY OF PRESENT ILLNESS    Chief Complaint : diverticulitis  Frances Brown is a 76 y.o. female with a past medical history of colon polyps, diverticulitis, GERD, IBS, hyperlipidemia, hypertension, diabetes.  Additional medical history as listed in [facility] .   Patient being seen at request of Daine Gravel, NP with [facility]  Patient is known to Dr.  Carlean Purl. She was seen in 2018 for left sided abdominal pain which was really more of a chronic problem. She had not had any documented episodes of diverticulitis but we opted to treat empirically at that time. She has had any recurrent left sided abdominal pain until just recently.    Last week she developed painful urination. Following that she developed stabbing generalized lower abdominal pain. Saw [facility] ( history of UTI). UA was unremarkable so CT scan obtained. Non-contrast CT scan showed descending and sigmoid diverticulosis. Wall thickening and fat stranding of the distal sigmoid consistent with acute diverticulitis. She was started on Cipro  01/08/22 patient called the office to discuss CT scan findings. We added flagyl to the cipro  and gave her a follow up appt.    INTERVAL HISTORY:  She has about 3 days left of antibiotics. She still has LLQ discomfort but it is mainly when walking / moving about / driving over bumps. No fevers but has had some chills. Urinating okay.    PREVIOUS IMAGING   01/08/22 Non Contrast CT scan wo contrast - [facility] --Descending and sigmoid diverticulosis. Wall thickening and fat stranding of the distal sigmoid, consissent with acute diverticulitis. No perforation or abscess, Small hiatal hernia.  Will send report to be scanned  PREVIOUS ENDOSCOPIC EVALUATIONS    June 2021 colonoscopy Four diminutive polyps in the sigmoid colon, in the transverse colon and in the ascending colon, removed with a cold snare. Resected and retrieved. - Diverticulosis in the sigmoid colon and in the descending colon. - The examination was otherwise normal on direct and retroflexion views. - Personal history of colonic polyps 5 and 10 mm ssp/a's 2018. Surgical [P], colon, ascending, transverse, sigmoid, polyp (4) - SESSILE SERRATED POLYP (1 OF 4 FRAGMENTS) - BENIGN COLONIC MUCOSA (3 OF 4 FRAGMENTS) - NO HIGH GRADE DYSPLASIA OR MALIGNANCY IDENTIFIED   ** No recall needed due to age.    Past Medical History:  Diagnosis Date   Allergy    Anxiety    no meds    Cancer [facility])    mole    Diabetes mellitus without complication ([place])    Diverticulosis    Fibrocystic breast disease    Fundic gland polyps of stomach, benign 03/24/2015  GERD (gastroesophageal reflux disease)    Hx of colonic polyps 04/24/2017   Hyperlipidemia    Hypertension    IBS (irritable bowel syndrome)    Macular degeneration    vitreous hemorrhage, does injections in rt every 5 weeks   Obesity    Papilledema, both eyes 07/28/2018   Dr Lorrene Reid   Tachycardia, unspecified 04/08/2017   pt. states she saw Dr. Heloise Ochoa for this no meds no procedures, wore monitor   Urge incontinence     Past Surgical History:   Procedure Laterality Date   BREAST EXCISIONAL BIOPSY Left    over 15 years ( she thinks it may have been a cysts removal but there is a scar   breast lymph node removed     right   CHOLECYSTECTOMY     COLONOSCOPY  06/2002, 04/10/2007   diverticulosis, colon polyps, external hemorrhoids   KNEE ARTHROSCOPY     left   PARTIAL HYSTERECTOMY     POLYPECTOMY     SHOULDER SURGERY     right   UPPER GASTROINTESTINAL ENDOSCOPY  09/27/11   VIDEO BRONCHOSCOPY Bilateral 02/14/2015   Procedure: VIDEO BRONCHOSCOPY WITHOUT FLUORO;  Surgeon: Collene Gobble, MD;  Location: [facility];  Service: Cardiopulmonary;  Laterality: Bilateral;   WISDOM TOOTH EXTRACTION     Current Medications, Allergies, Family History and Social History were reviewed in [employer] record.     Current Outpatient Medications  Medication Sig Dispense Refill   amLODipine (NORVASC) 5 MG tablet Take 1 tablet by mouth daily.     bisoprolol (ZEBETA) 10 MG tablet Take 10 mg by mouth daily.     cholecalciferol (VITAMIN D) 1000 units tablet Take 1,000 Units by mouth daily.     Coenzyme Q10 (COQ10) 100 MG CAPS Take 1 capsule by mouth daily.     esomeprazole (NEXIUM) 20 MG capsule Take 20 mg by mouth 2 (two) times daily before a meal.     loratadine (CLARITIN) 10 MG tablet Take 10 mg by mouth daily as needed for allergies.      metFORMIN (GLUCOPHAGE-XR) 500 MG 24 hr tablet Take 500 mg by mouth daily with breakfast.      metroNIDAZOLE (FLAGYL) 500 MG tablet Take 1 tablet (500 mg total) by mouth 2 (two) times daily for 7 days. 14 tablet 0   Multiple Vitamins-Minerals (PRESERVISION AREDS 2 PO) Take 1 tablet by mouth daily.      nitrofurantoin (MACRODANTIN) 50 MG capsule Take 50 mg by mouth at bedtime.      olmesartan (BENICAR) 40 MG tablet Take 1 tablet by mouth daily.     rosuvastatin (CRESTOR) 10 MG tablet      vitamin B-12 (CYANOCOBALAMIN) 1000 MCG tablet Take 1,000 mcg by mouth daily.     No current  facility-administered medications for this visit.    Review of Systems: No chest pain. No shortness of breath. No urinary complaints.   PHYSICAL EXAM :    Wt Readings from Last 3 Encounters:  01/12/22 165 lb (74.8 kg)  07/25/20 164 lb 12.8 oz (74.8 kg)  05/23/20 169 lb (76.7 kg)    Wt 165 lb (74.8 kg)    BMI 28.32 kg/m  Constitutional:  Generally well appearing female in no acute distress. Psychiatric: Pleasant. Normal mood and affect. Behavior is normal. EENT: Pupils normal.  Conjunctivae are normal. No scleral icterus. Neck supple.  Cardiovascular: Normal rate, regular rhythm. No edema Pulmonary/chest: Effort normal and breath sounds normal. No wheezing, rales  or rhonchi. Abdominal: Soft, nondistended, nontender. Bowel sounds active throughout. There are no masses palpable. No hepatomegaly. Neurological: Alert and oriented to person place and time. Skin: Skin is warm and dry. No rashes noted.  Tye Savoy, NP  01/12/2022, 1:37 PM  Cc:  Daine Gravel, NP [facility]

## 2022-01-15 ENCOUNTER — Telehealth: Payer: Self-pay | Admitting: Nurse Practitioner

## 2022-01-15 ENCOUNTER — Other Ambulatory Visit: Payer: Self-pay | Admitting: Nurse Practitioner

## 2022-01-15 MED ORDER — AMOXICILLIN-POT CLAVULANATE 875-125 MG PO TABS: 1.0000 | ORAL_TABLET | Freq: Two times a day (BID) | ORAL | 0 refills | Status: DC

## 2022-01-15 NOTE — Telephone Encounter (Signed)
Patient called stating she was instructed to call back after she finished her antibiotics.  She finished last night and says she is still experiencing soreness on her left side.  Please call patient and advise.  Thank you.

## 2022-01-15 NOTE — Telephone Encounter (Signed)
It doesn't appear that she has a pcn allergy. Lets given her augmentin 875 mg BID x 7 days. Please continue low fiber diet. If she doesn't improve with that, or if gets worse in interim then call us because we may need to repeat CT to make sure not developing abscess or other complications.    Patient informed and I confirmed pharmacy before sending in rx. She will start tomorrow after finishing the other antibiotics tonight.

## 2022-01-17 NOTE — Telephone Encounter (Signed)
Inbound call from patient states she is still very tender and sore

## 2022-01-17 NOTE — Telephone Encounter (Signed)
I called and Frances Brown has been on the Augmentin since Monday night. The pain is waking her up at night. Still left sided. Pain when she pushes to have a bowel movement.No diarrhea or constipation-stool is soft and alittle more frequent. No fever. Please advise?

## 2022-01-19 NOTE — Telephone Encounter (Signed)
Patient called this morning to "report in."  Please give her a call when you can.  Try her home number first, then if you can't reach her there, please try her cell.  Thank you.

## 2022-01-19 NOTE — Telephone Encounter (Signed)
Per Tye Savoy NP-C patient informed to stay on her antibiotics and call us back Monday with an update. She will continue her soft diet. She is aware if she needs help over the weekend to call our main # and talk with the Dr on call.

## 2022-01-19 NOTE — Telephone Encounter (Signed)
[name] said the pain is still there but not as strong, no fever that she is aware of. She will finish her last antibiotic pill Sunday night. If [name] refills the antibiotics please send to [facility] on [address].

## 2022-01-23 NOTE — Telephone Encounter (Signed)
I called to check on Frances Brown and she finished her antibiotics yesterday AM. No fever, today has eaten a regular diet after mostly eating a diet of rice. She said she is still alittle tender but not as tender and no trouble having a BM. She is having gas and belching that she didn't have before. Please advise if any other treatment plan. Thank you.

## 2022-01-25 NOTE — Telephone Encounter (Signed)
Just touching base on this one, thank you.

## 2022-01-26 NOTE — Telephone Encounter (Signed)
I told Frances Brown what [name] said and she will try the gas x. She is better and said thank you for helping her. She now wants to know if she should have antibiotics on hand if this happens again. I will route to Dr Carlean Purl to advise on this matter and I will call her back. She uses [facility] in [place] on [address] If we do send something in.

## 2022-01-26 NOTE — Telephone Encounter (Signed)
Nothing on hand. Call back if recurs.

## 2022-01-26 NOTE — Telephone Encounter (Signed)
Patient informed. 

## 2022-02-01 DIAGNOSIS — I700 Atherosclerosis of aorta: Secondary | ICD-10-CM | POA: Diagnosis not present

## 2022-02-01 DIAGNOSIS — E781 Pure hyperglyceridemia: Secondary | ICD-10-CM | POA: Diagnosis not present

## 2022-02-01 DIAGNOSIS — M8580 Other specified disorders of bone density and structure, unspecified site: Secondary | ICD-10-CM | POA: Diagnosis not present

## 2022-02-01 DIAGNOSIS — E1139 Type 2 diabetes mellitus with other diabetic ophthalmic complication: Secondary | ICD-10-CM | POA: Diagnosis not present

## 2022-02-01 DIAGNOSIS — E785 Hyperlipidemia, unspecified: Secondary | ICD-10-CM | POA: Diagnosis not present

## 2022-02-01 DIAGNOSIS — E1129 Type 2 diabetes mellitus with other diabetic kidney complication: Secondary | ICD-10-CM | POA: Diagnosis not present

## 2022-02-01 DIAGNOSIS — I129 Hypertensive chronic kidney disease with stage 1 through stage 4 chronic kidney disease, or unspecified chronic kidney disease: Secondary | ICD-10-CM | POA: Diagnosis not present

## 2022-02-01 DIAGNOSIS — N1832 Chronic kidney disease, stage 3b: Secondary | ICD-10-CM | POA: Diagnosis not present

## 2022-02-06 DIAGNOSIS — E113593 Type 2 diabetes mellitus with proliferative diabetic retinopathy without macular edema, bilateral: Secondary | ICD-10-CM | POA: Diagnosis not present

## 2022-02-06 DIAGNOSIS — H353211 Exudative age-related macular degeneration, right eye, with active choroidal neovascularization: Secondary | ICD-10-CM | POA: Diagnosis not present

## 2022-02-06 DIAGNOSIS — H353124 Nonexudative age-related macular degeneration, left eye, advanced atrophic with subfoveal involvement: Secondary | ICD-10-CM | POA: Diagnosis not present

## 2022-02-12 ENCOUNTER — Telehealth: Payer: Self-pay | Admitting: Nurse Practitioner

## 2022-02-12 NOTE — Telephone Encounter (Signed)
Spoke with the patient. She is calling with complaints of "that same raw, sore feeling I had with diverticulitis" Patient was treated with 2 rounds of antibiotics for CT confirmed diverticulitis. She was treated first with Cipro and Flagyl. This was followed by a short course of Augmentin. She completed her antibiotic 01/23/22. She began fiber immediatly after that. She has been on a regular diet as well. She began having pain in the left lower abdomen again yesterday. It is tender if she pushes on the area AS WELL. She is moving soft stool without difficulty. ?Please advise. ?Pharmacy [address], [place].  ?  ?

## 2022-02-13 ENCOUNTER — Encounter (HOSPITAL_BASED_OUTPATIENT_CLINIC_OR_DEPARTMENT_OTHER): Payer: Self-pay

## 2022-02-13 ENCOUNTER — Encounter: Payer: Self-pay | Admitting: Nurse Practitioner

## 2022-02-13 ENCOUNTER — Other Ambulatory Visit: Payer: Self-pay

## 2022-02-13 ENCOUNTER — Other Ambulatory Visit (INDEPENDENT_AMBULATORY_CARE_PROVIDER_SITE_OTHER): Payer: Medicare Other

## 2022-02-13 ENCOUNTER — Ambulatory Visit (HOSPITAL_BASED_OUTPATIENT_CLINIC_OR_DEPARTMENT_OTHER)
Admission: RE | Admit: 2022-02-13 | Discharge: 2022-02-13 | Disposition: A | Discharge status: Home / Self Care | Payer: Medicare Other | Source: Ambulatory Visit | Attending: Nurse Practitioner | Admitting: Nurse Practitioner

## 2022-02-13 ENCOUNTER — Telehealth: Payer: Self-pay | Admitting: Nurse Practitioner

## 2022-02-13 DIAGNOSIS — R1032 Left lower quadrant pain: Secondary | ICD-10-CM

## 2022-02-13 DIAGNOSIS — K5732 Diverticulitis of large intestine without perforation or abscess without bleeding: Secondary | ICD-10-CM

## 2022-02-13 LAB — BASIC METABOLIC PANEL
BUN: 25 mg/dL — ABNORMAL HIGH (ref 6–23)
CO2: 28 mEq/L (ref 19–32)
Calcium: 9.1 mg/dL (ref 8.4–10.5)
Chloride: 105 mEq/L (ref 96–112)
Creatinine, Ser: 1.11 mg/dL (ref 0.40–1.20)
GFR: 47.91 mL/min — ABNORMAL LOW (ref >60.00)
Glucose, Bld: 140 mg/dL — ABNORMAL HIGH (ref 70–99)
Potassium: 4.3 mEq/L (ref 3.5–5.1)
Sodium: 139 mEq/L (ref 135–145)

## 2022-02-13 MED ORDER — IOHEXOL 300 MG/ML  SOLN
100.0000 mL | Freq: Once | INTRAMUSCULAR | Status: AC | PRN
  Administered 2022-02-13: 85 mL via INTRAVENOUS

## 2022-02-13 NOTE — Telephone Encounter (Signed)
Spoke with the patient. She agrees to this plan. Today she is "about the same" and "maybe not quite as severe as yesterday." Remains afebrile. She will eat soft, low residue diet. Labs today. Order is in [place]. No pre-cert is required for the CT A/P w contrast. CT tomorrow at [facility] location. Patient will go there after her lab draw today for her contrast and instructions. Call with any questions or concerns. ?

## 2022-02-13 NOTE — Telephone Encounter (Signed)
Patient called and wanted to let you know that she would be getting her CT at [place] today at 4 p.m. instead of tomorrow.  They had an opening and she decided to go ahead and take it and get it over with.  She also came and had her labs done here today as well. ?

## 2022-02-14 ENCOUNTER — Ambulatory Visit (HOSPITAL_BASED_OUTPATIENT_CLINIC_OR_DEPARTMENT_OTHER): Payer: Medicare Other

## 2022-02-14 ENCOUNTER — Telehealth: Payer: Self-pay

## 2022-02-14 NOTE — Telephone Encounter (Signed)
Patient requesting results from CT done yesterday. Please advise.   ?

## 2022-02-14 NOTE — Telephone Encounter (Signed)
Opened in error

## 2022-02-15 ENCOUNTER — Other Ambulatory Visit: Payer: Self-pay

## 2022-02-15 ENCOUNTER — Telehealth: Payer: Self-pay

## 2022-02-15 MED ORDER — AMOXICILLIN-POT CLAVULANATE 875-125 MG PO TABS: 1.0000 | ORAL_TABLET | Freq: Two times a day (BID) | ORAL | 0 refills | Status: DC

## 2022-02-15 NOTE — Telephone Encounter (Signed)
-----   Message from Willia Craze, NP sent at 02/15/2022  2:04 PM EST ----- ?Beth, please tell her I spoke with Dr. Carlean Purl. He would like for her to have more antibiotics and  probably some MiraLAX if she is not using that to try to evacuate, and follow-up in the office. Please send in a 14-day course of Augmentin 875 mg BID. Seems like she would benefit from some more aggressive MiraLAX than once a day at least in the beginning based upon the stool that is seen there.  ? ?Thanks ? ?

## 2022-02-15 NOTE — Telephone Encounter (Signed)
Spoke with the patient. She is advised that [name] is reviewing this imaging with Dr Carlean Purl. She will remain on a soft low residue diet. ?

## 2022-02-15 NOTE — Telephone Encounter (Signed)
Order sent to patient's pharmacy for 14 course of Augmentin and gave instructions for taking. Patient asked about the CT results, and I gave her the generalized results. She states she has had some diarrhea since yesterdays CT, so does not need Miralax right now. Scheduled office visit with Dr. Carlean Purl on 03/05/22 ( after she will have finished the antibiotic) ?

## 2022-03-05 ENCOUNTER — Ambulatory Visit (INDEPENDENT_AMBULATORY_CARE_PROVIDER_SITE_OTHER): Payer: Medicare Other | Admitting: Internal Medicine

## 2022-03-05 ENCOUNTER — Encounter: Payer: Self-pay | Admitting: Internal Medicine

## 2022-03-05 VITALS — BP 102/60 | HR 64 | Ht 64.0 in | Wt 163.0 lb

## 2022-03-05 DIAGNOSIS — R142 Eructation: Secondary | ICD-10-CM

## 2022-03-05 DIAGNOSIS — K5732 Diverticulitis of large intestine without perforation or abscess without bleeding: Secondary | ICD-10-CM | POA: Diagnosis not present

## 2022-03-05 DIAGNOSIS — E669 Obesity, unspecified: Secondary | ICD-10-CM

## 2022-03-05 DIAGNOSIS — E8881 Metabolic syndrome: Secondary | ICD-10-CM

## 2022-03-05 MED ORDER — AMOXICILLIN-POT CLAVULANATE 875-125 MG PO TABS: 1.0000 | ORAL_TABLET | Freq: Two times a day (BID) | ORAL | 0 refills | Status: DC

## 2022-03-05 NOTE — Patient Instructions (Signed)
If you are age 78 or older, your body mass index should be between 23-30. Your Body mass index is 27.98 kg/m?Frances Brown If this is out of the aforementioned range listed, please consider follow up with your Primary Care Provider. ? ?If you are age 60 or younger, your body mass index should be between 19-25. Your Body mass index is 27.98 kg/m?Frances Brown If this is out of the aformentioned range listed, please consider follow up with your Primary Care Provider.  ? ?________________________________________________________ ? ?The [facility] providers would like to encourage you to use [facility] to communicate with providers for non-urgent requests or questions.  Due to long hold times on the telephone, sending your provider a message by [facility] may be a faster and more efficient way to get a response.  Please allow 48 business hours for a response.  Please remember that this is for non-urgent requests.  ?_______________________________________________________ ? ?We have sent the following medications to your pharmacy for you to pick up at your convenience: ?Augmentin- if you use this call us. ? ?We are providing you with diet information to read. ? ?I appreciate the opportunity to care for you. ?Silvano Rusk, MD, [facility] ?

## 2022-03-05 NOTE — Progress Notes (Signed)
? ?Frances Brown y.o. February 05, 1946 [id number] ? ?Assessment & Plan:  ? ?Encounter Diagnoses  ?Name Primary?  ? Diverticulitis of colon Yes  ? Abdominal obesity and metabolic syndrome   ? Belching   ? ? ?Diverticulitis has resolved. ?She will monitor for signs or symptoms of recurrence and I have given her a prescription of Augmentin x10 days to have at home and she is to contact me if she needs to take this. ? ?Regarding her abdominal obesity and metabolic syndrome, she would like to take less medications.  She has some side effects from the [place].  I do not think that is causing the belching though I suppose it is possible.  We talked some about reducing processed carbs in the diet and how that could help and switching over to healthier fats and proteins.  Fiber from aboveground vegetables as well.  A handout was provided.  The basic idea is to eat real food not processed food.  I did explain that it is likely she will still need medications but changing her diet is worth attempting for her overall health.  She will follow-up with Dr. Brigitte Pulse. ? ?Subjective:  ? ?Chief Complaint: f/u diverticulitis and gas ? ?HPI ?Frances Brown presents for follow-up having seen Tye Savoy recently, she had diverticulitis diagnosed at [facility]rology in January through CT scanning and then had persistent symptoms despite some rounds of antibiotics including Cipro and metronidazole x2 and was eventually treated a third time with Augmentin generic.  She feels like the diverticulitis symptoms have resolved. ? ?She does think she is belching more often than she should.  She reports having been on Nexium in the past for heartburn which helped but then was told not to take it because of increased risk of problems with her kidneys.  She is using what I think is an over-the-counter H2 blocker intermittently now.  Sometimes she feels bloated and would like to lose some abdominal fat/weight.  She says Wilder Glade gives her vaginal infections  at times and she would like to come off of that. ? ? ?02/14/22 CT ? ?IMPRESSION: ?1. There is interval improvement in the inflammatory changes ?associated with the distal posterior sigmoid in the area of acute ?diverticulitis seen on the prior 01/08/2022 CT. There is now ?persistent mild-to-moderate wall thickening and adjacent ?inflammatory fat stranding. No perforation or abscess. No fluid ?collection. ?2. Unchanged mild-to-moderate hiatal hernia. ?  ?  ?Electronically Signed ?  By: Yvonne Kendall M.D. ?  On: 02/14/2022 09:12 ? ? ?01/12/2022 visit Tye Savoy, NP ?[name] is a 76 y.o. female with a past medical history of colon polyps, diverticulitis, GERD, IBS, hyperlipidemia, hypertension, diabetes.  Additional medical history as listed in [place] .  ?  ?Patient being seen at request of Daine Gravel, NP with [facility] ?  ?Patient is known to Dr.  Carlean Purl. She was seen in 2018 for left sided abdominal pain which was really more of a chronic problem. She had not had any documented episodes of diverticulitis but we opted to treat empirically at that time. She has had any recurrent left sided abdominal pain until just recently.   ?  ?Last week she developed painful urination. Following that she developed stabbing generalized lower abdominal pain. Saw [facility] ( history of UTI). UA was unremarkable so CT scan obtained. Non-contrast CT scan showed descending and sigmoid diverticulosis. Wall thickening and fat stranding of the distal sigmoid consistent with acute diverticulitis. She was started on Cipro ?  ?  01/08/22 patient called the office to discuss CT scan findings. We added flagyl to the cipro and gave her a follow up appt.  ?  ?INTERVAL HISTORY:  ?She has about 3 days left of antibiotics. She still has LLQ discomfort but it is mainly when walking / moving about / driving over bumps. No fevers but has had some chills. Urinating okay ? ?Colonoscopy 05/2020- Four diminutive polyps in the sigmoid  colon, in the transverse colon and in the ascending diminutive SSP x1 others not true polyps no recall ?colon, removed with a cold snare. Resected and retrieved. ?- Diverticulosis in the sigmoid colon and in the descending colon. ?- The examination was otherwise normal on direct and retroflexion views. ?- Personal history of colonic polyps 5 and 10 mm ssp/a's 2018. ? ? ? ? ? ?Allergies  ?Allergen Reactions  ? Janumet Xr [Sitagliptin-Metformin Hcl Er] Swelling  ? Tricor [Fenofibrate] Swelling  ? Zocor [Simvastatin] Swelling  ? Lovaza [Omega-3-Acid Ethyl Esters] Other (See Comments)  ?  GI intolerance  ? Sulfa Antibiotics   ? Trimethoprim   ?  Rash / hives that are painful  ? Elemental Sulfur Rash  ? ?Current Meds  ?Medication Sig  ? bisoprolol (ZEBETA) 10 MG tablet Take 10 mg by mouth daily.  ? cholecalciferol (VITAMIN D) 1000 units tablet Take 1,000 Units by mouth daily.  ? Coenzyme Q10 (COQ10) 100 MG CAPS Take 1 capsule by mouth daily.  ? dapagliflozin propanediol (FARXIGA) 10 MG TABS tablet Take 1 tablet by mouth daily.  ? loratadine (CLARITIN) 10 MG tablet Take 10 mg by mouth daily as needed for allergies.   ? metFORMIN (GLUCOPHAGE-XR) 500 MG 24 hr tablet Take 500 mg by mouth daily with breakfast.   ? Multiple Vitamins-Minerals (PRESERVISION AREDS 2 PO) Take 1 tablet by mouth daily.   ? olmesartan (BENICAR) 40 MG tablet Take 1 tablet by mouth daily.  ? rosuvastatin (CRESTOR) 10 MG tablet Patient takes 3 times a week.  ? vitamin B-12 (CYANOCOBALAMIN) 1000 MCG tablet Take 1,000 mcg by mouth daily.  ? ?Past Medical History:  ?Diagnosis Date  ? Allergy   ? Anxiety   ? no meds   ? Cancer [facility])   ? mole   ? Diabetes mellitus without complication ([place])   ? Diverticulosis   ? Fibrocystic breast disease   ? Fundic gland polyps of stomach, benign 03/24/2015  ? GERD (gastroesophageal reflux disease)   ? Hx of colonic polyps 04/24/2017  ? Hyperlipidemia   ? Hypertension   ? IBS (irritable bowel syndrome)   ? Macular  degeneration   ? vitreous hemorrhage, does injections in rt every 5 weeks  ? Obesity   ? Papilledema, both eyes 07/28/2018  ? Dr Lorrene Reid  ? Tachycardia, unspecified 04/08/2017  ? pt. states she saw Dr. Heloise Ochoa for this no meds no procedures, wore monitor  ? Urge incontinence   ? ?Past Surgical History:  ?Procedure Laterality Date  ? BREAST EXCISIONAL BIOPSY Left   ? over 15 years ( she thinks it may have been a cysts removal but there is a scar  ? breast lymph node removed    ? right  ? CHOLECYSTECTOMY    ? COLONOSCOPY  06/2002, 04/10/2007  ? diverticulosis, colon polyps, external hemorrhoids  ? KNEE ARTHROSCOPY    ? left  ? PARTIAL HYSTERECTOMY    ? POLYPECTOMY    ? SHOULDER SURGERY    ? right  ? UPPER GASTROINTESTINAL ENDOSCOPY  09/27/11  ?  VIDEO BRONCHOSCOPY Bilateral 02/14/2015  ? Procedure: VIDEO BRONCHOSCOPY WITHOUT FLUORO;  Surgeon: Collene Gobble, MD;  Location: [facility];  Service: Cardiopulmonary;  Laterality: Bilateral;  ? WISDOM TOOTH EXTRACTION    ? ?Social History  ? ?Social History Narrative  ? Has one great Merchandiser, retail, Oceanographer.  Lives home with husband.  Has 2 children.  Caffeine rare.    ? Enjoys gardening  ? ?family history includes Breast cancer in her maternal aunt; Cirrhosis in an other family member; Colon cancer (age of onset: 40) in her maternal uncle; Diabetes in her father and mother; Heart failure in her father and mother; Hypertension in her mother and sister; Leukemia in an other family member; Lung cancer in her mother; Stroke in her mother. ? ? ?Review of Systems ?As above ? ?Objective:  ? Physical Exam ?BP 102/60   Pulse 64   Ht '5\' 4"'$  (1.626 m)   Wt 163 lb (73.9 kg)   BMI 27.98 kg/m?  ?Abd obese,soft and NT ? ?

## 2022-03-20 DIAGNOSIS — H353124 Nonexudative age-related macular degeneration, left eye, advanced atrophic with subfoveal involvement: Secondary | ICD-10-CM | POA: Diagnosis not present

## 2022-03-20 DIAGNOSIS — H353211 Exudative age-related macular degeneration, right eye, with active choroidal neovascularization: Secondary | ICD-10-CM | POA: Diagnosis not present

## 2022-03-20 DIAGNOSIS — E113593 Type 2 diabetes mellitus with proliferative diabetic retinopathy without macular edema, bilateral: Secondary | ICD-10-CM | POA: Diagnosis not present

## 2022-04-04 DIAGNOSIS — N1832 Chronic kidney disease, stage 3b: Secondary | ICD-10-CM | POA: Diagnosis not present

## 2022-04-04 DIAGNOSIS — I129 Hypertensive chronic kidney disease with stage 1 through stage 4 chronic kidney disease, or unspecified chronic kidney disease: Secondary | ICD-10-CM | POA: Diagnosis not present

## 2022-04-04 DIAGNOSIS — E1129 Type 2 diabetes mellitus with other diabetic kidney complication: Secondary | ICD-10-CM | POA: Diagnosis not present

## 2022-04-04 DIAGNOSIS — E785 Hyperlipidemia, unspecified: Secondary | ICD-10-CM | POA: Diagnosis not present

## 2022-04-07 DIAGNOSIS — Z20822 Contact with and (suspected) exposure to covid-19: Secondary | ICD-10-CM | POA: Diagnosis not present

## 2022-04-11 DIAGNOSIS — Z20822 Contact with and (suspected) exposure to covid-19: Secondary | ICD-10-CM | POA: Diagnosis not present

## 2022-04-26 ENCOUNTER — Encounter: Payer: Self-pay | Admitting: Cardiovascular Disease

## 2022-04-26 ENCOUNTER — Ambulatory Visit (INDEPENDENT_AMBULATORY_CARE_PROVIDER_SITE_OTHER): Payer: Medicare Other | Admitting: Cardiovascular Disease

## 2022-04-26 VITALS — BP 122/72 | HR 68 | Ht 64.0 in | Wt 163.8 lb

## 2022-04-26 DIAGNOSIS — I340 Nonrheumatic mitral (valve) insufficiency: Secondary | ICD-10-CM

## 2022-04-26 DIAGNOSIS — I10 Essential (primary) hypertension: Secondary | ICD-10-CM

## 2022-04-26 DIAGNOSIS — I491 Atrial premature depolarization: Secondary | ICD-10-CM

## 2022-04-26 NOTE — Patient Instructions (Signed)
Medication Instructions:  No changes *If you need a refill on your cardiac medications before your next appointment, please call your pharmacy*   Lab Work: none If you have labs (blood work) drawn today and your tests are completely normal, you will receive your results only by: MyChart Message (if you have MyChart) OR A paper copy in the mail If you have any lab test that is abnormal or we need to change your treatment, we will call you to review the results.   Testing/Procedures: none   Follow-Up: At [facility], you and your health needs are our priority.  As part of our continuing mission to provide you with exceptional heart care, we have created designated Provider Care Teams.  These Care Teams include your primary Cardiologist (physician) and Advanced Practice Providers (APPs -  Physician Assistants and Nurse Practitioners) who all work together to provide you with the care you need, when you need it.   Your next appointment:   12 month(s)  The format for your next appointment:   In Person  Provider:   Christopher McAlhany, MD     Important Information About Sugar       

## 2022-04-26 NOTE — Progress Notes (Signed)
Chief Complaint  Patient presents with   Follow-up    SVT   History of Present Illness: 76 yo female with history of SVT, DM, HTN, HLD and palpitations felt to be due to PACs who is here today for follow up. I saw her in 2012 for evaluation of palpitations and leg pain in the setting of steroid use for poison ivy treatment. I arranged a 48 Holter monitor and an echo. Her echo showed normal LV size and function. Her Holter monitor showed occasional premature atrial contractions but no VT or atrial fibrillation. I saw her in 2018 and she had c/o more frequent palpitations. Cardiac monitor April 2018 with PACs and several short runs of SVT. Echo August 2021 with LVEF=60-65%. Mild mitral regurgitation.  She is here today for follow up. The patient denies any chest pain, dyspnea, lower extremity edema, orthopnea, PND, dizziness, near syncope or syncope. She feels palpitations several times per month.   Primary Care Physician: Ginger Organ., MD  Past Medical History:  Diagnosis Date   Allergy    Anxiety    no meds    Cancer [facility])    mole    Diabetes mellitus without complication ([place])    Diverticulosis    Fibrocystic breast disease    Fundic gland polyps of stomach, benign 03/24/2015   GERD (gastroesophageal reflux disease)    Hx of colonic polyps 04/24/2017   Hyperlipidemia    Hypertension    IBS (irritable bowel syndrome)    Macular degeneration    vitreous hemorrhage, does injections in rt every 5 weeks   Obesity    Papilledema, both eyes 07/28/2018   Dr Lorrene Reid   Tachycardia, unspecified 04/08/2017   pt. states she saw Dr. Heloise Ochoa for this no meds no procedures, wore monitor   Urge incontinence     Past Surgical History:  Procedure Laterality Date   BREAST EXCISIONAL BIOPSY Left    over 15 years ( she thinks it may have been a cysts removal but there is a scar   breast lymph node removed     right   CHOLECYSTECTOMY     COLONOSCOPY  06/2002, 04/10/2007    diverticulosis, colon polyps, external hemorrhoids   KNEE ARTHROSCOPY     left   PARTIAL HYSTERECTOMY     POLYPECTOMY     SHOULDER SURGERY     right   UPPER GASTROINTESTINAL ENDOSCOPY  09/27/11   VIDEO BRONCHOSCOPY Bilateral 02/14/2015   Procedure: VIDEO BRONCHOSCOPY WITHOUT FLUORO;  Surgeon: Collene Gobble, MD;  Location: [facility];  Service: Cardiopulmonary;  Laterality: Bilateral;   WISDOM TOOTH EXTRACTION      Current Outpatient Medications  Medication Sig Dispense Refill   bisoprolol (ZEBETA) 10 MG tablet Take 10 mg by mouth daily.     cholecalciferol (VITAMIN D) 1000 units tablet Take 1,000 Units by mouth daily.     Coenzyme Q10 (COQ10) 100 MG CAPS Take 1 capsule by mouth daily.     dapagliflozin propanediol (FARXIGA) 10 MG TABS tablet Take 1 tablet by mouth daily.     fenofibrate 54 MG tablet Take 54 mg by mouth daily.     metFORMIN (GLUCOPHAGE-XR) 500 MG 24 hr tablet Take 500 mg by mouth 2 (two) times daily.     Multiple Vitamins-Minerals (PRESERVISION AREDS 2 PO) Take 1 tablet by mouth daily.     olmesartan (BENICAR) 40 MG tablet Take 1 tablet by mouth daily.     rosuvastatin (CRESTOR) 10 MG  tablet Patient takes 3 times a week.     vitamin B-12 (CYANOCOBALAMIN) 1000 MCG tablet Take 1,000 mcg by mouth daily.     No current facility-administered medications for this visit.    Allergies  Allergen Reactions   Janumet Xr [Sitagliptin-Metformin Hcl Er] Swelling   Tricor [Fenofibrate] Swelling   Zocor [Simvastatin] Swelling   Lovaza [Omega-3-Acid Ethyl Esters] Other (See Comments)    GI intolerance   Sulfa Antibiotics    Trimethoprim     Rash / hives that are painful   Elemental Sulfur Rash    Social History   Socioeconomic History   Marital status: Married    Spouse name: Not on file   Number of children: 2   Years of education: Not on file   Highest education level: Not on file  Occupational History    Employer: RETIRED  Tobacco Use   Smoking status: Never    Smokeless tobacco: Never  Vaping Use   Vaping Use: Never used  Substance and Sexual Activity   Alcohol use: No    Alcohol/week: 0.0 standard drinks   Drug use: No   Sexual activity: Yes    Birth control/protection: Post-menopausal  Other Topics Concern   Not on file  Social History Narrative   Has one great Merchandiser, retail, Oceanographer.  Lives home with husband.  Has 2 children.  Caffeine rare.     Enjoys gardening   Social Determinants of Radio broadcast assistant Strain: Not on file  Food Insecurity: Not on file  Transportation Needs: Not on file  Physical Activity: Not on file  Stress: Not on file  Social Connections: Not on file  Intimate Partner Violence: Not on file    Family History  Problem Relation Age of Onset   Diabetes Father    Heart failure Father    Hypertension Mother    Heart failure Mother    Diabetes Mother    Lung cancer Mother    Stroke Mother    Leukemia Other        grandfather   Cirrhosis Other        grandmother   Hypertension Sister    Colon cancer Maternal Uncle 28   Breast cancer Maternal Aunt    Colon polyps Neg Hx    Esophageal cancer Neg Hx    Rectal cancer Neg Hx    Stomach cancer Neg Hx     Review of Systems:  As stated in the HPI and otherwise negative.   BP 122/72   Pulse 68   Ht '5\' 4"'$  (1.626 m)   Wt 163 lb 12.8 oz (74.3 kg)   SpO2 95%   BMI 28.12 kg/m   Physical Examination: General: Well developed, well nourished, NAD  HEENT: OP clear, mucus membranes moist  SKIN: warm, dry. No rashes. Neuro: No focal deficits  Musculoskeletal: Muscle strength 5/5 all ext  Psychiatric: Mood and affect normal  Neck: No JVD, no carotid bruits, no thyromegaly, no lymphadenopathy.  Lungs:Clear bilaterally, no wheezes, rhonci, crackles Cardiovascular: Regular rate and rhythm. No murmurs, gallops or rubs. Abdomen:Soft. Bowel sounds present. Non-tender.  Extremities: No lower extremity edema. Pulses are 2 + in  the bilateral DP/PT.  Echo August 2021:  1. Left ventricular ejection fraction, by estimation, is 60 to 65%. The  left ventricle has normal function. The left ventricle has no regional  wall motion abnormalities. There is mild left ventricular hypertrophy of  the basal-septal segment. Left  ventricular diastolic  parameters are consistent with Grade I diastolic  dysfunction (impaired relaxation). Elevated left ventricular end-diastolic  pressure.   2. Right ventricular systolic function is normal. The right ventricular  size is normal. Tricuspid regurgitation signal is inadequate for assessing  PA pressure.   3. The mitral valve is normal in structure. Mild mitral valve  regurgitation. No evidence of mitral stenosis.   4. The aortic valve is tricuspid. Aortic valve regurgitation is not  visualized. Mild aortic valve sclerosis is present, with no evidence of  aortic valve stenosis.   5. The inferior vena cava is normal in size with greater than 50%  respiratory variability, suggesting right atrial pressure of 3 mmHg.   EKG:  EKG is ordered today. The ekg ordered today demonstrates NSR  Recent Labs: 02/13/2022: BUN 25; Creatinine, Ser 1.11; Potassium 4.3; Sodium 139    Wt Readings from Last 3 Encounters:  04/26/22 163 lb 12.8 oz (74.3 kg)  03/05/22 163 lb (73.9 kg)  01/12/22 165 lb (74.8 kg)    Other studies Reviewed: Additional studies/ records that were reviewed today include: . Review of the above records demonstrates:   Assessment and Plan:   1. PALPITATIONS/PAC/Paroxysmal SVT: Echo in 2021 with normal LV size and function. Rare palpitations. Continue bisoprolol.   2. HTN: BP is controlled.  3. Mitral regurgitation: Mild by echo in 2021. Repeat echo in 2024.    Current medicines are reviewed at length with the patient today.  The patient does not have concerns regarding medicines.  The following changes have been made:  no change  Labs/ tests ordered today include:    Orders Placed This Encounter  Procedures   EKG 12-Lead     Disposition:   FU with me in 12  months   Signed, Lauree Chandler, MD 04/26/2022 8:54 AM    [facility] [address], [place], [place]  [address] Phone: [phone]; Fax: [phone]

## 2022-05-02 DIAGNOSIS — H353211 Exudative age-related macular degeneration, right eye, with active choroidal neovascularization: Secondary | ICD-10-CM | POA: Diagnosis not present

## 2022-05-02 DIAGNOSIS — E113593 Type 2 diabetes mellitus with proliferative diabetic retinopathy without macular edema, bilateral: Secondary | ICD-10-CM | POA: Diagnosis not present

## 2022-05-02 DIAGNOSIS — H353124 Nonexudative age-related macular degeneration, left eye, advanced atrophic with subfoveal involvement: Secondary | ICD-10-CM | POA: Diagnosis not present

## 2022-05-02 DIAGNOSIS — H31093 Other chorioretinal scars, bilateral: Secondary | ICD-10-CM | POA: Diagnosis not present

## 2022-06-15 DIAGNOSIS — E113593 Type 2 diabetes mellitus with proliferative diabetic retinopathy without macular edema, bilateral: Secondary | ICD-10-CM | POA: Diagnosis not present

## 2022-06-15 DIAGNOSIS — H353211 Exudative age-related macular degeneration, right eye, with active choroidal neovascularization: Secondary | ICD-10-CM | POA: Diagnosis not present

## 2022-06-15 DIAGNOSIS — H31093 Other chorioretinal scars, bilateral: Secondary | ICD-10-CM | POA: Diagnosis not present

## 2022-06-15 DIAGNOSIS — H353124 Nonexudative age-related macular degeneration, left eye, advanced atrophic with subfoveal involvement: Secondary | ICD-10-CM | POA: Diagnosis not present

## 2022-06-28 DIAGNOSIS — L821 Other seborrheic keratosis: Secondary | ICD-10-CM | POA: Diagnosis not present

## 2022-06-28 DIAGNOSIS — L814 Other melanin hyperpigmentation: Secondary | ICD-10-CM | POA: Diagnosis not present

## 2022-06-28 DIAGNOSIS — D225 Melanocytic nevi of trunk: Secondary | ICD-10-CM | POA: Diagnosis not present

## 2022-07-12 ENCOUNTER — Telehealth: Payer: Self-pay | Admitting: Internal Medicine

## 2022-07-12 DIAGNOSIS — N1832 Chronic kidney disease, stage 3b: Secondary | ICD-10-CM | POA: Diagnosis not present

## 2022-07-12 DIAGNOSIS — I129 Hypertensive chronic kidney disease with stage 1 through stage 4 chronic kidney disease, or unspecified chronic kidney disease: Secondary | ICD-10-CM | POA: Diagnosis not present

## 2022-07-12 DIAGNOSIS — R109 Unspecified abdominal pain: Secondary | ICD-10-CM | POA: Diagnosis not present

## 2022-07-12 DIAGNOSIS — E785 Hyperlipidemia, unspecified: Secondary | ICD-10-CM | POA: Diagnosis not present

## 2022-07-12 DIAGNOSIS — E1129 Type 2 diabetes mellitus with other diabetic kidney complication: Secondary | ICD-10-CM | POA: Diagnosis not present

## 2022-07-12 NOTE — Telephone Encounter (Signed)
Pt states that she has had soreness  in her right abdomen and right flank for about a month now. Pt states that she had an infection several months ago and was treating with antibiotics and questions if she needs more antibiotics: Pt chart reviewed: Pt was treated in February for  diverticulitis:  Pt states that her BM are normal, No N/V, nothing that she eats makes it better or worse: Pt states that she feels the soreness when she turns a certain way or if she hits a bump going down the road. Pt states that even laying in bed if she throws her leg over a certain way that she can feel the soreness:  Pt was notified that this does sound more muscle skeletal  in nature as if she might have pulled a muscle: Pt did state that she has been working in her flower garden: Pt was notified that I will run this by Dr. Carlean Purl to see if he advise any different: Please advise

## 2022-07-12 NOTE — Telephone Encounter (Signed)
Agree - sounds musculoskeletal  Given the history let us repeat a CT scan abdomen and pelvis with contrast  Dx: diverticulitis on prior CT scan, LLQ pain  Evaluate for resolution

## 2022-07-12 NOTE — Telephone Encounter (Signed)
Patient called requested a call back from a nurse to discuss getting antibiotics.

## 2022-07-13 ENCOUNTER — Other Ambulatory Visit: Payer: Self-pay

## 2022-07-13 DIAGNOSIS — K5732 Diverticulitis of large intestine without perforation or abscess without bleeding: Secondary | ICD-10-CM

## 2022-07-13 DIAGNOSIS — R1032 Left lower quadrant pain: Secondary | ICD-10-CM

## 2022-07-13 NOTE — Telephone Encounter (Signed)
Pt made aware Dr. Carlean Purl recommendations: Pt was scheduled for a CT scan on 07/17/2022 at 5:30 PM at [facility]: Pt to arrive at 5:00 PM: Nothing to eat or drink 4 hours prior. Pt to pick up prep from [facility] prior to the date of the CT Scan: Pt made aware Pt verbalized understanding with all questions answered.

## 2022-07-17 ENCOUNTER — Ambulatory Visit (HOSPITAL_COMMUNITY)
Admission: RE | Admit: 2022-07-17 | Discharge: 2022-07-17 | Disposition: A | Discharge status: Home / Self Care | Payer: Medicare Other | Source: Ambulatory Visit | Attending: Internal Medicine | Admitting: Internal Medicine

## 2022-07-17 DIAGNOSIS — K5732 Diverticulitis of large intestine without perforation or abscess without bleeding: Secondary | ICD-10-CM | POA: Insufficient documentation

## 2022-07-17 DIAGNOSIS — R1032 Left lower quadrant pain: Secondary | ICD-10-CM | POA: Insufficient documentation

## 2022-07-17 DIAGNOSIS — N281 Cyst of kidney, acquired: Secondary | ICD-10-CM | POA: Diagnosis not present

## 2022-07-17 DIAGNOSIS — K5730 Diverticulosis of large intestine without perforation or abscess without bleeding: Secondary | ICD-10-CM | POA: Diagnosis not present

## 2022-07-17 MED ORDER — IOHEXOL 9 MG/ML PO SOLN: 1000.0000 mL | Freq: Once | ORAL | Status: DC

## 2022-07-17 MED ORDER — IOHEXOL 300 MG/ML  SOLN
100.0000 mL | Freq: Once | INTRAMUSCULAR | Status: AC | PRN
  Administered 2022-07-17: 100 mL via INTRAVENOUS

## 2022-07-25 DIAGNOSIS — H353211 Exudative age-related macular degeneration, right eye, with active choroidal neovascularization: Secondary | ICD-10-CM | POA: Diagnosis not present

## 2022-07-25 DIAGNOSIS — H353124 Nonexudative age-related macular degeneration, left eye, advanced atrophic with subfoveal involvement: Secondary | ICD-10-CM | POA: Diagnosis not present

## 2022-07-25 DIAGNOSIS — H31093 Other chorioretinal scars, bilateral: Secondary | ICD-10-CM | POA: Diagnosis not present

## 2022-07-25 DIAGNOSIS — E113593 Type 2 diabetes mellitus with proliferative diabetic retinopathy without macular edema, bilateral: Secondary | ICD-10-CM | POA: Diagnosis not present

## 2022-07-31 DIAGNOSIS — H353132 Nonexudative age-related macular degeneration, bilateral, intermediate dry stage: Secondary | ICD-10-CM | POA: Diagnosis not present

## 2022-08-14 ENCOUNTER — Ambulatory Visit: Payer: Medicare Other | Admitting: Nurse Practitioner

## 2022-08-22 DIAGNOSIS — E785 Hyperlipidemia, unspecified: Secondary | ICD-10-CM | POA: Diagnosis not present

## 2022-08-22 DIAGNOSIS — I10 Essential (primary) hypertension: Secondary | ICD-10-CM | POA: Diagnosis not present

## 2022-08-22 DIAGNOSIS — E1129 Type 2 diabetes mellitus with other diabetic kidney complication: Secondary | ICD-10-CM | POA: Diagnosis not present

## 2022-08-22 DIAGNOSIS — N1832 Chronic kidney disease, stage 3b: Secondary | ICD-10-CM | POA: Diagnosis not present

## 2022-08-22 DIAGNOSIS — R7989 Other specified abnormal findings of blood chemistry: Secondary | ICD-10-CM | POA: Diagnosis not present

## 2022-08-27 ENCOUNTER — Other Ambulatory Visit: Payer: Self-pay | Admitting: Internal Medicine

## 2022-08-27 DIAGNOSIS — N3020 Other chronic cystitis without hematuria: Secondary | ICD-10-CM | POA: Diagnosis not present

## 2022-08-27 DIAGNOSIS — R8271 Bacteriuria: Secondary | ICD-10-CM | POA: Diagnosis not present

## 2022-08-27 DIAGNOSIS — Z1231 Encounter for screening mammogram for malignant neoplasm of breast: Secondary | ICD-10-CM

## 2022-08-29 DIAGNOSIS — E1139 Type 2 diabetes mellitus with other diabetic ophthalmic complication: Secondary | ICD-10-CM | POA: Diagnosis not present

## 2022-08-29 DIAGNOSIS — M8580 Other specified disorders of bone density and structure, unspecified site: Secondary | ICD-10-CM | POA: Diagnosis not present

## 2022-08-29 DIAGNOSIS — E785 Hyperlipidemia, unspecified: Secondary | ICD-10-CM | POA: Diagnosis not present

## 2022-08-29 DIAGNOSIS — Z1331 Encounter for screening for depression: Secondary | ICD-10-CM | POA: Diagnosis not present

## 2022-08-29 DIAGNOSIS — I700 Atherosclerosis of aorta: Secondary | ICD-10-CM | POA: Diagnosis not present

## 2022-08-29 DIAGNOSIS — N1832 Chronic kidney disease, stage 3b: Secondary | ICD-10-CM | POA: Diagnosis not present

## 2022-08-29 DIAGNOSIS — E1129 Type 2 diabetes mellitus with other diabetic kidney complication: Secondary | ICD-10-CM | POA: Diagnosis not present

## 2022-08-29 DIAGNOSIS — I129 Hypertensive chronic kidney disease with stage 1 through stage 4 chronic kidney disease, or unspecified chronic kidney disease: Secondary | ICD-10-CM | POA: Diagnosis not present

## 2022-08-29 DIAGNOSIS — Z0000 Encounter for general adult medical examination without abnormal findings: Secondary | ICD-10-CM | POA: Diagnosis not present

## 2022-08-29 DIAGNOSIS — Z1339 Encounter for screening examination for other mental health and behavioral disorders: Secondary | ICD-10-CM | POA: Diagnosis not present

## 2022-08-29 DIAGNOSIS — R82998 Other abnormal findings in urine: Secondary | ICD-10-CM | POA: Diagnosis not present

## 2022-08-29 DIAGNOSIS — Z23 Encounter for immunization: Secondary | ICD-10-CM | POA: Diagnosis not present

## 2022-09-05 DIAGNOSIS — E113593 Type 2 diabetes mellitus with proliferative diabetic retinopathy without macular edema, bilateral: Secondary | ICD-10-CM | POA: Diagnosis not present

## 2022-09-05 DIAGNOSIS — H353124 Nonexudative age-related macular degeneration, left eye, advanced atrophic with subfoveal involvement: Secondary | ICD-10-CM | POA: Diagnosis not present

## 2022-09-05 DIAGNOSIS — H31093 Other chorioretinal scars, bilateral: Secondary | ICD-10-CM | POA: Diagnosis not present

## 2022-09-05 DIAGNOSIS — H353211 Exudative age-related macular degeneration, right eye, with active choroidal neovascularization: Secondary | ICD-10-CM | POA: Diagnosis not present

## 2022-09-19 ENCOUNTER — Ambulatory Visit: Payer: Medicare Other

## 2022-09-28 ENCOUNTER — Ambulatory Visit: Payer: Medicare Other

## 2022-10-02 ENCOUNTER — Ambulatory Visit: Payer: Medicare Other

## 2022-10-23 DIAGNOSIS — N1832 Chronic kidney disease, stage 3b: Secondary | ICD-10-CM | POA: Diagnosis not present

## 2022-10-23 DIAGNOSIS — I129 Hypertensive chronic kidney disease with stage 1 through stage 4 chronic kidney disease, or unspecified chronic kidney disease: Secondary | ICD-10-CM | POA: Diagnosis not present

## 2022-10-23 DIAGNOSIS — G720 Drug-induced myopathy: Secondary | ICD-10-CM | POA: Diagnosis not present

## 2022-10-23 DIAGNOSIS — E785 Hyperlipidemia, unspecified: Secondary | ICD-10-CM | POA: Diagnosis not present

## 2022-10-23 DIAGNOSIS — E1129 Type 2 diabetes mellitus with other diabetic kidney complication: Secondary | ICD-10-CM | POA: Diagnosis not present

## 2022-10-24 DIAGNOSIS — H353124 Nonexudative age-related macular degeneration, left eye, advanced atrophic with subfoveal involvement: Secondary | ICD-10-CM | POA: Diagnosis not present

## 2022-10-24 DIAGNOSIS — H353211 Exudative age-related macular degeneration, right eye, with active choroidal neovascularization: Secondary | ICD-10-CM | POA: Diagnosis not present

## 2022-10-24 DIAGNOSIS — H31093 Other chorioretinal scars, bilateral: Secondary | ICD-10-CM | POA: Diagnosis not present

## 2022-10-24 DIAGNOSIS — E113593 Type 2 diabetes mellitus with proliferative diabetic retinopathy without macular edema, bilateral: Secondary | ICD-10-CM | POA: Diagnosis not present

## 2022-11-06 ENCOUNTER — Ambulatory Visit
Admission: RE | Admit: 2022-11-06 | Discharge: 2022-11-06 | Disposition: A | Discharge status: Home / Self Care | Payer: Medicare Other | Source: Ambulatory Visit | Attending: Internal Medicine | Admitting: Internal Medicine

## 2022-11-06 DIAGNOSIS — Z1231 Encounter for screening mammogram for malignant neoplasm of breast: Secondary | ICD-10-CM

## 2022-12-12 DIAGNOSIS — N3020 Other chronic cystitis without hematuria: Secondary | ICD-10-CM | POA: Diagnosis not present

## 2022-12-13 ENCOUNTER — Telehealth: Payer: Self-pay | Admitting: Internal Medicine

## 2022-12-13 NOTE — Telephone Encounter (Signed)
PT is having a diverticulitis flare. She's taking amoxicillin but it isnt helping. Please advise.

## 2022-12-13 NOTE — Telephone Encounter (Signed)
Pt stated that she had been prescribed Amoxicillin for [facility] who she went to thinking that she had a bladder infection:  Pt stated that her urine results were negative although the Provider there prescribed her the Amoxicillin to take for 7 days twice daily for possible diverticulitis  Pt notified to contact our office if her symptoms have not improved at the end of her 7 day coarse: Pt verbalized understanding with all questions answered.

## 2022-12-14 DIAGNOSIS — H353123 Nonexudative age-related macular degeneration, left eye, advanced atrophic without subfoveal involvement: Secondary | ICD-10-CM | POA: Diagnosis not present

## 2022-12-14 DIAGNOSIS — H353211 Exudative age-related macular degeneration, right eye, with active choroidal neovascularization: Secondary | ICD-10-CM | POA: Diagnosis not present

## 2022-12-14 DIAGNOSIS — E113593 Type 2 diabetes mellitus with proliferative diabetic retinopathy without macular edema, bilateral: Secondary | ICD-10-CM | POA: Diagnosis not present

## 2022-12-27 DIAGNOSIS — R112 Nausea with vomiting, unspecified: Secondary | ICD-10-CM | POA: Diagnosis not present

## 2022-12-27 DIAGNOSIS — R5383 Other fatigue: Secondary | ICD-10-CM | POA: Diagnosis not present

## 2022-12-27 DIAGNOSIS — Z1152 Encounter for screening for COVID-19: Secondary | ICD-10-CM | POA: Diagnosis not present

## 2022-12-27 DIAGNOSIS — R42 Dizziness and giddiness: Secondary | ICD-10-CM | POA: Diagnosis not present

## 2022-12-27 DIAGNOSIS — R300 Dysuria: Secondary | ICD-10-CM | POA: Diagnosis not present

## 2022-12-27 DIAGNOSIS — R0981 Nasal congestion: Secondary | ICD-10-CM | POA: Diagnosis not present

## 2022-12-27 DIAGNOSIS — R509 Fever, unspecified: Secondary | ICD-10-CM | POA: Diagnosis not present

## 2023-01-02 DIAGNOSIS — E1129 Type 2 diabetes mellitus with other diabetic kidney complication: Secondary | ICD-10-CM | POA: Diagnosis not present

## 2023-01-02 DIAGNOSIS — E785 Hyperlipidemia, unspecified: Secondary | ICD-10-CM | POA: Diagnosis not present

## 2023-01-02 DIAGNOSIS — M8580 Other specified disorders of bone density and structure, unspecified site: Secondary | ICD-10-CM | POA: Diagnosis not present

## 2023-01-02 DIAGNOSIS — E781 Pure hyperglyceridemia: Secondary | ICD-10-CM | POA: Diagnosis not present

## 2023-01-02 DIAGNOSIS — I700 Atherosclerosis of aorta: Secondary | ICD-10-CM | POA: Diagnosis not present

## 2023-01-02 DIAGNOSIS — G629 Polyneuropathy, unspecified: Secondary | ICD-10-CM | POA: Diagnosis not present

## 2023-01-02 DIAGNOSIS — N1832 Chronic kidney disease, stage 3b: Secondary | ICD-10-CM | POA: Diagnosis not present

## 2023-01-02 DIAGNOSIS — H353210 Exudative age-related macular degeneration, right eye, stage unspecified: Secondary | ICD-10-CM | POA: Diagnosis not present

## 2023-01-02 DIAGNOSIS — K529 Noninfective gastroenteritis and colitis, unspecified: Secondary | ICD-10-CM | POA: Diagnosis not present

## 2023-01-02 DIAGNOSIS — G932 Benign intracranial hypertension: Secondary | ICD-10-CM | POA: Diagnosis not present

## 2023-01-02 DIAGNOSIS — I129 Hypertensive chronic kidney disease with stage 1 through stage 4 chronic kidney disease, or unspecified chronic kidney disease: Secondary | ICD-10-CM | POA: Diagnosis not present

## 2023-01-30 DIAGNOSIS — E785 Hyperlipidemia, unspecified: Secondary | ICD-10-CM | POA: Diagnosis not present

## 2023-01-30 DIAGNOSIS — I129 Hypertensive chronic kidney disease with stage 1 through stage 4 chronic kidney disease, or unspecified chronic kidney disease: Secondary | ICD-10-CM | POA: Diagnosis not present

## 2023-01-30 DIAGNOSIS — E1129 Type 2 diabetes mellitus with other diabetic kidney complication: Secondary | ICD-10-CM | POA: Diagnosis not present

## 2023-01-30 DIAGNOSIS — N1832 Chronic kidney disease, stage 3b: Secondary | ICD-10-CM | POA: Diagnosis not present

## 2023-02-01 DIAGNOSIS — H353211 Exudative age-related macular degeneration, right eye, with active choroidal neovascularization: Secondary | ICD-10-CM | POA: Diagnosis not present

## 2023-02-01 DIAGNOSIS — H353123 Nonexudative age-related macular degeneration, left eye, advanced atrophic without subfoveal involvement: Secondary | ICD-10-CM | POA: Diagnosis not present

## 2023-02-01 DIAGNOSIS — E113593 Type 2 diabetes mellitus with proliferative diabetic retinopathy without macular edema, bilateral: Secondary | ICD-10-CM | POA: Diagnosis not present

## 2023-02-07 ENCOUNTER — Telehealth: Payer: Self-pay | Admitting: Internal Medicine

## 2023-02-07 NOTE — Telephone Encounter (Signed)
Pt made aware of Dr. Carlean Purl recommendations: Pt was scheduled for 02/08/2023 at 2:30 with Dr. Carlean Purl. Pt made aware. Pt verbalized understanding with all questions answered.

## 2023-02-07 NOTE — Telephone Encounter (Signed)
PT is calling about diverticulitis flare. She is looking for options for relief. Please advise.

## 2023-02-07 NOTE — Telephone Encounter (Signed)
Pt stated that she recently finished antibiotics a week ago and still has a little throbbing on her left abdomen at times. Pt states that when she has the throbbing then shortly thereafter she has a large liquid BM. Pt states its not an every day thing. Pt stated that she had a BM earlier this AM that was formed and sometime later on she starting having the left abdomen throbbing and had a large liquid BM: Please advise

## 2023-02-07 NOTE — Telephone Encounter (Signed)
Have her come see me at 850 tomorrow (open spot)

## 2023-02-08 ENCOUNTER — Encounter: Payer: Self-pay | Admitting: Internal Medicine

## 2023-02-08 ENCOUNTER — Ambulatory Visit (INDEPENDENT_AMBULATORY_CARE_PROVIDER_SITE_OTHER): Payer: Medicare Other | Admitting: Internal Medicine

## 2023-02-08 VITALS — BP 130/78 | HR 90 | Ht 64.0 in | Wt 163.2 lb

## 2023-02-08 DIAGNOSIS — K5732 Diverticulitis of large intestine without perforation or abscess without bleeding: Secondary | ICD-10-CM | POA: Diagnosis not present

## 2023-02-08 DIAGNOSIS — R3915 Urgency of urination: Secondary | ICD-10-CM

## 2023-02-08 DIAGNOSIS — R152 Fecal urgency: Secondary | ICD-10-CM

## 2023-02-08 DIAGNOSIS — R1032 Left lower quadrant pain: Secondary | ICD-10-CM

## 2023-02-08 DIAGNOSIS — K580 Irritable bowel syndrome with diarrhea: Secondary | ICD-10-CM | POA: Diagnosis not present

## 2023-02-08 MED ORDER — AMOXICILLIN-POT CLAVULANATE 875-125 MG PO TABS: 1.0000 | ORAL_TABLET | Freq: Two times a day (BID) | ORAL | 0 refills | Status: AC

## 2023-02-08 NOTE — Progress Notes (Signed)
Frances Brown 77 y.o. 1946/02/27 [id number]  Assessment & Plan:   Encounter Diagnoses  Name Primary?   Irritable bowel syndrome with diarrhea Yes   Diverticulitis of colon    Left lower quadrant abdominal pain    Fecal urgency    Urinary urgency     I do not think she is suffering from diverticulitis at this time necessarily, it is always been difficult to know but she does respond to antibiotics when the pain is severe so I will give her some to keep on hand like we have done in the past.  I want her to keep a food diary to see what may be triggering her episodes of diarrhea and given the fecal and urinary urgency we will refer to physical therapy for pelvic floor treatment.  She reports a history of being told she has a cystocele and I am suspicious of pelvic floor weakness.  She will follow-up here as needed otherwise. Meds ordered this encounter  Medications   amoxicillin-clavulanate (AUGMENTIN) 875-125 MG tablet    Sig: Take 1 tablet by mouth 2 (two) times daily for 7 days. When diverticulitis flares    Dispense:  14 tablet    Refill:  0   CC: Ginger Organ., MD    Subjective:   Chief Complaint: Abdominal pain and diarrhea  HPI 77 year old white woman followed here with a history of diverticulitis suspicion and IBS problems.  She keeps Augmentin generic on hand because of episodes of left lower quadrant pain that we think are diverticulitis though have not always proven to be so.  She recently had a spell and took her Augmentin and that is better though she still a little sore there.  She has had episodic watery stools off and on for a number of years at times she has thought that is been related to her diverticulitis.  She has had her metformin increased from 500 twice daily to 507 50.  She wonders if that has caused an uptick in her diarrhea though she is really not sure.  She cannot identify clear triggers.  She does not use significant artificial sweeteners.  Many  days she has normal bowel movements but typically she will have a normal 1 on a day with problems and then later have a watery stool.  She does have a history of being told she had a "dropped bladder".  She is status post partial hysterectomy does not follow-up with every 3 years in gynecology and not sure when she was seen last.  She does not get constipated.  Stools and urine can be urgent and she worries about incontinence but has not had that because she mostly stays home.  Colonoscopy: 06/02/20 - Four diminutive polyps in the sigmoid colon, in the transverse colon and in the ascending colon, removed with a cold snare. Resected and retrieved.  Diminutive SSP- Diverticulosis in the sigmoid colon and in the descending colon. - The examination was otherwise normal on direct and retroflexion views. - Personal history of colonic polyps 5 and 10 mm ssp/a's 2018. I   Allergies  Allergen Reactions   Janumet Xr [Sitagliptin-Metformin Hcl Er] Swelling   Tricor [Fenofibrate] Swelling   Zocor [Simvastatin] Swelling   Lovaza [Omega-3-Acid Ethyl Esters] Other (See Comments)    GI intolerance   Sulfa Antibiotics    Trimethoprim     Rash / hives that are painful   Elemental Sulfur Rash   Current Meds  Medication Sig   amoxicillin-clavulanate (  AUGMENTIN) 875-125 MG tablet Take 1 tablet by mouth 2 (two) times daily for 7 days. When diverticulitis flares   bisoprolol (ZEBETA) 10 MG tablet Take 10 mg by mouth daily.   cholecalciferol (VITAMIN D) 1000 units tablet Take 1,000 Units by mouth daily.   Coenzyme Q10 (COQ10) 100 MG CAPS Take 1 capsule by mouth daily.   fenofibrate 54 MG tablet Take 54 mg by mouth daily.   metFORMIN (GLUCOPHAGE-XR) 500 MG 24 hr tablet Take 500 mg by mouth daily and 750 mg w/ supper   Multiple Vitamins-Minerals (PRESERVISION AREDS 2 PO) Take 1 tablet by mouth daily.   olmesartan (BENICAR) 40 MG tablet Take 1 tablet by mouth daily.   rosuvastatin (CRESTOR) 10 MG tablet Patient  takes 3 times a week.   vitamin B-12 (CYANOCOBALAMIN) 1000 MCG tablet Take 1,000 mcg by mouth daily.   Past Medical History:  Diagnosis Date   Allergy    Anxiety    no meds    Cancer ([place])    mole    Diabetes mellitus without complication ([place])    Diverticulosis    Fibrocystic breast disease    Fundic gland polyps of stomach, benign 03/24/2015   GERD (gastroesophageal reflux disease)    Hx of colonic polyps 04/24/2017   Hyperlipidemia    Hypertension    IBS (irritable bowel syndrome)    Macular degeneration    vitreous hemorrhage, does injections in rt every 5 weeks   Obesity    Papilledema, both eyes 07/28/2018   Dr Lorrene Reid   Tachycardia, unspecified 04/08/2017   pt. states she saw Dr. Heloise Ochoa for this no meds no procedures, wore monitor   Urge incontinence    Past Surgical History:  Procedure Laterality Date   BREAST EXCISIONAL BIOPSY Left    over 15 years ( she thinks it may have been a cysts removal but there is a scar   breast lymph node removed     right   CHOLECYSTECTOMY     COLONOSCOPY  06/2002, 04/10/2007   diverticulosis, colon polyps, external hemorrhoids   KNEE ARTHROSCOPY     left   PARTIAL HYSTERECTOMY     POLYPECTOMY     SHOULDER SURGERY     right   UPPER GASTROINTESTINAL ENDOSCOPY  09/27/11   VIDEO BRONCHOSCOPY Bilateral 02/14/2015   Procedure: VIDEO BRONCHOSCOPY WITHOUT FLUORO;  Surgeon: Collene Gobble, MD;  Location: [facility];  Service: Cardiopulmonary;  Laterality: Bilateral;   WISDOM TOOTH EXTRACTION     Social History   Social History Narrative   Has one great Merchandiser, retail, Oceanographer.  Lives home with husband.  Has 2 children.  Caffeine rare.     Enjoys gardening   family history includes Breast cancer in her maternal aunt; Cirrhosis in an other family member; Colon cancer (age of onset: 89) in her maternal uncle; Diabetes in her father and mother; Heart failure in her father and mother; Hypertension in  her mother and sister; Leukemia in an other family member; Lung cancer in her mother; Stroke in her mother.   Review of Systems As per HPI  Objective:   Physical Exam BP 130/78   Pulse 90   Ht '5\' 4"'$  (1.626 m)   Wt 163 lb 3.2 oz (74 kg)   SpO2 98%   BMI 28.01 kg/m  Sl LLQ tenderness o/w benign abdomen Negative Carnett's

## 2023-02-08 NOTE — Patient Instructions (Signed)
We have sent the following medications to your pharmacy for you to pick up at your convenience: Augmentin ( to have on hand, call if you have to use it)  We have placed a referral for pelvic floor physical therapy. They will contact you about setting up an appointment.  Keep a food diary.  I appreciate the opportunity to care for you. Silvano Rusk, MD, [facility]

## 2023-02-11 NOTE — Therapy (Signed)
OUTPATIENT PHYSICAL THERAPY FEMALE PELVIC EVALUATION   Patient Name: Frances Brown MRN: [id number] DOB:1945-12-25, 77 y.o., female Today's Date: 02/12/2023  END OF SESSION:  PT End of Session - 02/12/23 0948     Visit Number 1    Date for PT Re-Evaluation 05/07/23    Authorization Type Medicare    PT Start Time 0930    PT Stop Time 1015    PT Time Calculation (min) 45 min    Activity Tolerance Patient tolerated treatment well    Behavior During Therapy WFL for tasks assessed/performed             Past Medical History:  Diagnosis Date   Allergy    Anxiety    no meds    Cancer ([place])    mole    Diabetes mellitus without complication ([place])    Diverticulosis    Fibrocystic breast disease    Fundic gland polyps of stomach, benign 03/24/2015   GERD (gastroesophageal reflux disease)    Hx of colonic polyps 04/24/2017   Hyperlipidemia    Hypertension    IBS (irritable bowel syndrome)    Macular degeneration    vitreous hemorrhage, does injections in rt every 5 weeks   Obesity    Papilledema, both eyes 07/28/2018   Dr Lorrene Reid   Tachycardia, unspecified 04/08/2017   pt. states she saw Dr. Heloise Ochoa for this no meds no procedures, wore monitor   Urge incontinence    Past Surgical History:  Procedure Laterality Date   BREAST EXCISIONAL BIOPSY Left    over 15 years ( she thinks it may have been a cysts removal but there is a scar   breast lymph node removed     right   CHOLECYSTECTOMY     COLONOSCOPY  06/2002, 04/10/2007   diverticulosis, colon polyps, external hemorrhoids   KNEE ARTHROSCOPY     left   PARTIAL HYSTERECTOMY     POLYPECTOMY     SHOULDER SURGERY     right   UPPER GASTROINTESTINAL ENDOSCOPY  09/27/11   VIDEO BRONCHOSCOPY Bilateral 02/14/2015   Procedure: VIDEO BRONCHOSCOPY WITHOUT FLUORO;  Surgeon: Collene Gobble, MD;  Location: [facility];  Service: Cardiopulmonary;  Laterality: Bilateral;   WISDOM TOOTH EXTRACTION     Patient Active  Problem List   Diagnosis Date Noted   Pain in left hip 09/19/2021   Pain in right leg 07/15/2019   Gastroesophageal reflux disease 07/14/2018   Abdominal pain, left lower quadrant 07/09/2017   Hoarseness 03/11/2015   Cough 01/28/2014   HTN (hypertension) 12/27/2011   Hyperlipidemia 12/27/2011   Obesity 08/16/2011   IBS (irritable bowel syndrome) 05/23/2011   PALPITATIONS 12/21/2010   Chronic left upper quadrant pain 08/08/2010    PCP: Marton Redwood, MD  REFERRING PROVIDER:   Gatha Mayer, MD    REFERRING DIAG:  R15.2 (ICD-10-CM) - Fecal urgency  R39.15 (ICD-10-CM) - Urinary urgency    THERAPY DIAG:  No diagnosis found.  Rationale for Evaluation and Treatment: Rehabilitation  ONSET DATE: bladder infections increasing for 5 years; partial hysterectomy in my 39s and bladder has been dropped since that time  SUBJECTIVE:  SUBJECTIVE STATEMENT: Pt reports I have a dropped bladder and urgent calls to the bathroom.  Pt has been to [facility] thinking there was a bladder infection but it was the diverticulitis.  I had watery stool and Dr. Carlean Purl though maybe sensitivity. Fluid intake: Yes: try to drink water and with meals but maybe not enough    PAIN:  Are you having pain? No   PRECAUTIONS: None  WEIGHT BEARING RESTRICTIONS: No  FALLS:  Has patient fallen in last 6 months? No  LIVING ENVIRONMENT: Lives with: lives with their spouse Lives in: House/apartment   OCCUPATION:   PLOF: Independent  PATIENT GOALS: strengthen pelvic floor and see if it helps not have as many infections  PERTINENT HISTORY:  Partial hysterectomy, left knee surgery, IBS, diverticulosis, bladder tacked Sexual abuse: No  BOWEL MOVEMENT: Pain with bowel movement: Yes Type of bowel movement:Type  (Bristol Stool Scale) can be watery, Frequency daily up to 3x, and Strain No Fully empty rectum: Yes:   Leakage: No Pads: No Fiber supplement: No  URINATION: Pain with urination: Yes Fully empty bladder: No Stream:  sometimes weak Urgency: Yes:   Frequency: have to go right after drinking sometimes; nocturia 1-2/night depends on medication or if having an infection Leakage:  no Pads: No  INTERCOURSE: Pain with intercourse:  at times   PREGNANCY: Vaginal deliveries 2   PROLAPSE: Cystocele bulging out   OBJECTIVE:   DIAGNOSTIC FINDINGS:    PATIENT SURVEYS:    PFIQ-7   COGNITION: Overall cognitive status: Within functional limits for tasks assessed     SENSATION: Light touch:  Proprioception:   MUSCLE LENGTH: Hamstrings: Right 90 deg; Left 85 deg Thomas test:   LUMBAR SPECIAL TESTS:  Straight leg raise test: Negative  FUNCTIONAL TESTS:  Single leg stand normal  GAIT:  Comments: WFL   POSTURE: forward head and increased thoracic kyphosis  PELVIC ALIGNMENT: left anterior rotation  LUMBARAROM/PROM:  A/PROM A/PROM  eval  Flexion 80%  Extension   Right lateral flexion 80%  Left lateral flexion 100%  Right rotation   Left rotation    (Blank rows = not tested)  LOWER EXTREMITY ROM:  Passive ROM Right eval Left eval  Hip flexion [facility]   Hip extension    Hip abduction    Hip adduction    Hip internal rotation 50% 50%  Hip external rotation 75% 80%  Knee flexion    Knee extension    Ankle dorsiflexion    Ankle plantarflexion    Ankle inversion    Ankle eversion     (Blank rows = not tested)  LOWER EXTREMITY MMT: bilateral hip strength 4/5  MMT Right eval Left eval  Hip flexion    Hip extension    Hip abduction    Hip adduction    Hip internal rotation    Hip external rotation    Knee flexion    Knee extension    Ankle dorsiflexion    Ankle plantarflexion    Ankle inversion    Ankle eversion     PALPATION:   General   tight lumbar paraspinals                External Perineal Exam palpation external to clothing to assess how they do a kegel - holding breathing and co-contracting - can do 2 reps and then not relaxing  Internal Pelvic Floor NA - deferred due to pt request  Patient confirms identification and approves PT to assess internal pelvic floor and treatment No  PELVIC MMT:   MMT eval  Vaginal   Internal Anal Sphincter   External Anal Sphincter   Puborectalis   Diastasis Recti   (Blank rows = not tested)        TONE: NA  PROLAPSE: Bladder per patient  TODAY'S TREATMENT:                                                                                                                              DATE: 02/12/23  EVAL and bowel diary, toileting, prolapse handout   PATIENT EDUCATION:  Education details: bowel diary, toileting, prolapse handout Person educated: Patient Education method: Explanation, Verbal cues, and Handouts Education comprehension: verbalized understanding  HOME EXERCISE PROGRAM: Not issued today  ASSESSMENT:  CLINICAL IMPRESSION: Patient is a 77 y.o. female who was seen today for physical therapy evaluation and treatment for urgency and bladder prolapse. Pt having bowel and bladder urgency and can feel the bladder bulging.  Pt is getting a lot of infections and trying to avoid surgery.  Pt was not interested in getting pelvic floor assessed via an internal soft tissue assessment today so everything assess externally with palpation only.  Of note, pt has core and hip weakness.  She has pelvic obliquity.  Pt has difficulty with coordination and using breath holding and co-contracting muscles when attempting to do kegel.  Pt will benefit from skilled PT to address these impairments for improved prolapse and bladder control and overall function.  OBJECTIVE IMPAIRMENTS: decreased coordination, decreased endurance, decreased ROM, decreased strength,  increased muscle spasms, impaired flexibility, postural dysfunction, and pain.   ACTIVITY LIMITATIONS: lifting, standing, continence, and toileting  PARTICIPATION LIMITATIONS: interpersonal relationship and community activity  PERSONAL FACTORS: 3+ comorbidities: Partial hysterectomy, left knee surgery, IBS, diverticulosis, bladder tacked  are also affecting patient's functional outcome.   REHAB POTENTIAL: Excellent  CLINICAL DECISION MAKING: Evolving/moderate complexity  EVALUATION COMPLEXITY: Moderate   GOALS: Goals reviewed with patient? Yes  SHORT TERM GOALS: Target date: 03/12/23  Ind with initial HEP Baseline: Goal status: INITIAL  2.  Ind with toileting technique Baseline:  Goal status: INITIAL    LONG TERM GOALS: Target date: 05/07/23  Pt will be independent with advanced HEP to maintain improvements made throughout therapy  Baseline:  Goal status: INITIAL  2.  Pt will have 50% less urgency due to bowel/bladder retraining and strengthening  Baseline:  Goal status: INITIAL  3.  Pt will demonstrate 4+/5 bilateral hip strength for improved functional activities Baseline:  Goal status: INITIAL  4.  Pt will have at least 50% less discomfort due to bladder prolapse Baseline:  Goal status: INITIAL    PLAN:  PT FREQUENCY: 1x/week  PT DURATION: 12 weeks  PLANNED INTERVENTIONS: Therapeutic exercises, Therapeutic activity, Neuromuscular re-education, Balance training, Gait training, Patient/Family education, Self Care, Joint  mobilization, Dry Needling, Electrical stimulation, Cryotherapy, Moist heat, Taping, Biofeedback, Manual therapy, and Re-evaluation  PLAN FOR NEXT SESSION: work on coordination of core and breathing, basic pelvic floor contraction, thoracic mobility   [employer], PT 02/12/2023, 4:23 PM

## 2023-02-12 ENCOUNTER — Encounter: Payer: Self-pay | Admitting: Physical Therapy

## 2023-02-12 ENCOUNTER — Ambulatory Visit: Payer: Medicare Other | Attending: Internal Medicine | Admitting: Physical Therapy

## 2023-02-12 ENCOUNTER — Other Ambulatory Visit: Payer: Self-pay

## 2023-02-12 DIAGNOSIS — R279 Unspecified lack of coordination: Secondary | ICD-10-CM

## 2023-02-12 DIAGNOSIS — R293 Abnormal posture: Secondary | ICD-10-CM | POA: Diagnosis not present

## 2023-02-12 DIAGNOSIS — R3915 Urgency of urination: Secondary | ICD-10-CM | POA: Insufficient documentation

## 2023-02-12 DIAGNOSIS — M6281 Muscle weakness (generalized): Secondary | ICD-10-CM

## 2023-02-12 DIAGNOSIS — R152 Fecal urgency: Secondary | ICD-10-CM | POA: Diagnosis not present

## 2023-02-15 ENCOUNTER — Other Ambulatory Visit: Payer: Self-pay | Admitting: Internal Medicine

## 2023-02-15 DIAGNOSIS — Z1231 Encounter for screening mammogram for malignant neoplasm of breast: Secondary | ICD-10-CM

## 2023-02-18 ENCOUNTER — Ambulatory Visit: Payer: Medicare Other | Admitting: Physical Therapy

## 2023-02-18 DIAGNOSIS — R279 Unspecified lack of coordination: Secondary | ICD-10-CM

## 2023-02-18 DIAGNOSIS — R293 Abnormal posture: Secondary | ICD-10-CM | POA: Diagnosis not present

## 2023-02-18 DIAGNOSIS — R3915 Urgency of urination: Secondary | ICD-10-CM | POA: Diagnosis not present

## 2023-02-18 DIAGNOSIS — M6281 Muscle weakness (generalized): Secondary | ICD-10-CM

## 2023-02-18 DIAGNOSIS — R152 Fecal urgency: Secondary | ICD-10-CM | POA: Diagnosis not present

## 2023-02-18 NOTE — Therapy (Signed)
OUTPATIENT PHYSICAL THERAPY FEMALE PELVIC TREATMENT   Patient Name: Frances Brown MRN: [id number] DOB:05-10-1946, 77 y.o., female Today's Date: 02/18/2023  END OF SESSION:  PT End of Session - 02/18/23 0835     Visit Number 2    Date for PT Re-Evaluation 05/07/23    Authorization Type Medicare    PT Start Time 0758    PT Stop Time 0836    PT Time Calculation (min) 38 min    Activity Tolerance Patient tolerated treatment well    Behavior During Therapy [facility] for tasks assessed/performed              Past Medical History:  Diagnosis Date   Allergy    Anxiety    no meds    Cancer ([place])    mole    Diabetes mellitus without complication ([place])    Diverticulosis    Fibrocystic breast disease    Fundic gland polyps of stomach, benign 03/24/2015   GERD (gastroesophageal reflux disease)    Hx of colonic polyps 04/24/2017   Hyperlipidemia    Hypertension    IBS (irritable bowel syndrome)    Macular degeneration    vitreous hemorrhage, does injections in rt every 5 weeks   Obesity    Papilledema, both eyes 07/28/2018   Dr Lorrene Reid   Tachycardia, unspecified 04/08/2017   pt. states she saw Dr. Heloise Ochoa for this no meds no procedures, wore monitor   Urge incontinence    Past Surgical History:  Procedure Laterality Date   BREAST EXCISIONAL BIOPSY Left    over 15 years ( she thinks it may have been a cysts removal but there is a scar   breast lymph node removed     right   CHOLECYSTECTOMY     COLONOSCOPY  06/2002, 04/10/2007   diverticulosis, colon polyps, external hemorrhoids   KNEE ARTHROSCOPY     left   PARTIAL HYSTERECTOMY     POLYPECTOMY     SHOULDER SURGERY     right   UPPER GASTROINTESTINAL ENDOSCOPY  09/27/11   VIDEO BRONCHOSCOPY Bilateral 02/14/2015   Procedure: VIDEO BRONCHOSCOPY WITHOUT FLUORO;  Surgeon: Collene Gobble, MD;  Location: [facility];  Service: Cardiopulmonary;  Laterality: Bilateral;   WISDOM TOOTH EXTRACTION     Patient Active  Problem List   Diagnosis Date Noted   Pain in left hip 09/19/2021   Pain in right leg 07/15/2019   Gastroesophageal reflux disease 07/14/2018   Abdominal pain, left lower quadrant 07/09/2017   Hoarseness 03/11/2015   Cough 01/28/2014   HTN (hypertension) 12/27/2011   Hyperlipidemia 12/27/2011   Obesity 08/16/2011   IBS (irritable bowel syndrome) 05/23/2011   PALPITATIONS 12/21/2010   Chronic left upper quadrant pain 08/08/2010    PCP: Marton Redwood, MD  REFERRING PROVIDER:   Gatha Mayer, MD    REFERRING DIAG:  R15.2 (ICD-10-CM) - Fecal urgency  R39.15 (ICD-10-CM) - Urinary urgency    THERAPY DIAG:  Muscle weakness (generalized)  Unspecified lack of coordination  Abnormal posture  Rationale for Evaluation and Treatment: Rehabilitation  ONSET DATE: bladder infections increasing for 5 years; partial hysterectomy in my 67s and bladder has been dropped since that time  SUBJECTIVE:  SUBJECTIVE STATEMENT: Pt has a dry cough today due to allergies.  Pt states sometimes turning to the left feels a catch. Fluid intake: Yes: try to drink water and with meals but maybe not enough    PAIN:  Are you having pain? No   PRECAUTIONS: None  WEIGHT BEARING RESTRICTIONS: No  FALLS:  Has patient fallen in last 6 months? No  LIVING ENVIRONMENT: Lives with: lives with their spouse Lives in: House/apartment   OCCUPATION:   PLOF: Independent  PATIENT GOALS: strengthen pelvic floor and see if it helps not have as many infections  PERTINENT HISTORY:  Partial hysterectomy, left knee surgery, IBS, diverticulosis, bladder tacked Sexual abuse: No  BOWEL MOVEMENT: Pain with bowel movement: Yes Type of bowel movement:Type (Bristol Stool Scale) can be watery, Frequency daily up to 3x, and  Strain No Fully empty rectum: Yes:   Leakage: No Pads: No Fiber supplement: No  URINATION: Pain with urination: Yes Fully empty bladder: No Stream:  sometimes weak Urgency: Yes:   Frequency: have to go right after drinking sometimes; nocturia 1-2/night depends on medication or if having an infection Leakage:  no Pads: No  INTERCOURSE: Pain with intercourse:  at times   PREGNANCY: Vaginal deliveries 2   PROLAPSE: Cystocele bulging out   OBJECTIVE:   DIAGNOSTIC FINDINGS:    PATIENT SURVEYS:    PFIQ-7   COGNITION: Overall cognitive status: Within functional limits for tasks assessed     SENSATION: Light touch:  Proprioception:   MUSCLE LENGTH: Hamstrings: Right 90 deg; Left 85 deg Thomas test:   LUMBAR SPECIAL TESTS:  Straight leg raise test: Negative  FUNCTIONAL TESTS:  Single leg stand normal  GAIT:  Comments: WFL   POSTURE: forward head and increased thoracic kyphosis  PELVIC ALIGNMENT: left anterior rotation  LUMBARAROM/PROM:  A/PROM A/PROM  eval  Flexion 80%  Extension   Right lateral flexion 80%  Left lateral flexion 100%  Right rotation   Left rotation    (Blank rows = not tested)  LOWER EXTREMITY ROM:  Passive ROM Right eval Left eval  Hip flexion [facility]  [facility]   Hip extension    Hip abduction    Hip adduction    Hip internal rotation 50% 50%  Hip external rotation 75% 80%  Knee flexion    Knee extension    Ankle dorsiflexion    Ankle plantarflexion    Ankle inversion    Ankle eversion     (Blank rows = not tested)  LOWER EXTREMITY MMT: bilateral hip strength 4/5  MMT Right eval Left eval  Hip flexion    Hip extension    Hip abduction    Hip adduction    Hip internal rotation    Hip external rotation    Knee flexion    Knee extension    Ankle dorsiflexion    Ankle plantarflexion    Ankle inversion    Ankle eversion     PALPATION:   General  tight lumbar paraspinals                External Perineal Exam  palpation external to clothing to assess how they do a kegel - holding breathing and co-contracting - can do 2 reps and then not relaxing                             Internal Pelvic Floor NA - deferred due to pt request  Patient confirms identification and  approves PT to assess internal pelvic floor and treatment No  PELVIC MMT:   MMT eval  Vaginal   Internal Anal Sphincter   External Anal Sphincter   Puborectalis   Diastasis Recti   (Blank rows = not tested)        TONE: NA  PROLAPSE: Bladder per patient  TODAY'S TREATMENT:                                                                                                                              DATE: 02/18/23   NMRE: Breathing with diaphragm Breathing and core activated Breathing and transversus abdominus with press on LE Breathing and transversus abdominus and kegel with ball Breathing and transversus abdominus with post pelvic tilt and mini bridge Exercise: Double KTC Single KTC  KTC with rocking Sitting thoracic ext with ball  PATIENT EDUCATION:  Education details: bowel diary, toileting, prolapse handout Person educated: Patient Education method: Explanation, Verbal cues, and Handouts Education comprehension: verbalized understanding  HOME EXERCISE PROGRAM:   ASSESSMENT:  CLINICAL IMPRESSION: Pt did well with core strengthening with breathing.  Able to do initial HEP for basic core and pelvic floor strengthen.  Pt needed cues throughout for core and slightly elevated head to get improved abdominal contraction.  Pt will benefit from skilled PT to address these impairments for improved prolapse and bladder control and overall function.  OBJECTIVE IMPAIRMENTS: decreased coordination, decreased endurance, decreased ROM, decreased strength, increased muscle spasms, impaired flexibility, postural dysfunction, and pain.   ACTIVITY LIMITATIONS: lifting, standing, continence, and toileting  PARTICIPATION  LIMITATIONS: interpersonal relationship and community activity  PERSONAL FACTORS: 3+ comorbidities: Partial hysterectomy, left knee surgery, IBS, diverticulosis, bladder tacked  are also affecting patient's functional outcome.   REHAB POTENTIAL: Excellent  CLINICAL DECISION MAKING: Evolving/moderate complexity  EVALUATION COMPLEXITY: Moderate   GOALS: Goals reviewed with patient? Yes  SHORT TERM GOALS: Target date: 03/12/23  Ind with initial HEP Baseline: Goal status: IN PROGRESS  2.  Ind with toileting technique Baseline:  Goal status: MET    LONG TERM GOALS: Target date: 05/07/23  Pt will be independent with advanced HEP to maintain improvements made throughout therapy  Baseline:  Goal status: INITIAL  2.  Pt will have 50% less urgency due to bowel/bladder retraining and strengthening  Baseline:  Goal status: INITIAL  3.  Pt will demonstrate 4+/5 bilateral hip strength for improved functional activities Baseline:  Goal status: INITIAL  4.  Pt will have at least 50% less discomfort due to bladder prolapse Baseline:  Goal status: INITIAL    PLAN:  PT FREQUENCY: 1x/week  PT DURATION: 12 weeks  PLANNED INTERVENTIONS: Therapeutic exercises, Therapeutic activity, Neuromuscular re-education, Balance training, Gait training, Patient/Family education, Self Care, Joint mobilization, Dry Needling, Electrical stimulation, Cryotherapy, Moist heat, Taping, Biofeedback, Manual therapy, and Re-evaluation  PLAN FOR NEXT SESSION: abdominal fascial release, cont upper body   Camillo Flaming Lorice Lafave, PT 02/18/2023, 8:41 AM

## 2023-02-21 ENCOUNTER — Ambulatory Visit: Payer: Medicare Other | Admitting: Physical Therapy

## 2023-02-21 DIAGNOSIS — M6281 Muscle weakness (generalized): Secondary | ICD-10-CM | POA: Diagnosis not present

## 2023-02-21 DIAGNOSIS — R152 Fecal urgency: Secondary | ICD-10-CM | POA: Diagnosis not present

## 2023-02-21 DIAGNOSIS — R293 Abnormal posture: Secondary | ICD-10-CM | POA: Diagnosis not present

## 2023-02-21 DIAGNOSIS — R279 Unspecified lack of coordination: Secondary | ICD-10-CM

## 2023-02-21 DIAGNOSIS — R3915 Urgency of urination: Secondary | ICD-10-CM | POA: Diagnosis not present

## 2023-02-21 NOTE — Therapy (Signed)
OUTPATIENT PHYSICAL THERAPY FEMALE PELVIC TREATMENT   Patient Name: Frances Brown MRN: [id number] DOB:19-Oct-1946, 77 y.o., female Today's Date: 02/21/2023  END OF SESSION:  PT End of Session - 02/21/23 1230     Visit Number 3    Date for PT Re-Evaluation 05/07/23    Authorization Type Medicare    PT Start Time 1231    PT Stop Time 1310    PT Time Calculation (min) 39 min    Activity Tolerance Patient tolerated treatment well    Behavior During Therapy WFL for tasks assessed/performed               Past Medical History:  Diagnosis Date   Allergy    Anxiety    no meds    Cancer ([place])    mole    Diabetes mellitus without complication ([place])    Diverticulosis    Fibrocystic breast disease    Fundic gland polyps of stomach, benign 03/24/2015   GERD (gastroesophageal reflux disease)    Hx of colonic polyps 04/24/2017   Hyperlipidemia    Hypertension    IBS (irritable bowel syndrome)    Macular degeneration    vitreous hemorrhage, does injections in rt every 5 weeks   Obesity    Papilledema, both eyes 07/28/2018   Dr Lorrene Reid   Tachycardia, unspecified 04/08/2017   pt. states she saw Dr. Heloise Ochoa for this no meds no procedures, wore monitor   Urge incontinence    Past Surgical History:  Procedure Laterality Date   BREAST EXCISIONAL BIOPSY Left    over 15 years ( she thinks it may have been a cysts removal but there is a scar   breast lymph node removed     right   CHOLECYSTECTOMY     COLONOSCOPY  06/2002, 04/10/2007   diverticulosis, colon polyps, external hemorrhoids   KNEE ARTHROSCOPY     left   PARTIAL HYSTERECTOMY     POLYPECTOMY     SHOULDER SURGERY     right   UPPER GASTROINTESTINAL ENDOSCOPY  09/27/11   VIDEO BRONCHOSCOPY Bilateral 02/14/2015   Procedure: VIDEO BRONCHOSCOPY WITHOUT FLUORO;  Surgeon: Collene Gobble, MD;  Location: [facility];  Service: Cardiopulmonary;  Laterality: Bilateral;   WISDOM TOOTH EXTRACTION     Patient Active  Problem List   Diagnosis Date Noted   Pain in left hip 09/19/2021   Pain in right leg 07/15/2019   Gastroesophageal reflux disease 07/14/2018   Abdominal pain, left lower quadrant 07/09/2017   Hoarseness 03/11/2015   Cough 01/28/2014   HTN (hypertension) 12/27/2011   Hyperlipidemia 12/27/2011   Obesity 08/16/2011   IBS (irritable bowel syndrome) 05/23/2011   PALPITATIONS 12/21/2010   Chronic left upper quadrant pain 08/08/2010    PCP: Marton Redwood, MD  REFERRING PROVIDER:   Gatha Mayer, MD    REFERRING DIAG:  R15.2 (ICD-10-CM) - Fecal urgency  R39.15 (ICD-10-CM) - Urinary urgency    THERAPY DIAG:  Muscle weakness (generalized)  Unspecified lack of coordination  Abnormal posture  Rationale for Evaluation and Treatment: Rehabilitation  ONSET DATE: bladder infections increasing for 5 years; partial hysterectomy in my 56s and bladder has been dropped since that time  SUBJECTIVE:  SUBJECTIVE STATEMENT: Pt has a dry cough today due to allergies.  Pt states sometimes turning to the left feels a catch. Fluid intake: Yes: try to drink water and with meals but maybe not enough    PAIN:  Are you having pain? No   PRECAUTIONS: None  WEIGHT BEARING RESTRICTIONS: No  FALLS:  Has patient fallen in last 6 months? No  LIVING ENVIRONMENT: Lives with: lives with their spouse Lives in: House/apartment   OCCUPATION:   PLOF: Independent  PATIENT GOALS: strengthen pelvic floor and see if it helps not have as many infections  PERTINENT HISTORY:  Partial hysterectomy, left knee surgery, IBS, diverticulosis, bladder tacked Sexual abuse: No  BOWEL MOVEMENT: Pain with bowel movement: Yes Type of bowel movement:Type (Bristol Stool Scale) can be watery, Frequency daily up to 3x, and  Strain No Fully empty rectum: Yes:   Leakage: No Pads: No Fiber supplement: No  URINATION: Pain with urination: Yes Fully empty bladder: No Stream:  sometimes weak Urgency: Yes:   Frequency: have to go right after drinking sometimes; nocturia 1-2/night depends on medication or if having an infection Leakage:  no Pads: No  INTERCOURSE: Pain with intercourse:  at times   PREGNANCY: Vaginal deliveries 2   PROLAPSE: Cystocele bulging out   OBJECTIVE:   DIAGNOSTIC FINDINGS:    PATIENT SURVEYS:    PFIQ-7   COGNITION: Overall cognitive status: Within functional limits for tasks assessed     SENSATION: Light touch:  Proprioception:   MUSCLE LENGTH: Hamstrings: Right 90 deg; Left 85 deg Thomas test:   LUMBAR SPECIAL TESTS:  Straight leg raise test: Negative  FUNCTIONAL TESTS:  Single leg stand normal  GAIT:  Comments: WFL   POSTURE: forward head and increased thoracic kyphosis  PELVIC ALIGNMENT: left anterior rotation  LUMBARAROM/PROM:  A/PROM A/PROM  eval  Flexion 80%  Extension   Right lateral flexion 80%  Left lateral flexion 100%  Right rotation   Left rotation    (Blank rows = not tested)  LOWER EXTREMITY ROM:  Passive ROM Right eval Left eval  Hip flexion [facility]  [facility]   Hip extension    Hip abduction    Hip adduction    Hip internal rotation 50% 50%  Hip external rotation 75% 80%  Knee flexion    Knee extension    Ankle dorsiflexion    Ankle plantarflexion    Ankle inversion    Ankle eversion     (Blank rows = not tested)  LOWER EXTREMITY MMT: bilateral hip strength 4/5  MMT Right eval Left eval  Hip flexion    Hip extension    Hip abduction    Hip adduction    Hip internal rotation    Hip external rotation    Knee flexion    Knee extension    Ankle dorsiflexion    Ankle plantarflexion    Ankle inversion    Ankle eversion     PALPATION:   General  tight lumbar paraspinals                External Perineal Exam  palpation external to clothing to assess how they do a kegel - holding breathing and co-contracting - can do 2 reps and then not relaxing                             Internal Pelvic Floor NA - deferred due to pt request  Patient confirms identification and  approves PT to assess internal pelvic floor and treatment No  PELVIC MMT:   MMT eval  Vaginal   Internal Anal Sphincter   External Anal Sphincter   Puborectalis   Diastasis Recti   (Blank rows = not tested)        TONE: NA  PROLAPSE: Bladder per patient  TODAY'S TREATMENT:                                                                                                                              DATE: 02/22/23   NMRE: Breathing with diaphragm Breathing and core activated Breathing and transversus abdominus with press on LE Breathing and transversus abdominus and kegel with ball Breathing and transversus abdominus with post pelvic tilt and mini bridge Exercise: Double KTC Single KTC  KTC with rocking Sitting thoracic ext with ball Manual:  Abdominal fascial release to descending and lower abdomen  PATIENT EDUCATION:  Education details: Access Code: [email or web address] Person educated: Patient Education method: Explanation, Verbal cues, and Handouts Education comprehension: verbalized understanding  HOME EXERCISE PROGRAM: Access Code: [email or web address] URL: [email or web address] Date: 02/21/2023 Prepared by: Jari Favre  Exercises - Supine Transversus Abdominis Bracing - Hands on Thighs  - 1 x daily - 7 x weekly - 1 sets - 10 reps - 5 sec hold - Supine Diaphragmatic Breathing  - 3 x daily - 7 x weekly - 1 sets - 10 reps - Supine Bridge with Pelvic Floor Contraction  - 3 x daily - 7 x weekly - 1 sets - 10 reps - 3 sec hold - Seated Shoulder Flexion with Dumbbells  - 1 x daily - 7 x weekly - 3 sets - 10 reps - Seated Long Arc Quad with Hip Adduction  - 1 x daily - 7 x weekly - 3 sets - 10  reps  ASSESSMENT:  CLINICAL IMPRESSION: Pt did well with progressing core strengthening with breathing.  Able to do initial HEP for basic core and pelvic floor strengthen.  Pt needed cues throughout for core and slightly elevated head to get improved abdominal contraction.  Pt will benefit from skilled PT to address these impairments for improved prolapse and bladder control and overall function.  OBJECTIVE IMPAIRMENTS: decreased coordination, decreased endurance, decreased ROM, decreased strength, increased muscle spasms, impaired flexibility, postural dysfunction, and pain.   ACTIVITY LIMITATIONS: lifting, standing, continence, and toileting  PARTICIPATION LIMITATIONS: interpersonal relationship and community activity  PERSONAL FACTORS: 3+ comorbidities: Partial hysterectomy, left knee surgery, IBS, diverticulosis, bladder tacked  are also affecting patient's functional outcome.   REHAB POTENTIAL: Excellent  CLINICAL DECISION MAKING: Evolving/moderate complexity  EVALUATION COMPLEXITY: Moderate   GOALS: Goals reviewed with patient? Yes  SHORT TERM GOALS: Target date: 03/12/23  Ind with initial HEP Baseline: Goal status: MET  2.  Ind with toileting technique Baseline:  Goal status: MET    LONG TERM GOALS: Target date: 05/07/23  Pt will be independent with advanced HEP to maintain improvements made throughout  therapy  Baseline:  Goal status: IN PROGRESS  2.  Pt will have 50% less urgency due to bowel/bladder retraining and strengthening  Baseline:  Goal status: IN PROGRESS  3.  Pt will demonstrate 4+/5 bilateral hip strength for improved functional activities Baseline:  Goal status: IN PROGRESS  4.  Pt will have at least 50% less discomfort due to bladder prolapse Baseline:  Goal status: IN PROGRESS    PLAN:  PT FREQUENCY: 1x/week  PT DURATION: 12 weeks  PLANNED INTERVENTIONS: Therapeutic exercises, Therapeutic activity, Neuromuscular re-education,  Balance training, Gait training, Patient/Family education, Self Care, Joint mobilization, Dry Needling, Electrical stimulation, Cryotherapy, Moist heat, Taping, Biofeedback, Manual therapy, and Re-evaluation  PLAN FOR NEXT SESSION: lower abdominal scar and fascial release Lt>Rt, cont upper body and hip strength, side lying core and hip, see if will be okay with assessing muscle or biofeedback pelvic floor if still having difficulty   Jule Ser, PT 02/21/2023, 12:33 PM

## 2023-03-11 ENCOUNTER — Encounter: Payer: Self-pay | Admitting: Physical Therapy

## 2023-03-11 ENCOUNTER — Ambulatory Visit: Payer: Medicare Other | Attending: Internal Medicine | Admitting: Physical Therapy

## 2023-03-11 DIAGNOSIS — R279 Unspecified lack of coordination: Secondary | ICD-10-CM

## 2023-03-11 DIAGNOSIS — M6281 Muscle weakness (generalized): Secondary | ICD-10-CM | POA: Diagnosis not present

## 2023-03-11 DIAGNOSIS — R293 Abnormal posture: Secondary | ICD-10-CM

## 2023-03-11 NOTE — Therapy (Signed)
OUTPATIENT PHYSICAL THERAPY FEMALE PELVIC TREATMENT   Patient Name: Frances Brown MRN: [id number] DOB:09/30/1946, 77 y.o., female Today's Date: 03/11/2023  END OF SESSION:  PT End of Session - 03/11/23 0849     Visit Number 4    Date for PT Re-Evaluation 05/07/23    Authorization Type Medicare    PT Start Time 0849    PT Stop Time 0930    PT Time Calculation (min) 41 min    Activity Tolerance Patient tolerated treatment well    Behavior During Therapy [facility] for tasks assessed/performed                Past Medical History:  Diagnosis Date   Allergy    Anxiety    no meds    Cancer    mole    Diabetes mellitus without complication    Diverticulosis    Fibrocystic breast disease    Fundic gland polyps of stomach, benign 03/24/2015   GERD (gastroesophageal reflux disease)    Hx of colonic polyps 04/24/2017   Hyperlipidemia    Hypertension    IBS (irritable bowel syndrome)    Macular degeneration    vitreous hemorrhage, does injections in rt every 5 weeks   Obesity    Papilledema, both eyes 07/28/2018   Dr Lorrene Reid   Tachycardia, unspecified 04/08/2017   pt. states she saw Dr. Heloise Ochoa for this no meds no procedures, wore monitor   Urge incontinence    Past Surgical History:  Procedure Laterality Date   BREAST EXCISIONAL BIOPSY Left    over 15 years ( she thinks it may have been a cysts removal but there is a scar   breast lymph node removed     right   CHOLECYSTECTOMY     COLONOSCOPY  06/2002, 04/10/2007   diverticulosis, colon polyps, external hemorrhoids   KNEE ARTHROSCOPY     left   PARTIAL HYSTERECTOMY     POLYPECTOMY     SHOULDER SURGERY     right   UPPER GASTROINTESTINAL ENDOSCOPY  09/27/11   VIDEO BRONCHOSCOPY Bilateral 02/14/2015   Procedure: VIDEO BRONCHOSCOPY WITHOUT FLUORO;  Surgeon: Collene Gobble, MD;  Location: [facility];  Service: Cardiopulmonary;  Laterality: Bilateral;   WISDOM TOOTH EXTRACTION     Patient Active Problem List    Diagnosis Date Noted   Pain in left hip 09/19/2021   Pain in right leg 07/15/2019   Gastroesophageal reflux disease 07/14/2018   Abdominal pain, left lower quadrant 07/09/2017   Hoarseness 03/11/2015   Cough 01/28/2014   HTN (hypertension) 12/27/2011   Hyperlipidemia 12/27/2011   Obesity 08/16/2011   IBS (irritable bowel syndrome) 05/23/2011   PALPITATIONS 12/21/2010   Chronic left upper quadrant pain 08/08/2010    PCP: Marton Redwood, MD  REFERRING PROVIDER:   Gatha Mayer, MD    REFERRING DIAG:  R15.2 (ICD-10-CM) - Fecal urgency  R39.15 (ICD-10-CM) - Urinary urgency    THERAPY DIAG:  Muscle weakness (generalized)  Unspecified lack of coordination  Abnormal posture  Rationale for Evaluation and Treatment: Rehabilitation  ONSET DATE: bladder infections increasing for 5 years; partial hysterectomy in my 31s and bladder has been dropped since that time  SUBJECTIVE:  SUBJECTIVE STATEMENT: Pt has a dry cough today due to allergies.  Pt states sometimes turning to the left feels a catch. Fluid intake: Yes: try to drink water and with meals but maybe not enough    PAIN:  Are you having pain? No   PRECAUTIONS: None  WEIGHT BEARING RESTRICTIONS: No  FALLS:  Has patient fallen in last 6 months? No  LIVING ENVIRONMENT: Lives with: lives with their spouse Lives in: House/apartment   OCCUPATION:   PLOF: Independent  PATIENT GOALS: strengthen pelvic floor and see if it helps not have as many infections  PERTINENT HISTORY:  Partial hysterectomy, left knee surgery, IBS, diverticulosis, bladder tacked Sexual abuse: No  BOWEL MOVEMENT: Pain with bowel movement: Yes Type of bowel movement:Type (Bristol Stool Scale) can be watery, Frequency daily up to 3x, and Strain No Fully  empty rectum: Yes:   Leakage: No Pads: No Fiber supplement: No  URINATION: Pain with urination: Yes Fully empty bladder: No Stream:  sometimes weak Urgency: Yes:   Frequency: have to go right after drinking sometimes; nocturia 1-2/night depends on medication or if having an infection Leakage:  no Pads: No  INTERCOURSE: Pain with intercourse:  at times   PREGNANCY: Vaginal deliveries 2   PROLAPSE: Cystocele bulging out   OBJECTIVE:   DIAGNOSTIC FINDINGS:    PATIENT SURVEYS:    PFIQ-7   COGNITION: Overall cognitive status: Within functional limits for tasks assessed     SENSATION: Light touch:  Proprioception:   MUSCLE LENGTH: Hamstrings: Right 90 deg; Left 85 deg Thomas test:   LUMBAR SPECIAL TESTS:  Straight leg raise test: Negative  FUNCTIONAL TESTS:  Single leg stand normal  GAIT:  Comments: WFL   POSTURE: forward head and increased thoracic kyphosis  PELVIC ALIGNMENT: left anterior rotation  LUMBARAROM/PROM:  A/PROM A/PROM  eval  Flexion 80%  Extension   Right lateral flexion 80%  Left lateral flexion 100%  Right rotation   Left rotation    (Blank rows = not tested)  LOWER EXTREMITY ROM:  Passive ROM Right eval Left eval  Hip flexion [facility]   Hip extension    Hip abduction    Hip adduction    Hip internal rotation 50% 50%  Hip external rotation 75% 80%  Knee flexion    Knee extension    Ankle dorsiflexion    Ankle plantarflexion    Ankle inversion    Ankle eversion     (Blank rows = not tested)  LOWER EXTREMITY MMT: bilateral hip strength 4/5  MMT Right eval Left eval  Hip flexion    Hip extension    Hip abduction    Hip adduction    Hip internal rotation    Hip external rotation    Knee flexion    Knee extension    Ankle dorsiflexion    Ankle plantarflexion    Ankle inversion    Ankle eversion     PALPATION:   General  tight lumbar paraspinals                External Perineal Exam palpation external  to clothing to assess how they do a kegel - holding breathing and co-contracting - can do 2 reps and then not relaxing                             Internal Pelvic Floor NA - deferred due to pt request  Patient confirms identification and  approves PT to assess internal pelvic floor and treatment No  PELVIC MMT:   MMT eval  Vaginal   Internal Anal Sphincter   External Anal Sphincter   Puborectalis   Diastasis Recti   (Blank rows = not tested)        TONE: NA  PROLAPSE: Bladder per patient  TODAY'S TREATMENT:                                                                                                                              DATE: 03/11/23   NMRE: Breathing with diaphragm and core activated and kegel added to all exercises - VC and TC to make sure doing correctly Green band - hip abduction and horizontal abduction - core activated Pressing on thighs to activate transversus abdominus with ball Pball presses activation of transversus abdominus  Exercise: on wedge pillow Double KTC Single KTC  Piriformis stretch  Manual:  fascial release to stomach and around Left ribcage more more rotation to the Rt  PATIENT EDUCATION:  Education details: Access Code: [email or web address] Person educated: Patient Education method: Explanation, Verbal cues, and Handouts Education comprehension: verbalized understanding  HOME EXERCISE PROGRAM: Access Code: [email or web address] URL: [email or web address] Date: 02/21/2023 Prepared by: Jari Favre  Exercises - Supine Transversus Abdominis Bracing - Hands on Thighs  - 1 x daily - 7 x weekly - 1 sets - 10 reps - 5 sec hold - Supine Diaphragmatic Breathing  - 3 x daily - 7 x weekly - 1 sets - 10 reps - Supine Bridge with Pelvic Floor Contraction  - 3 x daily - 7 x weekly - 1 sets - 10 reps - 3 sec hold - Seated Shoulder Flexion with Dumbbells  - 1 x daily - 7 x weekly - 3 sets - 10 reps - Seated Long Arc Quad with Hip Adduction  - 1 x  daily - 7 x weekly - 3 sets - 10 reps  ASSESSMENT:  CLINICAL IMPRESSION: Pt did well with progressing core strengthening with breathing.  Able to add pelvic floor contraction and was doing correctly with engaged muscles and not bulging when doing a kegel.  Pt needed cues to keep from using accessory muscles with breathing and keep shoulders down but elevating table helped with that.   Pt will benefit from skilled PT to address these impairments for improved prolapse and bladder control and overall function.  OBJECTIVE IMPAIRMENTS: decreased coordination, decreased endurance, decreased ROM, decreased strength, increased muscle spasms, impaired flexibility, postural dysfunction, and pain.   ACTIVITY LIMITATIONS: lifting, standing, continence, and toileting  PARTICIPATION LIMITATIONS: interpersonal relationship and community activity  PERSONAL FACTORS: 3+ comorbidities: Partial hysterectomy, left knee surgery, IBS, diverticulosis, bladder tacked  are also affecting patient's functional outcome.   REHAB POTENTIAL: Excellent  CLINICAL DECISION MAKING: Evolving/moderate complexity  EVALUATION COMPLEXITY: Moderate   GOALS: Goals reviewed with patient? Yes  SHORT TERM GOALS: Target date: 03/12/23  Ind with initial HEP Baseline: Goal status: MET  2.  Ind with toileting technique Baseline:  Goal status: MET    LONG TERM GOALS: Target date: 05/07/23 Updated 03/11/23  Pt will be independent with advanced HEP to maintain improvements made throughout therapy  Baseline:  Goal status: IN PROGRESS  2.  Pt will have 50% less urgency due to bowel/bladder retraining and strengthening  Baseline: same - there is something on the left side under the ribcage Goal status: IN PROGRESS  3.  Pt will demonstrate 4+/5 bilateral hip strength for improved functional activities Baseline:  Goal status: IN PROGRESS  4.  Pt will have at least 50% less discomfort due to bladder prolapse Baseline: not  changed - only feel it when going to the bathroom and in the shower/bathing Goal status: IN PROGRESS    PLAN:  PT FREQUENCY: 1x/week  PT DURATION: 12 weeks  PLANNED INTERVENTIONS: Therapeutic exercises, Therapeutic activity, Neuromuscular re-education, Balance training, Gait training, Patient/Family education, Self Care, Joint mobilization, Dry Needling, Electrical stimulation, Cryotherapy, Moist heat, Taping, Biofeedback, Manual therapy, and Re-evaluation  PLAN FOR NEXT SESSION: lower abdominal scar and fascial release Lt>Rt, cont upper body and hip strength, side lying core and hip, progress HEP as able   [employer], PT 03/11/2023, 8:50 AM

## 2023-03-22 DIAGNOSIS — H353123 Nonexudative age-related macular degeneration, left eye, advanced atrophic without subfoveal involvement: Secondary | ICD-10-CM | POA: Diagnosis not present

## 2023-03-22 DIAGNOSIS — H353211 Exudative age-related macular degeneration, right eye, with active choroidal neovascularization: Secondary | ICD-10-CM | POA: Diagnosis not present

## 2023-03-22 DIAGNOSIS — E113593 Type 2 diabetes mellitus with proliferative diabetic retinopathy without macular edema, bilateral: Secondary | ICD-10-CM | POA: Diagnosis not present

## 2023-03-27 DIAGNOSIS — G720 Drug-induced myopathy: Secondary | ICD-10-CM | POA: Diagnosis not present

## 2023-03-27 DIAGNOSIS — I129 Hypertensive chronic kidney disease with stage 1 through stage 4 chronic kidney disease, or unspecified chronic kidney disease: Secondary | ICD-10-CM | POA: Diagnosis not present

## 2023-03-27 DIAGNOSIS — E785 Hyperlipidemia, unspecified: Secondary | ICD-10-CM | POA: Diagnosis not present

## 2023-03-27 DIAGNOSIS — N1832 Chronic kidney disease, stage 3b: Secondary | ICD-10-CM | POA: Diagnosis not present

## 2023-03-27 DIAGNOSIS — E1129 Type 2 diabetes mellitus with other diabetic kidney complication: Secondary | ICD-10-CM | POA: Diagnosis not present

## 2023-04-04 ENCOUNTER — Ambulatory Visit: Payer: Medicare Other | Admitting: Physical Therapy

## 2023-04-04 ENCOUNTER — Encounter: Payer: Self-pay | Admitting: Physical Therapy

## 2023-04-04 DIAGNOSIS — R279 Unspecified lack of coordination: Secondary | ICD-10-CM

## 2023-04-04 DIAGNOSIS — M6281 Muscle weakness (generalized): Secondary | ICD-10-CM

## 2023-04-04 DIAGNOSIS — R293 Abnormal posture: Secondary | ICD-10-CM | POA: Diagnosis not present

## 2023-04-04 NOTE — Therapy (Signed)
OUTPATIENT PHYSICAL THERAPY FEMALE PELVIC TREATMENT   Patient Name: Frances Brown MRN: [id number] DOB:09/02/1946, 77 y.o., female Today's Date: 04/04/2023  END OF SESSION:  PT End of Session - 04/04/23 0849     Visit Number 5    Date for PT Re-Evaluation 05/07/23    Authorization Type Medicare    PT Start Time 0846    PT Stop Time 0926    PT Time Calculation (min) 40 min    Activity Tolerance Patient tolerated treatment well    Behavior During Therapy [facility] for tasks assessed/performed                 Past Medical History:  Diagnosis Date   Allergy    Anxiety    no meds    Cancer    mole    Diabetes mellitus without complication    Diverticulosis    Fibrocystic breast disease    Fundic gland polyps of stomach, benign 03/24/2015   GERD (gastroesophageal reflux disease)    Hx of colonic polyps 04/24/2017   Hyperlipidemia    Hypertension    IBS (irritable bowel syndrome)    Macular degeneration    vitreous hemorrhage, does injections in rt every 5 weeks   Obesity    Papilledema, both eyes 07/28/2018   Dr Octavia Heir   Tachycardia, unspecified 04/08/2017   pt. states she saw Dr. Alford Highland for this no meds no procedures, wore monitor   Urge incontinence    Past Surgical History:  Procedure Laterality Date   BREAST EXCISIONAL BIOPSY Left    over 15 years ( she thinks it may have been a cysts removal but there is a scar   breast lymph node removed     right   CHOLECYSTECTOMY     COLONOSCOPY  06/2002, 04/10/2007   diverticulosis, colon polyps, external hemorrhoids   KNEE ARTHROSCOPY     left   PARTIAL HYSTERECTOMY     POLYPECTOMY     SHOULDER SURGERY     right   UPPER GASTROINTESTINAL ENDOSCOPY  09/27/11   VIDEO BRONCHOSCOPY Bilateral 02/14/2015   Procedure: VIDEO BRONCHOSCOPY WITHOUT FLUORO;  Surgeon: Leslye Peer, MD;  Location: [facility];  Service: Cardiopulmonary;  Laterality: Bilateral;   WISDOM TOOTH EXTRACTION     Patient Active Problem  List   Diagnosis Date Noted   Pain in left hip 09/19/2021   Pain in right leg 07/15/2019   Gastroesophageal reflux disease 07/14/2018   Abdominal pain, left lower quadrant 07/09/2017   Hoarseness 03/11/2015   Cough 01/28/2014   HTN (hypertension) 12/27/2011   Hyperlipidemia 12/27/2011   Obesity 08/16/2011   IBS (irritable bowel syndrome) 05/23/2011   PALPITATIONS 12/21/2010   Chronic left upper quadrant pain 08/08/2010    PCP: Martha Clan, MD  REFERRING PROVIDER:   Iva Boop, MD    REFERRING DIAG:  R15.2 (ICD-10-CM) - Fecal urgency  R39.15 (ICD-10-CM) - Urinary urgency    THERAPY DIAG:  Muscle weakness (generalized)  Unspecified lack of coordination  Abnormal posture  Rationale for Evaluation and Treatment: Rehabilitation  ONSET DATE: bladder infections increasing for 5 years; partial hysterectomy in my 20s and bladder has been dropped since that time  SUBJECTIVE:  SUBJECTIVE STATEMENT: Pt was vomiting a lot and found out it was due to ozempic.  Fluid intake: Yes: try to drink water and with meals but maybe not enough    PAIN:  Are you having pain? No   PRECAUTIONS: None  WEIGHT BEARING RESTRICTIONS: No  FALLS:  Has patient fallen in last 6 months? No  LIVING ENVIRONMENT: Lives with: lives with their spouse Lives in: House/apartment   OCCUPATION:   PLOF: Independent  PATIENT GOALS: strengthen pelvic floor and see if it helps not have as many infections  PERTINENT HISTORY:  Partial hysterectomy, left knee surgery, IBS, diverticulosis, bladder tacked Sexual abuse: No  BOWEL MOVEMENT: Pain with bowel movement: Yes Type of bowel movement:Type (Bristol Stool Scale) can be watery, Frequency daily up to 3x, and Strain No Fully empty rectum: Yes:   Leakage:  No Pads: No Fiber supplement: No  URINATION: Pain with urination: Yes Fully empty bladder: No Stream:  sometimes weak Urgency: Yes:   Frequency: have to go right after drinking sometimes; nocturia 1-2/night depends on medication or if having an infection Leakage:  no Pads: No  INTERCOURSE: Pain with intercourse:  at times   PREGNANCY: Vaginal deliveries 2   PROLAPSE: Cystocele bulging out   OBJECTIVE:   DIAGNOSTIC FINDINGS:    PATIENT SURVEYS:    PFIQ-7   COGNITION: Overall cognitive status: Within functional limits for tasks assessed     SENSATION: Light touch:  Proprioception:   MUSCLE LENGTH: Hamstrings: Right 90 deg; Left 85 deg Thomas test:   LUMBAR SPECIAL TESTS:  Straight leg raise test: Negative  FUNCTIONAL TESTS:  Single leg stand normal  GAIT:  Comments: WFL   POSTURE: forward head and increased thoracic kyphosis  PELVIC ALIGNMENT: left anterior rotation  LUMBARAROM/PROM:  A/PROM A/PROM  eval  Flexion 80%  Extension   Right lateral flexion 80%  Left lateral flexion 100%  Right rotation   Left rotation    (Blank rows = not tested)  LOWER EXTREMITY ROM:  Passive ROM Right eval Left eval  Hip flexion [facility]   Hip extension    Hip abduction    Hip adduction    Hip internal rotation 50% 50%  Hip external rotation 75% 80%  Knee flexion    Knee extension    Ankle dorsiflexion    Ankle plantarflexion    Ankle inversion    Ankle eversion     (Blank rows = not tested)  LOWER EXTREMITY MMT: bilateral hip strength 4/5  MMT Right eval Left eval  Hip flexion    Hip extension    Hip abduction    Hip adduction    Hip internal rotation    Hip external rotation    Knee flexion    Knee extension    Ankle dorsiflexion    Ankle plantarflexion    Ankle inversion    Ankle eversion     PALPATION:   General  tight lumbar paraspinals                External Perineal Exam palpation external to clothing to assess how they  do a kegel - holding breathing and co-contracting - can do 2 reps and then not relaxing                             Internal Pelvic Floor NA - deferred due to pt request  Patient confirms identification and approves PT to assess internal pelvic  floor and treatment No  PELVIC MMT:   MMT eval  Vaginal   Internal Anal Sphincter   External Anal Sphincter   Puborectalis   Diastasis Recti   (Blank rows = not tested)        TONE: NA  PROLAPSE: Bladder per patient  TODAY'S TREATMENT:                                                                                                                              DATE: 04/04/23   NMRE: Breathing cues and posture cues with exercises  Exercise: Thoracic rotation at wall  - 10x bil Thoracic extension ball roll up wall 10x Shoulder row, ext, diagonal, pallof - 3lb on pulley machine Wall stretch for pec - 3 x 20   PATIENT EDUCATION:  Education details: Access Code: [email or web address] Person educated: Patient Education method: Explanation, Verbal cues, and Handouts Education comprehension: verbalized understanding  HOME EXERCISE PROGRAM: Access Code: [email or web address] URL: [email or web address] Date: 02/21/2023 Prepared by: Dwana Curd  Exercises - Supine Transversus Abdominis Bracing - Hands on Thighs  - 1 x daily - 7 x weekly - 1 sets - 10 reps - 5 sec hold - Supine Diaphragmatic Breathing  - 3 x daily - 7 x weekly - 1 sets - 10 reps - Supine Bridge with Pelvic Floor Contraction  - 3 x daily - 7 x weekly - 1 sets - 10 reps - 3 sec hold - Seated Shoulder Flexion with Dumbbells  - 1 x daily - 7 x weekly - 3 sets - 10 reps - Seated Long Arc Quad with Hip Adduction  - 1 x daily - 7 x weekly - 3 sets - 10 reps  ASSESSMENT:  CLINICAL IMPRESSION: Today's session focused on exercises and mobility to promote decreased pressure into the pelvic floor.  Pt was given exercises and stretches to gain thoracic mobility and strength for  improved posture.  Pt needed cues for breathing, did well with "blow out the candles".   Pt will benefit from skilled PT to address these impairments for improved prolapse and bladder control and overall function.  OBJECTIVE IMPAIRMENTS: decreased coordination, decreased endurance, decreased ROM, decreased strength, increased muscle spasms, impaired flexibility, postural dysfunction, and pain.   ACTIVITY LIMITATIONS: lifting, standing, continence, and toileting  PARTICIPATION LIMITATIONS: interpersonal relationship and community activity  PERSONAL FACTORS: 3+ comorbidities: Partial hysterectomy, left knee surgery, IBS, diverticulosis, bladder tacked  are also affecting patient's functional outcome.   REHAB POTENTIAL: Excellent  CLINICAL DECISION MAKING: Evolving/moderate complexity  EVALUATION COMPLEXITY: Moderate   GOALS: Goals reviewed with patient? Yes  SHORT TERM GOALS: Target date: 03/12/23  Ind with initial HEP Baseline: Goal status: MET  2.  Ind with toileting technique Baseline:  Goal status: MET    LONG TERM GOALS: Target date: 05/07/23 Updated 04/04/23  Pt will be independent with advanced HEP to maintain improvements made throughout therapy  Baseline:  Goal status: IN PROGRESS  2.  Pt  will have 50% less urgency due to bowel/bladder retraining and strengthening  Baseline: if I don't put it off I don't have a problem Goal status: IN PROGRESS  3.  Pt will demonstrate 4+/5 bilateral hip strength for improved functional activities Baseline:  Goal status: IN PROGRESS  4.  Pt will have at least 50% less discomfort due to bladder prolapse Baseline: feels the same, but not worse even when was sick and vomiting a lot Goal status: IN PROGRESS    PLAN:  PT FREQUENCY: 1x/week  PT DURATION: 12 weeks  PLANNED INTERVENTIONS: Therapeutic exercises, Therapeutic activity, Neuromuscular re-education, Balance training, Gait training, Patient/Family education, Self Care,  Joint mobilization, Dry Needling, Electrical stimulation, Cryotherapy, Moist heat, Taping, Biofeedback, Manual therapy, and Re-evaluation  PLAN FOR NEXT SESSION: progress HEP as able, improved thoracic mobility and stretches, continue to build core strength with breathing coordination   [employer], PT 04/04/2023, 8:49 AM

## 2023-04-08 ENCOUNTER — Ambulatory Visit: Payer: Medicare Other | Admitting: Physical Therapy

## 2023-04-08 DIAGNOSIS — M6281 Muscle weakness (generalized): Secondary | ICD-10-CM | POA: Diagnosis not present

## 2023-04-08 DIAGNOSIS — R293 Abnormal posture: Secondary | ICD-10-CM | POA: Diagnosis not present

## 2023-04-08 DIAGNOSIS — R279 Unspecified lack of coordination: Secondary | ICD-10-CM | POA: Diagnosis not present

## 2023-04-08 NOTE — Therapy (Signed)
OUTPATIENT PHYSICAL THERAPY FEMALE PELVIC TREATMENT   Patient Name: Frances Brown MRN: [id number] DOB:03-29-1946, 77 y.o., female Today's Date: 04/08/2023  END OF SESSION:  PT End of Session - 04/08/23 0803     Visit Number 6    Date for PT Re-Evaluation 05/07/23    Authorization Type Medicare    PT Start Time 0800    PT Stop Time 0840    PT Time Calculation (min) 40 min    Activity Tolerance Patient tolerated treatment well    Behavior During Therapy WFL for tasks assessed/performed                  Past Medical History:  Diagnosis Date   Allergy    Anxiety    no meds    Cancer (HCC)    mole    Diabetes mellitus without complication (HCC)    Diverticulosis    Fibrocystic breast disease    Fundic gland polyps of stomach, benign 03/24/2015   GERD (gastroesophageal reflux disease)    Hx of colonic polyps 04/24/2017   Hyperlipidemia    Hypertension    IBS (irritable bowel syndrome)    Macular degeneration    vitreous hemorrhage, does injections in rt every 5 weeks   Obesity    Papilledema, both eyes 07/28/2018   Dr Octavia Heir   Tachycardia, unspecified 04/08/2017   pt. states she saw Dr. Alford Highland for this no meds no procedures, wore monitor   Urge incontinence    Past Surgical History:  Procedure Laterality Date   BREAST EXCISIONAL BIOPSY Left    over 15 years ( she thinks it may have been a cysts removal but there is a scar   breast lymph node removed     right   CHOLECYSTECTOMY     COLONOSCOPY  06/2002, 04/10/2007   diverticulosis, colon polyps, external hemorrhoids   KNEE ARTHROSCOPY     left   PARTIAL HYSTERECTOMY     POLYPECTOMY     SHOULDER SURGERY     right   UPPER GASTROINTESTINAL ENDOSCOPY  09/27/11   VIDEO BRONCHOSCOPY Bilateral 02/14/2015   Procedure: VIDEO BRONCHOSCOPY WITHOUT FLUORO;  Surgeon: Leslye Peer, MD;  Location: [facility];  Service: Cardiopulmonary;  Laterality: Bilateral;   WISDOM TOOTH EXTRACTION     Patient  Active Problem List   Diagnosis Date Noted   Pain in left hip 09/19/2021   Pain in right leg 07/15/2019   Gastroesophageal reflux disease 07/14/2018   Abdominal pain, left lower quadrant 07/09/2017   Hoarseness 03/11/2015   Cough 01/28/2014   HTN (hypertension) 12/27/2011   Hyperlipidemia 12/27/2011   Obesity 08/16/2011   IBS (irritable bowel syndrome) 05/23/2011   PALPITATIONS 12/21/2010   Chronic left upper quadrant pain 08/08/2010    PCP: Martha Clan, MD  REFERRING PROVIDER:   Iva Boop, MD    REFERRING DIAG:  R15.2 (ICD-10-CM) - Fecal urgency  R39.15 (ICD-10-CM) - Urinary urgency    THERAPY DIAG:  Muscle weakness (generalized)  Unspecified lack of coordination  Abnormal posture  Rationale for Evaluation and Treatment: Rehabilitation  ONSET DATE: bladder infections increasing for 5 years; partial hysterectomy in my 20s and bladder has been dropped since that time  SUBJECTIVE:  SUBJECTIVE STATEMENT: Pt states bowel movement are still loose and go three x per day depends what I eat.  Pt states still feeling  Fluid intake: Yes: try to drink water and with meals but maybe not enough    PAIN:  Are you having pain? No   PRECAUTIONS: None  WEIGHT BEARING RESTRICTIONS: No  FALLS:  Has patient fallen in last 6 months? No  LIVING ENVIRONMENT: Lives with: lives with their spouse Lives in: House/apartment   OCCUPATION:   PLOF: Independent  PATIENT GOALS: strengthen pelvic floor and see if it helps not have as many infections  PERTINENT HISTORY:  Partial hysterectomy, left knee surgery, IBS, diverticulosis, bladder tacked Sexual abuse: No  BOWEL MOVEMENT: Pain with bowel movement: Yes Type of bowel movement:Type (Bristol Stool Scale) can be watery, Frequency daily  up to 3x, and Strain No Fully empty rectum: Yes:   Leakage: No Pads: No Fiber supplement: No  URINATION: Pain with urination: Yes Fully empty bladder: No Stream:  sometimes weak Urgency: Yes:   Frequency: have to go right after drinking sometimes; nocturia 1-2/night depends on medication or if having an infection Leakage:  no Pads: No  INTERCOURSE: Pain with intercourse:  at times   PREGNANCY: Vaginal deliveries 2   PROLAPSE: Cystocele bulging out   OBJECTIVE:   DIAGNOSTIC FINDINGS:    PATIENT SURVEYS:    PFIQ-7   COGNITION: Overall cognitive status: Within functional limits for tasks assessed     SENSATION: Light touch:  Proprioception:   MUSCLE LENGTH: Hamstrings: Right 90 deg; Left 85 deg Thomas test:   LUMBAR SPECIAL TESTS:  Straight leg raise test: Negative  FUNCTIONAL TESTS:  Single leg stand normal  GAIT:  Comments: WFL   POSTURE: forward head and increased thoracic kyphosis  PELVIC ALIGNMENT: left anterior rotation  LUMBARAROM/PROM:  A/PROM A/PROM  eval  Flexion 80%  Extension   Right lateral flexion 80%  Left lateral flexion 100%  Right rotation   Left rotation    (Blank rows = not tested)  LOWER EXTREMITY ROM:  Passive ROM Right eval Left eval  Hip flexion [facility]   Hip extension    Hip abduction    Hip adduction    Hip internal rotation 50% 50%  Hip external rotation 75% 80%  Knee flexion    Knee extension    Ankle dorsiflexion    Ankle plantarflexion    Ankle inversion    Ankle eversion     (Blank rows = not tested)  LOWER EXTREMITY MMT: bilateral hip strength 4/5  MMT Right eval Left eval  Hip flexion    Hip extension    Hip abduction    Hip adduction    Hip internal rotation    Hip external rotation    Knee flexion    Knee extension    Ankle dorsiflexion    Ankle plantarflexion    Ankle inversion    Ankle eversion     PALPATION:   General  tight lumbar paraspinals                External  Perineal Exam palpation external to clothing to assess how they do a kegel - holding breathing and co-contracting - can do 2 reps and then not relaxing                             Internal Pelvic Floor NA - deferred due to pt request  Patient  confirms identification and approves PT to assess internal pelvic floor and treatment No  PELVIC MMT:   MMT eval  Vaginal   Internal Anal Sphincter   External Anal Sphincter   Puborectalis   Diastasis Recti   (Blank rows = not tested)        TONE: NA  PROLAPSE: Bladder per patient  TODAY'S TREATMENT:                                                                                                                              DATE: 04/04/23   NMRE: Breathing cues and posture cues with exercises  Exercise: Seated thoracic ext Seated horizontal abduction Sit to stand with green band around knees - exhale on exertion Supine bridge - 10x Supine ball squeeze and hold - 10x 3 sec Standing hip abduction - 10x each    PATIENT EDUCATION:  Education details: Access Code: [email or web address] Person educated: Patient Education method: Explanation, Verbal cues, and Handouts Education comprehension: verbalized understanding  HOME EXERCISE PROGRAM: Access Code: [email or web address] URL: [email or web address] Date: 02/21/2023 Prepared by: Dwana Curd  Exercises - Supine Transversus Abdominis Bracing - Hands on Thighs  - 1 x daily - 7 x weekly - 1 sets - 10 reps - 5 sec hold - Supine Diaphragmatic Breathing  - 3 x daily - 7 x weekly - 1 sets - 10 reps - Supine Bridge with Pelvic Floor Contraction  - 3 x daily - 7 x weekly - 1 sets - 10 reps - 3 sec hold - Seated Shoulder Flexion with Dumbbells  - 1 x daily - 7 x weekly - 3 sets - 10 reps - Seated Long Arc Quad with Hip Adduction  - 1 x daily - 7 x weekly - 3 sets - 10 reps  ASSESSMENT:  CLINICAL IMPRESSION: Today's session focused on exercise progression and mobility to promote decreased  pressure into the pelvic floor.  Pt was educated on holding kegel with hold.  Pt needed cues to prevent bulging abdomen.  Pt working on more functional movements today with exhale on exertion.   Pt needed cues for breathing, did well with "blow out the candles".   Pt will benefit from skilled PT to address these impairments for improved prolapse and bladder control and overall function.  OBJECTIVE IMPAIRMENTS: decreased coordination, decreased endurance, decreased ROM, decreased strength, increased muscle spasms, impaired flexibility, postural dysfunction, and pain.   ACTIVITY LIMITATIONS: lifting, standing, continence, and toileting  PARTICIPATION LIMITATIONS: interpersonal relationship and community activity  PERSONAL FACTORS: 3+ comorbidities: Partial hysterectomy, left knee surgery, IBS, diverticulosis, bladder tacked  are also affecting patient's functional outcome.   REHAB POTENTIAL: Excellent  CLINICAL DECISION MAKING: Evolving/moderate complexity  EVALUATION COMPLEXITY: Moderate   GOALS: Goals reviewed with patient? Yes  SHORT TERM GOALS: Target date: 03/12/23  Ind with initial HEP Baseline: Goal status: MET  2.  Ind with toileting technique Baseline:  Goal status: MET    LONG TERM GOALS: Target  date: 05/07/23 Updated 04/04/23  Pt will be independent with advanced HEP to maintain improvements made throughout therapy  Baseline:  Goal status: IN PROGRESS  2.  Pt will have 50% less urgency due to bowel/bladder retraining and strengthening  Baseline: if I don't put it off I don't have a problem Goal status: IN PROGRESS  3.  Pt will demonstrate 4+/5 bilateral hip strength for improved functional activities Baseline:  Goal status: IN PROGRESS  4.  Pt will have at least 50% less discomfort due to bladder prolapse Baseline: feels the same, but not worse even when was sick and vomiting a lot Goal status: IN PROGRESS    PLAN:  PT FREQUENCY: 1x/week  PT DURATION:  12 weeks  PLANNED INTERVENTIONS: Therapeutic exercises, Therapeutic activity, Neuromuscular re-education, Balance training, Gait training, Patient/Family education, Self Care, Joint mobilization, Dry Needling, Electrical stimulation, Cryotherapy, Moist heat, Taping, Biofeedback, Manual therapy, and Re-evaluation  PLAN FOR NEXT SESSION: progress HEP and likely d/c next   [employer], PT 04/08/2023, 8:07 AM

## 2023-04-18 ENCOUNTER — Ambulatory Visit: Payer: Medicare Other | Admitting: Physical Therapy

## 2023-04-19 ENCOUNTER — Encounter: Payer: Self-pay | Admitting: Physical Therapy

## 2023-04-19 ENCOUNTER — Ambulatory Visit: Payer: Medicare Other | Attending: Internal Medicine | Admitting: Physical Therapy

## 2023-04-19 DIAGNOSIS — R293 Abnormal posture: Secondary | ICD-10-CM | POA: Diagnosis not present

## 2023-04-19 DIAGNOSIS — R279 Unspecified lack of coordination: Secondary | ICD-10-CM

## 2023-04-19 DIAGNOSIS — M6281 Muscle weakness (generalized): Secondary | ICD-10-CM | POA: Diagnosis not present

## 2023-04-19 NOTE — Therapy (Signed)
OUTPATIENT PHYSICAL THERAPY FEMALE PELVIC TREATMENT   Patient Name: Frances Brown MRN: [id number] DOB:18-Feb-1946, 77 y.o., female Today's Date: 04/19/2023  END OF SESSION:  PT End of Session - 04/19/23 1018     Visit Number 7    Date for PT Re-Evaluation 05/07/23    Authorization Type Medicare    PT Start Time 1017    PT Stop Time 1057    PT Time Calculation (min) 40 min    Activity Tolerance Patient tolerated treatment well    Behavior During Therapy WFL for tasks assessed/performed                   Past Medical History:  Diagnosis Date   Allergy    Anxiety    no meds    Cancer (HCC)    mole    Diabetes mellitus without complication (HCC)    Diverticulosis    Fibrocystic breast disease    Fundic gland polyps of stomach, benign 03/24/2015   GERD (gastroesophageal reflux disease)    Hx of colonic polyps 04/24/2017   Hyperlipidemia    Hypertension    IBS (irritable bowel syndrome)    Macular degeneration    vitreous hemorrhage, does injections in rt every 5 weeks   Obesity    Papilledema, both eyes 07/28/2018   Dr Octavia Heir   Tachycardia, unspecified 04/08/2017   pt. states she saw Dr. Alford Highland for this no meds no procedures, wore monitor   Urge incontinence    Past Surgical History:  Procedure Laterality Date   BREAST EXCISIONAL BIOPSY Left    over 15 years ( she thinks it may have been a cysts removal but there is a scar   breast lymph node removed     right   CHOLECYSTECTOMY     COLONOSCOPY  06/2002, 04/10/2007   diverticulosis, colon polyps, external hemorrhoids   KNEE ARTHROSCOPY     left   PARTIAL HYSTERECTOMY     POLYPECTOMY     SHOULDER SURGERY     right   UPPER GASTROINTESTINAL ENDOSCOPY  09/27/11   VIDEO BRONCHOSCOPY Bilateral 02/14/2015   Procedure: VIDEO BRONCHOSCOPY WITHOUT FLUORO;  Surgeon: Leslye Peer, MD;  Location: [facility];  Service: Cardiopulmonary;  Laterality: Bilateral;   WISDOM TOOTH EXTRACTION     Patient  Active Problem List   Diagnosis Date Noted   Pain in left hip 09/19/2021   Pain in right leg 07/15/2019   Gastroesophageal reflux disease 07/14/2018   Abdominal pain, left lower quadrant 07/09/2017   Hoarseness 03/11/2015   Cough 01/28/2014   HTN (hypertension) 12/27/2011   Hyperlipidemia 12/27/2011   Obesity 08/16/2011   IBS (irritable bowel syndrome) 05/23/2011   PALPITATIONS 12/21/2010   Chronic left upper quadrant pain 08/08/2010    PCP: Martha Clan, MD  REFERRING PROVIDER:   Iva Boop, MD    REFERRING DIAG:  R15.2 (ICD-10-CM) - Fecal urgency  R39.15 (ICD-10-CM) - Urinary urgency    THERAPY DIAG:  Muscle weakness (generalized)  Unspecified lack of coordination  Abnormal posture  Rationale for Evaluation and Treatment: Rehabilitation  ONSET DATE: bladder infections increasing for 5 years; partial hysterectomy in my 20s and bladder has been dropped since that time  SUBJECTIVE:  SUBJECTIVE STATEMENT: Pt states feeling a little better.  Still has the same urge but as long as I don't wait too long.  Fluid intake: Yes: try to drink water and with meals but maybe not enough    PAIN:  Are you having pain? No   PRECAUTIONS: None  WEIGHT BEARING RESTRICTIONS: No  FALLS:  Has patient fallen in last 6 months? No  LIVING ENVIRONMENT: Lives with: lives with their spouse Lives in: House/apartment   OCCUPATION:   PLOF: Independent  PATIENT GOALS: strengthen pelvic floor and see if it helps not have as many infections  PERTINENT HISTORY:  Partial hysterectomy, left knee surgery, IBS, diverticulosis, bladder tacked Sexual abuse: No  BOWEL MOVEMENT: Pain with bowel movement: Yes Type of bowel movement:Type (Bristol Stool Scale) can be watery, Frequency daily up to 3x,  and Strain No Fully empty rectum: Yes:   Leakage: No Pads: No Fiber supplement: No  URINATION: Pain with urination: Yes Fully empty bladder: No Stream:  sometimes weak Urgency: Yes:   Frequency: have to go right after drinking sometimes; nocturia 1-2/night depends on medication or if having an infection Leakage:  no Pads: No  INTERCOURSE: Pain with intercourse:  at times   PREGNANCY: Vaginal deliveries 2   PROLAPSE: Cystocele bulging out   OBJECTIVE:   DIAGNOSTIC FINDINGS:    PATIENT SURVEYS:    PFIQ-7   COGNITION: Overall cognitive status: Within functional limits for tasks assessed     SENSATION: Light touch:  Proprioception:   MUSCLE LENGTH: Hamstrings: Right 90 deg; Left 85 deg Thomas test:   LUMBAR SPECIAL TESTS:  Straight leg raise test: Negative  FUNCTIONAL TESTS:  Single leg stand normal  GAIT:  Comments: WFL   POSTURE: forward head and increased thoracic kyphosis  PELVIC ALIGNMENT: left anterior rotation  LUMBARAROM/PROM:  A/PROM A/PROM  eval  Flexion 80%  Extension   Right lateral flexion 80%  Left lateral flexion 100%  Right rotation   Left rotation    (Blank rows = not tested)  LOWER EXTREMITY ROM:  Passive ROM Right eval Left eval  Hip flexion [facility]   Hip extension    Hip abduction    Hip adduction    Hip internal rotation 50% 50%  Hip external rotation 75% 80%  Knee flexion    Knee extension    Ankle dorsiflexion    Ankle plantarflexion    Ankle inversion    Ankle eversion     (Blank rows = not tested)  LOWER EXTREMITY MMT: bilateral hip strength 4/5 (eval   PALPATION:   General  tight lumbar paraspinals                External Perineal Exam palpation external to clothing to assess how they do a kegel - holding breathing and co-contracting - can do 2 reps and then not relaxing                             Internal Pelvic Floor NA - deferred due to pt request  Patient confirms identification and  approves PT to assess internal pelvic floor and treatment No  PELVIC MMT:   MMT eval  Vaginal   Internal Anal Sphincter   External Anal Sphincter   Puborectalis   Diastasis Recti   (Blank rows = not tested)        TONE: NA  PROLAPSE: Bladder per patient  TODAY'S TREATMENT:  DATE: 04/19/23   NMRE: Breathing cues and posture cues with exercises  Row 7lb pulley - 20x Pball rolling up the wall overhead - 20x Wall push up with pball 20x Side step 7lb - 15x bil H/s stretch bil Nustep L5 x 5 min  PATIENT EDUCATION:  Education details: Access Code: [email or web address] Person educated: Patient Education method: Explanation, Verbal cues, and Handouts Education comprehension: verbalized understanding  HOME EXERCISE PROGRAM: Access Code: [email or web address] URL: [email or web address] Date: 02/21/2023 Prepared by: Dwana Curd  Exercises - Supine Transversus Abdominis Bracing - Hands on Thighs  - 1 x daily - 7 x weekly - 1 sets - 10 reps - 5 sec hold - Supine Diaphragmatic Breathing  - 3 x daily - 7 x weekly - 1 sets - 10 reps - Supine Bridge with Pelvic Floor Contraction  - 3 x daily - 7 x weekly - 1 sets - 10 reps - 3 sec hold - Seated Shoulder Flexion with Dumbbells  - 1 x daily - 7 x weekly - 3 sets - 10 reps - Seated Long Arc Quad with Hip Adduction  - 1 x daily - 7 x weekly - 3 sets - 10 reps  ASSESSMENT:  CLINICAL IMPRESSION: Pt does not want to try anything as far as internal assessment of the pelvic floor.  Pt feels like she is getting a little better currently and wants to see if that will continue on her own at this time.  Pt will d/c from skilled PT with HEP today.  OBJECTIVE IMPAIRMENTS: decreased coordination, decreased endurance, decreased ROM, decreased strength, increased muscle spasms, impaired flexibility, postural dysfunction, and  pain.   ACTIVITY LIMITATIONS: lifting, standing, continence, and toileting  PARTICIPATION LIMITATIONS: interpersonal relationship and community activity  PERSONAL FACTORS: 3+ comorbidities: Partial hysterectomy, left knee surgery, IBS, diverticulosis, bladder tacked  are also affecting patient's functional outcome.   REHAB POTENTIAL: Excellent  CLINICAL DECISION MAKING: Evolving/moderate complexity  EVALUATION COMPLEXITY: Moderate   GOALS: Goals reviewed with patient? Yes  SHORT TERM GOALS: Target date: 03/12/23  Ind with initial HEP Baseline: Goal status: MET  2.  Ind with toileting technique Baseline:  Goal status: MET    LONG TERM GOALS: Target date: 05/07/23 Updated 04/19/23  Pt will be independent with advanced HEP to maintain improvements made throughout therapy  Baseline:  Goal status: MET  2.  Pt will have 50% less urgency due to bowel/bladder retraining and strengthening  Baseline: it is okay as long as I don't put it off Goal status: MET  3.  Pt will demonstrate 4+/5 bilateral hip strength for improved functional activities Baseline: 4+/5 Rt LE; 4/5 Lt LE Goal status: NOT MET  4.  Pt will have at least 50% less discomfort due to bladder prolapse Baseline: feels the same, maybe a little better because not having pain now Goal status: MET    PLAN:  PT FREQUENCY: 1x/week  PT DURATION: 12 weeks  PLANNED INTERVENTIONS: Therapeutic exercises, Therapeutic activity, Neuromuscular re-education, Balance training, Gait training, Patient/Family education, Self Care, Joint mobilization, Dry Needling, Electrical stimulation, Cryotherapy, Moist heat, Taping, Biofeedback, Manual therapy, and Re-evaluation  PLAN FOR NEXT SESSION: d/c today   Junious Silk, PT 04/19/2023, 10:51 AM   PHYSICAL THERAPY DISCHARGE SUMMARY  Visits from Start of Care: 7  Current functional level related to goals / functional outcomes: See above goals   Remaining deficits: See  above   Education / Equipment: HEP   Patient agrees to discharge. Patient goals were partially met. Patient is  being discharged due to being pleased with the current functional level.  Russella Dar, PT, DPT 04/19/23 10:51 AM

## 2023-04-25 DIAGNOSIS — N1832 Chronic kidney disease, stage 3b: Secondary | ICD-10-CM | POA: Diagnosis not present

## 2023-04-25 DIAGNOSIS — E1139 Type 2 diabetes mellitus with other diabetic ophthalmic complication: Secondary | ICD-10-CM | POA: Diagnosis not present

## 2023-04-25 DIAGNOSIS — G932 Benign intracranial hypertension: Secondary | ICD-10-CM | POA: Diagnosis not present

## 2023-04-25 DIAGNOSIS — G629 Polyneuropathy, unspecified: Secondary | ICD-10-CM | POA: Diagnosis not present

## 2023-04-25 DIAGNOSIS — E781 Pure hyperglyceridemia: Secondary | ICD-10-CM | POA: Diagnosis not present

## 2023-04-25 DIAGNOSIS — E1129 Type 2 diabetes mellitus with other diabetic kidney complication: Secondary | ICD-10-CM | POA: Diagnosis not present

## 2023-04-25 DIAGNOSIS — H353210 Exudative age-related macular degeneration, right eye, stage unspecified: Secondary | ICD-10-CM | POA: Diagnosis not present

## 2023-04-25 DIAGNOSIS — I129 Hypertensive chronic kidney disease with stage 1 through stage 4 chronic kidney disease, or unspecified chronic kidney disease: Secondary | ICD-10-CM | POA: Diagnosis not present

## 2023-04-25 DIAGNOSIS — I700 Atherosclerosis of aorta: Secondary | ICD-10-CM | POA: Diagnosis not present

## 2023-04-25 DIAGNOSIS — M8580 Other specified disorders of bone density and structure, unspecified site: Secondary | ICD-10-CM | POA: Diagnosis not present

## 2023-04-25 DIAGNOSIS — E785 Hyperlipidemia, unspecified: Secondary | ICD-10-CM | POA: Diagnosis not present

## 2023-05-09 DIAGNOSIS — E1129 Type 2 diabetes mellitus with other diabetic kidney complication: Secondary | ICD-10-CM | POA: Diagnosis not present

## 2023-05-09 DIAGNOSIS — M79645 Pain in left finger(s): Secondary | ICD-10-CM | POA: Diagnosis not present

## 2023-05-09 DIAGNOSIS — E1139 Type 2 diabetes mellitus with other diabetic ophthalmic complication: Secondary | ICD-10-CM | POA: Diagnosis not present

## 2023-05-09 DIAGNOSIS — M79675 Pain in left toe(s): Secondary | ICD-10-CM | POA: Diagnosis not present

## 2023-05-16 ENCOUNTER — Ambulatory Visit: Payer: Medicare Other | Admitting: Orthopaedic Surgery

## 2023-05-17 ENCOUNTER — Other Ambulatory Visit (INDEPENDENT_AMBULATORY_CARE_PROVIDER_SITE_OTHER): Payer: Medicare Other

## 2023-05-17 ENCOUNTER — Ambulatory Visit (INDEPENDENT_AMBULATORY_CARE_PROVIDER_SITE_OTHER): Payer: Medicare Other | Admitting: Orthopaedic Surgery

## 2023-05-17 DIAGNOSIS — M79672 Pain in left foot: Secondary | ICD-10-CM

## 2023-05-17 DIAGNOSIS — M79642 Pain in left hand: Secondary | ICD-10-CM

## 2023-05-17 NOTE — Progress Notes (Signed)
Office Visit Note   Patient: Frances Brown           Date of Birth: 20-Jul-1946           MRN: [id number] Visit Date: 05/17/2023              Requested by: Cleatis Polka., MD [address] [place],  [place] [address] PCP: Cleatis Polka., MD   Assessment & Plan: Visit Diagnoses:  1. Pain in left hand   2. Pain in left foot     Plan: Impression is thumb MCP arthritis and left hallux rigidus.  Disease process reviewed and treatment options were explained.  We talked about a carbon insert and activity modification.  Will give her a prescription for custom insert for arch support and hallux rigidus to [facility].  Over-the-counter medications as needed.  Follow-Up Instructions: No follow-ups on file.   Orders:  Orders Placed This Encounter  Procedures   XR Hand Complete Left   XR Foot Complete Left   No orders of the defined types were placed in this encounter.     Procedures: No procedures performed   Clinical Data: No additional findings.   Subjective: Chief Complaint  Patient presents with   Left Foot - Pain   Left Hand - Pain    HPI Frances Brown is a 77 year old female here for evaluation of left thumb pain and left great toe pain.  Denies any injuries.  Denies history of gout.  Pain is worse with use of the left hand and with walking. Review of Systems  Constitutional: Negative.   HENT: Negative.    Eyes: Negative.   Respiratory: Negative.    Cardiovascular: Negative.   Endocrine: Negative.   Musculoskeletal: Negative.   Neurological: Negative.   Hematological: Negative.   Psychiatric/Behavioral: Negative.    All other systems reviewed and are negative.    Objective: Vital Signs: There were no vitals taken for this visit.  Physical Exam Vitals and nursing note reviewed.  Constitutional:      Appearance: She is well-developed.  HENT:     Head: Atraumatic.     Nose: Nose normal.  Eyes:     Extraocular Movements: Extraocular movements intact.   Cardiovascular:     Pulses: Normal pulses.  Pulmonary:     Effort: Pulmonary effort is normal.  Abdominal:     Palpations: Abdomen is soft.  Musculoskeletal:     Cervical back: Neck supple.  Skin:    General: Skin is warm.     Capillary Refill: Capillary refill takes less than 2 seconds.  Neurological:     Mental Status: She is alert. Mental status is at baseline.  Psychiatric:        Behavior: Behavior normal.        Thought Content: Thought content normal.        Judgment: Judgment normal.     Ortho Exam Examination of the left thumb shows pain with grind test of the MCP joint.  Slight limitation range of motion and moderate pain.  There is no triggering.  CMC grind test does not produce pain.  Examination of the left great toe shows tenderness to the lateral side of the MTP joint.  Pain and crepitus with grind test. Specialty Comments:  No specialty comments available.  Imaging: No results found.   PMFS History: Patient Active Problem List   Diagnosis Date Noted   Pain in left hip 09/19/2021   Pain in right leg 07/15/2019  Gastroesophageal reflux disease 07/14/2018   Abdominal pain, left lower quadrant 07/09/2017   Hoarseness 03/11/2015   Cough 01/28/2014   HTN (hypertension) 12/27/2011   Hyperlipidemia 12/27/2011   Obesity 08/16/2011   IBS (irritable bowel syndrome) 05/23/2011   PALPITATIONS 12/21/2010   Chronic left upper quadrant pain 08/08/2010   Past Medical History:  Diagnosis Date   Allergy    Anxiety    no meds    Cancer (HCC)    mole    Diabetes mellitus without complication (HCC)    Diverticulosis    Fibrocystic breast disease    Fundic gland polyps of stomach, benign 03/24/2015   GERD (gastroesophageal reflux disease)    Hx of colonic polyps 04/24/2017   Hyperlipidemia    Hypertension    IBS (irritable bowel syndrome)    Macular degeneration    vitreous hemorrhage, does injections in rt every 5 weeks   Obesity    Papilledema, both  eyes 07/28/2018   Dr Octavia Heir   Tachycardia, unspecified 04/08/2017   pt. states she saw Dr. Alford Highland for this no meds no procedures, wore monitor   Urge incontinence     Family History  Problem Relation Age of Onset   Diabetes Father    Heart failure Father    Hypertension Mother    Heart failure Mother    Diabetes Mother    Lung cancer Mother    Stroke Mother    Leukemia Other        grandfather   Cirrhosis Other        grandmother   Hypertension Sister    Colon cancer Maternal Uncle 41   Breast cancer Maternal Aunt    Colon polyps Neg Hx    Esophageal cancer Neg Hx    Rectal cancer Neg Hx    Stomach cancer Neg Hx     Past Surgical History:  Procedure Laterality Date   BREAST EXCISIONAL BIOPSY Left    over 15 years ( she thinks it may have been a cysts removal but there is a scar   breast lymph node removed     right   CHOLECYSTECTOMY     COLONOSCOPY  06/2002, 04/10/2007   diverticulosis, colon polyps, external hemorrhoids   KNEE ARTHROSCOPY     left   PARTIAL HYSTERECTOMY     POLYPECTOMY     SHOULDER SURGERY     right   UPPER GASTROINTESTINAL ENDOSCOPY  09/27/11   VIDEO BRONCHOSCOPY Bilateral 02/14/2015   Procedure: VIDEO BRONCHOSCOPY WITHOUT FLUORO;  Surgeon: Leslye Peer, MD;  Location: [facility];  Service: Cardiopulmonary;  Laterality: Bilateral;   WISDOM TOOTH EXTRACTION     Social History   Occupational History    Employer: RETIRED  Tobacco Use   Smoking status: Never   Smokeless tobacco: Never  Vaping Use   Vaping Use: Never used  Substance and Sexual Activity   Alcohol use: No    Alcohol/week: 0.0 standard drinks of alcohol   Drug use: No   Sexual activity: Yes    Birth control/protection: Post-menopausal

## 2023-05-28 DIAGNOSIS — H31013 Macula scars of posterior pole (postinflammatory) (post-traumatic), bilateral: Secondary | ICD-10-CM | POA: Diagnosis not present

## 2023-05-28 DIAGNOSIS — H353211 Exudative age-related macular degeneration, right eye, with active choroidal neovascularization: Secondary | ICD-10-CM | POA: Diagnosis not present

## 2023-05-28 DIAGNOSIS — H353123 Nonexudative age-related macular degeneration, left eye, advanced atrophic without subfoveal involvement: Secondary | ICD-10-CM | POA: Diagnosis not present

## 2023-05-28 DIAGNOSIS — E113593 Type 2 diabetes mellitus with proliferative diabetic retinopathy without macular edema, bilateral: Secondary | ICD-10-CM | POA: Diagnosis not present

## 2023-07-03 DIAGNOSIS — D225 Melanocytic nevi of trunk: Secondary | ICD-10-CM | POA: Diagnosis not present

## 2023-07-03 DIAGNOSIS — L821 Other seborrheic keratosis: Secondary | ICD-10-CM | POA: Diagnosis not present

## 2023-07-03 DIAGNOSIS — H61031 Chondritis of right external ear: Secondary | ICD-10-CM | POA: Diagnosis not present

## 2023-07-03 DIAGNOSIS — L814 Other melanin hyperpigmentation: Secondary | ICD-10-CM | POA: Diagnosis not present

## 2023-07-03 DIAGNOSIS — L853 Xerosis cutis: Secondary | ICD-10-CM | POA: Diagnosis not present

## 2023-07-03 DIAGNOSIS — L738 Other specified follicular disorders: Secondary | ICD-10-CM | POA: Diagnosis not present

## 2023-07-11 DIAGNOSIS — N1832 Chronic kidney disease, stage 3b: Secondary | ICD-10-CM | POA: Diagnosis not present

## 2023-07-11 DIAGNOSIS — G720 Drug-induced myopathy: Secondary | ICD-10-CM | POA: Diagnosis not present

## 2023-07-11 DIAGNOSIS — G43909 Migraine, unspecified, not intractable, without status migrainosus: Secondary | ICD-10-CM | POA: Diagnosis not present

## 2023-07-11 DIAGNOSIS — I129 Hypertensive chronic kidney disease with stage 1 through stage 4 chronic kidney disease, or unspecified chronic kidney disease: Secondary | ICD-10-CM | POA: Diagnosis not present

## 2023-07-11 DIAGNOSIS — Z20828 Contact with and (suspected) exposure to other viral communicable diseases: Secondary | ICD-10-CM | POA: Diagnosis not present

## 2023-07-11 DIAGNOSIS — E785 Hyperlipidemia, unspecified: Secondary | ICD-10-CM | POA: Diagnosis not present

## 2023-07-11 DIAGNOSIS — E1129 Type 2 diabetes mellitus with other diabetic kidney complication: Secondary | ICD-10-CM | POA: Diagnosis not present

## 2023-07-24 DIAGNOSIS — N1832 Chronic kidney disease, stage 3b: Secondary | ICD-10-CM | POA: Diagnosis not present

## 2023-07-24 DIAGNOSIS — I129 Hypertensive chronic kidney disease with stage 1 through stage 4 chronic kidney disease, or unspecified chronic kidney disease: Secondary | ICD-10-CM | POA: Diagnosis not present

## 2023-07-24 DIAGNOSIS — U071 COVID-19: Secondary | ICD-10-CM | POA: Diagnosis not present

## 2023-07-24 DIAGNOSIS — J069 Acute upper respiratory infection, unspecified: Secondary | ICD-10-CM | POA: Diagnosis not present

## 2023-07-24 DIAGNOSIS — R051 Acute cough: Secondary | ICD-10-CM | POA: Diagnosis not present

## 2023-08-08 DIAGNOSIS — H04123 Dry eye syndrome of bilateral lacrimal glands: Secondary | ICD-10-CM | POA: Diagnosis not present

## 2023-08-14 ENCOUNTER — Encounter: Payer: Self-pay | Admitting: Cardiovascular Disease

## 2023-08-14 ENCOUNTER — Ambulatory Visit: Payer: Medicare Other | Attending: Cardiovascular Disease | Admitting: Cardiovascular Disease

## 2023-08-14 VITALS — BP 108/62 | HR 67 | Ht 64.0 in | Wt 162.4 lb

## 2023-08-14 DIAGNOSIS — H353123 Nonexudative age-related macular degeneration, left eye, advanced atrophic without subfoveal involvement: Secondary | ICD-10-CM | POA: Diagnosis not present

## 2023-08-14 DIAGNOSIS — I491 Atrial premature depolarization: Secondary | ICD-10-CM | POA: Diagnosis not present

## 2023-08-14 DIAGNOSIS — E113593 Type 2 diabetes mellitus with proliferative diabetic retinopathy without macular edema, bilateral: Secondary | ICD-10-CM | POA: Diagnosis not present

## 2023-08-14 DIAGNOSIS — I10 Essential (primary) hypertension: Secondary | ICD-10-CM | POA: Diagnosis not present

## 2023-08-14 DIAGNOSIS — H353211 Exudative age-related macular degeneration, right eye, with active choroidal neovascularization: Secondary | ICD-10-CM | POA: Diagnosis not present

## 2023-08-14 DIAGNOSIS — I340 Nonrheumatic mitral (valve) insufficiency: Secondary | ICD-10-CM | POA: Insufficient documentation

## 2023-08-14 DIAGNOSIS — H31013 Macula scars of posterior pole (postinflammatory) (post-traumatic), bilateral: Secondary | ICD-10-CM | POA: Diagnosis not present

## 2023-08-14 NOTE — Progress Notes (Signed)
Chief Complaint  Patient presents with   Follow-up    SVT   History of Present Illness: 77 yo female with history of SVT, DM, HTN, HLD and palpitations felt to be due to PACs who is here today for follow up. I saw her in 2012 for evaluation of palpitations and leg pain in the setting of steroid use for poison ivy treatment. I arranged a 48 Holter monitor and an echo. Her echo showed normal LV size and function. Her Holter monitor showed occasional premature atrial contractions but no VT or atrial fibrillation. I saw her in 2018 and she had c/o more frequent palpitations. Cardiac monitor April 2018 with PACs and several short runs of SVT. Echo August 2021 with LVEF=60-65%. Mild mitral regurgitation.  She is here today for follow up. The patient denies any chest pain, dyspnea, palpitations, lower extremity edema, orthopnea, PND, dizziness, near syncope or syncope.   Primary Care Physician: Cleatis Polka., MD  Past Medical History:  Diagnosis Date   Allergy    Anxiety    no meds    Cancer [facility])    mole    Diabetes mellitus without complication (HCC)    Diverticulosis    Fibrocystic breast disease    Fundic gland polyps of stomach, benign 03/24/2015   GERD (gastroesophageal reflux disease)    Hx of colonic polyps 04/24/2017   Hyperlipidemia    Hypertension    IBS (irritable bowel syndrome)    Macular degeneration    vitreous hemorrhage, does injections in rt every 5 weeks   Obesity    Papilledema, both eyes 07/28/2018   Dr Octavia Heir   Tachycardia, unspecified 04/08/2017   pt. states she saw Dr. Alford Highland for this no meds no procedures, wore monitor   Urge incontinence     Past Surgical History:  Procedure Laterality Date   BREAST EXCISIONAL BIOPSY Left    over 15 years ( she thinks it may have been a cysts removal but there is a scar   breast lymph node removed     right   CHOLECYSTECTOMY     COLONOSCOPY  06/2002, 04/10/2007   diverticulosis, colon polyps,  external hemorrhoids   KNEE ARTHROSCOPY     left   PARTIAL HYSTERECTOMY     POLYPECTOMY     SHOULDER SURGERY     right   UPPER GASTROINTESTINAL ENDOSCOPY  09/27/11   VIDEO BRONCHOSCOPY Bilateral 02/14/2015   Procedure: VIDEO BRONCHOSCOPY WITHOUT FLUORO;  Surgeon: Leslye Peer, MD;  Location: [facility];  Service: Cardiopulmonary;  Laterality: Bilateral;   WISDOM TOOTH EXTRACTION      Current Outpatient Medications  Medication Sig Dispense Refill   bisoprolol (ZEBETA) 10 MG tablet Take 10 mg by mouth daily.     cholecalciferol (VITAMIN D) 1000 units tablet Take 1,000 Units by mouth daily.     Coenzyme Q10 (COQ10) 100 MG CAPS Take 1 capsule by mouth daily.     fenofibrate 54 MG tablet Take 54 mg by mouth daily.     metFORMIN (GLUCOPHAGE-XR) 500 MG 24 hr tablet Take 500 mg by mouth daily with breakfast.     Multiple Vitamins-Minerals (PRESERVISION AREDS 2 PO) Take 1 tablet by mouth daily.     olmesartan (BENICAR) 40 MG tablet Take 1 tablet by mouth daily.     omeprazole (PRILOSEC) 20 MG capsule Take 20 mg by mouth daily.     rosuvastatin (CRESTOR) 10 MG tablet Patient takes 3 times a week.  TOUJEO SOLOSTAR 300 UNIT/ML Solostar Pen Inject into the skin.     vitamin B-12 (CYANOCOBALAMIN) 1000 MCG tablet Take 1,000 mcg by mouth daily.     No current facility-administered medications for this visit.    Allergies  Allergen Reactions   Janumet Xr [Sitagliptin-Metformin Hcl Er] Swelling   Tricor [Fenofibrate] Swelling   Zocor [Simvastatin] Swelling   Lovaza [Omega-3-Acid Ethyl Esters] Other (See Comments)    GI intolerance   Sulfa Antibiotics    Trimethoprim     Rash / hives that are painful   Elemental Sulfur Rash    Social History   Socioeconomic History   Marital status: Married    Spouse name: Not on file   Number of children: 2   Years of education: Not on file   Highest education level: Not on file  Occupational History    Employer: RETIRED  Tobacco Use    Smoking status: Never   Smokeless tobacco: Never  Vaping Use   Vaping status: Never Used  Substance and Sexual Activity   Alcohol use: No    Alcohol/week: 0.0 standard drinks of alcohol   Drug use: No   Sexual activity: Yes    Birth control/protection: Post-menopausal  Other Topics Concern   Not on file  Social History Narrative   Has one great Teacher, early years/pre, Lawyer.  Lives home with husband.  Has 2 children.  Caffeine rare.     Enjoys gardening   Social Determinants of Corporate investment banker Strain: Not on file  Food Insecurity: Not on file  Transportation Needs: Not on file  Physical Activity: Not on file  Stress: Not on file  Social Connections: Not on file  Intimate Partner Violence: Not on file    Family History  Problem Relation Age of Onset   Diabetes Father    Heart failure Father    Hypertension Mother    Heart failure Mother    Diabetes Mother    Lung cancer Mother    Stroke Mother    Leukemia Other        grandfather   Cirrhosis Other        grandmother   Hypertension Sister    Colon cancer Maternal Uncle 12   Breast cancer Maternal Aunt    Colon polyps Neg Hx    Esophageal cancer Neg Hx    Rectal cancer Neg Hx    Stomach cancer Neg Hx     Review of Systems:  As stated in the HPI and otherwise negative.   BP 108/62   Pulse 67   Ht 5\' 4"  (1.626 m)   Wt 73.7 kg   SpO2 98%   BMI 27.88 kg/m   Physical Examination: General: Well developed, well nourished, NAD  HEENT: OP clear, mucus membranes moist  SKIN: warm, dry. No rashes. Neuro: No focal deficits  Musculoskeletal: Muscle strength 5/5 all ext  Psychiatric: Mood and affect normal  Neck: No JVD, no carotid bruits, no thyromegaly, no lymphadenopathy.  Lungs:Clear bilaterally, no wheezes, rhonci, crackles Cardiovascular: Regular rate and rhythm. No murmurs, gallops or rubs. Abdomen:Soft. Bowel sounds present. Non-tender.  Extremities: No lower extremity  edema. Pulses are 2 + in the bilateral DP/PT.  Echo August 2021:  1. Left ventricular ejection fraction, by estimation, is 60 to 65%. The  left ventricle has normal function. The left ventricle has no regional  wall motion abnormalities. There is mild left ventricular hypertrophy of  the basal-septal segment. Left  ventricular  diastolic parameters are consistent with Grade I diastolic  dysfunction (impaired relaxation). Elevated left ventricular end-diastolic  pressure.   2. Right ventricular systolic function is normal. The right ventricular  size is normal. Tricuspid regurgitation signal is inadequate for assessing  PA pressure.   3. The mitral valve is normal in structure. Mild mitral valve  regurgitation. No evidence of mitral stenosis.   4. The aortic valve is tricuspid. Aortic valve regurgitation is not  visualized. Mild aortic valve sclerosis is present, with no evidence of  aortic valve stenosis.   5. The inferior vena cava is normal in size with greater than 50%  respiratory variability, suggesting right atrial pressure of 3 mmHg.   EKG:  EKG is ordered today. The ekg ordered today demonstrates  EKG Interpretation Date/Time:  Wednesday August 14 2023 15:10:53 EDT Ventricular Rate:  67 PR Interval:  150 QRS Duration:  80 QT Interval:  394 QTC Calculation: 416 R Axis:   62  Text Interpretation: Normal sinus rhythm Normal ECG No previous ECGs available Confirmed by Verne Carrow [phone]) on 08/14/2023 3:12:28 PM    Recent Labs: No results found for requested labs within last 365 days.    Wt Readings from Last 3 Encounters:  08/14/23 73.7 kg  02/08/23 74 kg  04/26/22 74.3 kg   Assessment and Plan:   1. PALPITATIONS/PAC/Paroxysmal SVT:  Echo in 2021 with normal LV size and function. No palpitations. Sinus on EKG today. Continue beta blocker.   2. HTN: BP is well controlled. No changes today.   3. Mitral regurgitation: Mild by echo in 2021. Repeat echo in  2025.      Labs/ tests ordered today include:   Orders Placed This Encounter  Procedures   EKG 12-Lead   Disposition:   F/U with me in 12  months  Signed, Verne Carrow, MD 08/14/2023 3:51 PM    [facility] [address] [place], [place], [place]  [address] Phone: [phone]; Fax: [phone]

## 2023-08-14 NOTE — Patient Instructions (Signed)

## 2023-09-05 DIAGNOSIS — E1129 Type 2 diabetes mellitus with other diabetic kidney complication: Secondary | ICD-10-CM | POA: Diagnosis not present

## 2023-09-05 DIAGNOSIS — E785 Hyperlipidemia, unspecified: Secondary | ICD-10-CM | POA: Diagnosis not present

## 2023-09-05 DIAGNOSIS — E1139 Type 2 diabetes mellitus with other diabetic ophthalmic complication: Secondary | ICD-10-CM | POA: Diagnosis not present

## 2023-09-05 DIAGNOSIS — I10 Essential (primary) hypertension: Secondary | ICD-10-CM | POA: Diagnosis not present

## 2023-09-05 DIAGNOSIS — R82998 Other abnormal findings in urine: Secondary | ICD-10-CM | POA: Diagnosis not present

## 2023-09-05 DIAGNOSIS — K219 Gastro-esophageal reflux disease without esophagitis: Secondary | ICD-10-CM | POA: Diagnosis not present

## 2023-09-05 DIAGNOSIS — Z0001 Encounter for general adult medical examination with abnormal findings: Secondary | ICD-10-CM | POA: Diagnosis not present

## 2023-09-05 DIAGNOSIS — I129 Hypertensive chronic kidney disease with stage 1 through stage 4 chronic kidney disease, or unspecified chronic kidney disease: Secondary | ICD-10-CM | POA: Diagnosis not present

## 2023-09-06 DIAGNOSIS — R3915 Urgency of urination: Secondary | ICD-10-CM | POA: Diagnosis not present

## 2023-09-06 DIAGNOSIS — N3020 Other chronic cystitis without hematuria: Secondary | ICD-10-CM | POA: Diagnosis not present

## 2023-09-06 DIAGNOSIS — N393 Stress incontinence (female) (male): Secondary | ICD-10-CM | POA: Diagnosis not present

## 2023-09-06 DIAGNOSIS — N952 Postmenopausal atrophic vaginitis: Secondary | ICD-10-CM | POA: Diagnosis not present

## 2023-09-09 ENCOUNTER — Ambulatory Visit: Payer: Medicare Other | Admitting: Cardiovascular Disease

## 2023-09-12 DIAGNOSIS — M8580 Other specified disorders of bone density and structure, unspecified site: Secondary | ICD-10-CM | POA: Diagnosis not present

## 2023-09-12 DIAGNOSIS — E781 Pure hyperglyceridemia: Secondary | ICD-10-CM | POA: Diagnosis not present

## 2023-09-12 DIAGNOSIS — H353210 Exudative age-related macular degeneration, right eye, stage unspecified: Secondary | ICD-10-CM | POA: Diagnosis not present

## 2023-09-12 DIAGNOSIS — K529 Noninfective gastroenteritis and colitis, unspecified: Secondary | ICD-10-CM | POA: Diagnosis not present

## 2023-09-12 DIAGNOSIS — Z1339 Encounter for screening examination for other mental health and behavioral disorders: Secondary | ICD-10-CM | POA: Diagnosis not present

## 2023-09-12 DIAGNOSIS — E1139 Type 2 diabetes mellitus with other diabetic ophthalmic complication: Secondary | ICD-10-CM | POA: Diagnosis not present

## 2023-09-12 DIAGNOSIS — Z0000 Encounter for general adult medical examination without abnormal findings: Secondary | ICD-10-CM | POA: Diagnosis not present

## 2023-09-12 DIAGNOSIS — G629 Polyneuropathy, unspecified: Secondary | ICD-10-CM | POA: Diagnosis not present

## 2023-09-12 DIAGNOSIS — I700 Atherosclerosis of aorta: Secondary | ICD-10-CM | POA: Diagnosis not present

## 2023-09-12 DIAGNOSIS — E785 Hyperlipidemia, unspecified: Secondary | ICD-10-CM | POA: Diagnosis not present

## 2023-09-12 DIAGNOSIS — R82998 Other abnormal findings in urine: Secondary | ICD-10-CM | POA: Diagnosis not present

## 2023-09-12 DIAGNOSIS — Z1331 Encounter for screening for depression: Secondary | ICD-10-CM | POA: Diagnosis not present

## 2023-09-12 DIAGNOSIS — N1832 Chronic kidney disease, stage 3b: Secondary | ICD-10-CM | POA: Diagnosis not present

## 2023-09-12 DIAGNOSIS — Z23 Encounter for immunization: Secondary | ICD-10-CM | POA: Diagnosis not present

## 2023-09-12 DIAGNOSIS — I129 Hypertensive chronic kidney disease with stage 1 through stage 4 chronic kidney disease, or unspecified chronic kidney disease: Secondary | ICD-10-CM | POA: Diagnosis not present

## 2023-09-12 DIAGNOSIS — E1129 Type 2 diabetes mellitus with other diabetic kidney complication: Secondary | ICD-10-CM | POA: Diagnosis not present

## 2023-10-23 DIAGNOSIS — Z124 Encounter for screening for malignant neoplasm of cervix: Secondary | ICD-10-CM | POA: Diagnosis not present

## 2023-10-23 DIAGNOSIS — Z01419 Encounter for gynecological examination (general) (routine) without abnormal findings: Secondary | ICD-10-CM | POA: Diagnosis not present

## 2023-11-11 ENCOUNTER — Ambulatory Visit: Payer: Medicare Other

## 2023-11-13 DIAGNOSIS — E113593 Type 2 diabetes mellitus with proliferative diabetic retinopathy without macular edema, bilateral: Secondary | ICD-10-CM | POA: Diagnosis not present

## 2023-11-13 DIAGNOSIS — H353212 Exudative age-related macular degeneration, right eye, with inactive choroidal neovascularization: Secondary | ICD-10-CM | POA: Diagnosis not present

## 2023-11-13 DIAGNOSIS — H31013 Macula scars of posterior pole (postinflammatory) (post-traumatic), bilateral: Secondary | ICD-10-CM | POA: Diagnosis not present

## 2023-11-13 DIAGNOSIS — H353123 Nonexudative age-related macular degeneration, left eye, advanced atrophic without subfoveal involvement: Secondary | ICD-10-CM | POA: Diagnosis not present

## 2023-11-18 ENCOUNTER — Ambulatory Visit
Admission: RE | Admit: 2023-11-18 | Discharge: 2023-11-18 | Disposition: A | Discharge status: Home / Self Care | Payer: Medicare Other | Source: Ambulatory Visit | Attending: Internal Medicine | Admitting: Internal Medicine

## 2023-11-18 DIAGNOSIS — Z1231 Encounter for screening mammogram for malignant neoplasm of breast: Secondary | ICD-10-CM | POA: Diagnosis not present

## 2023-11-20 DIAGNOSIS — I129 Hypertensive chronic kidney disease with stage 1 through stage 4 chronic kidney disease, or unspecified chronic kidney disease: Secondary | ICD-10-CM | POA: Diagnosis not present

## 2023-11-20 DIAGNOSIS — G720 Drug-induced myopathy: Secondary | ICD-10-CM | POA: Diagnosis not present

## 2023-11-20 DIAGNOSIS — N1832 Chronic kidney disease, stage 3b: Secondary | ICD-10-CM | POA: Diagnosis not present

## 2023-11-20 DIAGNOSIS — Z794 Long term (current) use of insulin: Secondary | ICD-10-CM | POA: Diagnosis not present

## 2023-11-20 DIAGNOSIS — E1129 Type 2 diabetes mellitus with other diabetic kidney complication: Secondary | ICD-10-CM | POA: Diagnosis not present

## 2023-11-20 DIAGNOSIS — E785 Hyperlipidemia, unspecified: Secondary | ICD-10-CM | POA: Diagnosis not present

## 2023-12-10 ENCOUNTER — Ambulatory Visit: Payer: Medicare Other

## 2024-01-01 ENCOUNTER — Ambulatory Visit: Payer: Medicare Other | Admitting: Internal Medicine

## 2024-01-01 ENCOUNTER — Encounter: Payer: Self-pay | Admitting: Internal Medicine

## 2024-01-01 VITALS — BP 140/78 | HR 74 | Ht 64.0 in | Wt 165.5 lb

## 2024-01-01 DIAGNOSIS — K219 Gastro-esophageal reflux disease without esophagitis: Secondary | ICD-10-CM

## 2024-01-01 DIAGNOSIS — N189 Chronic kidney disease, unspecified: Secondary | ICD-10-CM

## 2024-01-01 DIAGNOSIS — R1319 Other dysphagia: Secondary | ICD-10-CM

## 2024-01-01 NOTE — Progress Notes (Signed)
Frances Brown 78 y.o. 1946-11-01 [id number]  Assessment & Plan:   Encounter Diagnoses  Name Primary?   Esophageal dysphagia Yes   Gastroesophageal reflux disease, unspecified whether esophagitis present    Chronic kidney disease, unspecified CKD stage    Resume omeprazole - weak association on retrospective study does not prove causality for kidney failure or other diseases- explained to patient.  Time course of signs and symptoms onset relates to stopping omeprazole over fears of exacerbating chronic kidney disease which is more likely related to diabetes.  Will schedule EGD w/ likely esophageal dilation - The risks and benefits as well as alternatives of endoscopic procedure(s) have been discussed and reviewed. All questions answered. The patient agrees to proceed.   Reduce starchy carbs if possible  CC: Cleatis Polka., MD   Subjective:  Gastroenterology summary:  Initially seen by Dr. Leone Payor 2011, prior history with Dr. Corinda Gubler  IBS-D  and Diverticulitis-recurrent (question actually symptomatic diverticulosis plus or minus IBS) chronic recurrent left lower quadrant pain-imaging has often not proven diverticulitis though a scan in January 2023 (urology) demonstrated sigmoid diverticulitis.  This persisted along with symptoms on a scan in March 2023.  Last CT scan August 2023 with diverticulosis and no diverticulitis.  She does improve with antibiotics (typically Augmentin).  Dicyclomine can be helpful.  GERD EGD 2016 with a 3 cm hiatal hernia and benign fundic gland polyps Has been maintained on PPI over the years, at times has had some LPR type symptoms.  History of colon polyps 06/02/20 - Four diminutive polyps in the sigmoid colon, in the transverse colon and in the ascending colon, removed with a cold snare. Resected and retrieved.  Diminutive SSP- Diverticulosis in the sigmoid colon and in the descending colon. - The examination was otherwise normal on direct  and retroflexion views. - Personal history of colonic polyps 5 and 10 mm ssp/a's 2018.  No further routine due to age and overall findings  ------------------------------------------------------------------------------------------------  Chief Complaint: Indigestion and abdominal pain and dysphagia  HPI 78 year old white woman followed in this clinic with suspected IBS-D and issues with diverticulitis, last seen in March 2024.  Today she presents with a 2 to 89-month history of intermittent solid food dysphagia described as a suprasternal sticking point, food will go up or down and she drinks a warm liquid and eventually passes.  This happens a few times a month it sounds like.  The first time it happened was around Thanksgiving, and that was with him and she has had it with bread and other meats.  In the fall she saw primary care and it was mentioned that she had chronic kidney disease and that her "PPI is not good for that" according to the patient.  She stopped taking her 20 mg omeprazole.  She has used it rarely since then if she has had some heartburn which is not frequent.  She has tried to use Tums as well occasionally.  She stopped caffeine to see if that would relieve some of her symptoms, she has been going to an herbal tea.   IBS and diverticulitis under control, no active problems.  She reports Ozempic was tried for her diabetes last year but "I felt like I was leaving this world" so it was stopped and she is now on a newer insulin.  She does admit to ingestion of starchy carbohydrates, she would like to lose weight but it has been difficult.  She does not drink sugary sweetened beverages.  Wt  Readings from Last 3 Encounters:  01/01/24 165 lb 8 oz (75.1 kg)  08/14/23 162 lb 6.4 oz (73.7 kg)  02/08/23 163 lb 3.2 oz (74 kg)    Allergies  Allergen Reactions   Janumet Xr [Sitagliptin-Metformin Hcl Er] Swelling   Tricor [Fenofibrate] Swelling   Zocor [Simvastatin] Swelling   Lovaza  [Omega-3-Acid Ethyl Esters (Fish)] Other (See Comments)    GI intolerance   Sulfa Antibiotics    Trimethoprim     Rash / hives that are painful   Elemental Sulfur Rash   Current Meds  Medication Sig   bisoprolol (ZEBETA) 10 MG tablet Take 10 mg by mouth daily.   cholecalciferol (VITAMIN D) 1000 units tablet Take 1,000 Units by mouth daily.   Coenzyme Q10 (COQ10) 100 MG CAPS Take 1 capsule by mouth daily.   fenofibrate 54 MG tablet Take 54 mg by mouth daily.   metFORMIN (GLUCOPHAGE-XR) 500 MG 24 hr tablet Take 500 mg by mouth daily with breakfast.   Multiple Vitamins-Minerals (PRESERVISION AREDS 2 PO) Take 1 tablet by mouth daily.   olmesartan (BENICAR) 40 MG tablet Take 1 tablet by mouth daily.   omeprazole (PRILOSEC) 20 MG capsule Take 20 mg by mouth as needed.   rosuvastatin (CRESTOR) 10 MG tablet Patient takes 3 times a week.   TOUJEO SOLOSTAR 300 UNIT/ML Solostar Pen Inject 18 Units into the skin daily.   vitamin B-12 (CYANOCOBALAMIN) 1000 MCG tablet Take 1,000 mcg by mouth daily.   Past Medical History:  Diagnosis Date   Allergy    Anxiety    no meds    Cancer (HCC)    mole    Diabetes mellitus without complication (HCC)    Diverticulosis    Fibrocystic breast disease    Fundic gland polyps of stomach, benign 03/24/2015   GERD (gastroesophageal reflux disease)    Hx of colonic polyps 04/24/2017   Hyperlipidemia    Hypertension    IBS (irritable bowel syndrome)    Macular degeneration    vitreous hemorrhage, does injections in rt every 5 weeks   Obesity    Papilledema, both eyes 07/28/2018   Dr Octavia Heir   Tachycardia, unspecified 04/08/2017   pt. states she saw Dr. Alford Highland for this no meds no procedures, wore monitor   Urge incontinence    Past Surgical History:  Procedure Laterality Date   BREAST EXCISIONAL BIOPSY Left    over 15 years ( she thinks it may have been a cysts removal but there is a scar   breast lymph node removed     right    CHOLECYSTECTOMY     COLONOSCOPY  06/2002, 04/10/2007   diverticulosis, colon polyps, external hemorrhoids   KNEE ARTHROSCOPY     left   PARTIAL HYSTERECTOMY     POLYPECTOMY     SHOULDER SURGERY     right   UPPER GASTROINTESTINAL ENDOSCOPY  09/27/11   VIDEO BRONCHOSCOPY Bilateral 02/14/2015   Procedure: VIDEO BRONCHOSCOPY WITHOUT FLUORO;  Surgeon: Leslye Peer, MD;  Location: [facility];  Service: Cardiopulmonary;  Laterality: Bilateral;   WISDOM TOOTH EXTRACTION     Social History   Social History Narrative   Has one great Teacher, early years/pre, Lawyer.  Lives home with husband.  Has 2 children.  Caffeine rare.     Enjoys gardening   family history includes Breast cancer in her maternal aunt; Cirrhosis in an other family member; Colon cancer (age of onset: 38) in her maternal uncle; Diabetes  in her father and mother; Heart failure in her father and mother; Hypertension in her mother and sister; Leukemia in an other family member; Lung cancer in her mother; Stroke in her mother.   Review of Systems As per HPI  Objective:   Physical Exam BP (!) 140/78   Pulse 74   Ht 5\' 4"  (1.626 m)   Wt 165 lb 8 oz (75.1 kg)   BMI 28.41 kg/m  NAD Anicteric Neck supple no TM/mass Lungs cta Cor NL S1S2 no rmg Abd soft benign (minimally diffusely tender but this is chronic) BS+

## 2024-01-01 NOTE — Patient Instructions (Addendum)
Please resume your omeprazole. Take it at least 30 minutes prior to breakfast daily.  There is much written and talked about possible health problems with the use of PPI medications (omeprazole (Prilosec), esomeprazole (Nexium), rabeprazole (Aciphex), lansoprazole (Prevacid), Dexlansoprazole (Dexilant) and pantoprazole ( Protonix).  The truth is the studies that suggest health problems with the use of PPI medications only find a weak association at best with the possible diseases reported to occur like kidney failure. Many sicker patients with multiple health problems end up on these medications for various reasons. Establishing cause and effect in medical research is extremely difficult and though I believe there are very rare occurrences in which PPI's do cause harm in these ways, the benefits of you taking your PPI outweigh these largely theoretical and sometimes directly disproven risks.  As always, I/we will strive to have you on the lowest effective dose and frequency of a medication to keep you well and with a good quality of life.   You have been scheduled for an endoscopy. Please follow written instructions given to you at your visit today.  If you use inhalers (even only as needed), please bring them with you on the day of your procedure.  If you take any of the following medications, they will need to be adjusted prior to your procedure:   DO NOT TAKE 7 DAYS PRIOR TO TEST- Trulicity (dulaglutide) Ozempic, Wegovy (semaglutide) Mounjaro (tirzepatide) Bydureon Bcise (exanatide extended release)  DO NOT TAKE 1 DAY PRIOR TO YOUR TEST Rybelsus (semaglutide) Adlyxin (lixisenatide) Victoza (liraglutide) Byetta (exanatide) ___________________________________________________________________________   _______________________________________________________  If your blood pressure at your visit was 140/90 or greater, please contact your primary care physician to follow up on  this.  _______________________________________________________  If you are age 80 or older, your body mass index should be between 23-30. Your Body mass index is 28.41 kg/m. If this is out of the aforementioned range listed, please consider follow up with your Primary Care Provider.  If you are age 3 or younger, your body mass index should be between 19-25. Your Body mass index is 28.41 kg/m. If this is out of the aformentioned range listed, please consider follow up with your Primary Care Provider.   ________________________________________________________  The [facility] providers would like to encourage you to use [facility] to communicate with providers for non-urgent requests or questions.  Due to long hold times on the telephone, sending your provider a message by [facility] may be a faster and more efficient way to get a response.  Please allow 48 business hours for a response.  Please remember that this is for non-urgent requests.  _______________________________________________________  I appreciate the opportunity to care for you. Stan Head, MD, [facility]

## 2024-01-03 ENCOUNTER — Telehealth: Payer: Self-pay | Admitting: Internal Medicine

## 2024-01-03 NOTE — Telephone Encounter (Signed)
Patient called and stated that she is having a itchy scratchy throat and believes she might be getting a sore throat. Patient is wanting some advise on what she should do. Patient also stated that she is not wanting to reschedule and is wanting to get her procedure done with. Please advise.

## 2024-01-03 NOTE — Telephone Encounter (Signed)
Pt stated that she felt her throat was a little scratch and sore this AM . Pt stated that she  is feeling better since this AM after gargling salt water. Pt was recommended to try some chloraseptic spray or lozenges and continue gargling salt water. May use tylenol. Pt stated that she had no fever nor other symptoms. Pt was notified to call our office back prior to procedure if her symptoms worsen. Pt verbalized understanding with all questions answered.

## 2024-01-07 ENCOUNTER — Encounter: Payer: Medicare Other | Admitting: Internal Medicine

## 2024-01-08 DIAGNOSIS — Z794 Long term (current) use of insulin: Secondary | ICD-10-CM | POA: Diagnosis not present

## 2024-01-08 DIAGNOSIS — N1832 Chronic kidney disease, stage 3b: Secondary | ICD-10-CM | POA: Diagnosis not present

## 2024-01-08 DIAGNOSIS — E1129 Type 2 diabetes mellitus with other diabetic kidney complication: Secondary | ICD-10-CM | POA: Diagnosis not present

## 2024-01-08 DIAGNOSIS — R051 Acute cough: Secondary | ICD-10-CM | POA: Diagnosis not present

## 2024-01-08 DIAGNOSIS — J069 Acute upper respiratory infection, unspecified: Secondary | ICD-10-CM | POA: Diagnosis not present

## 2024-01-08 DIAGNOSIS — I129 Hypertensive chronic kidney disease with stage 1 through stage 4 chronic kidney disease, or unspecified chronic kidney disease: Secondary | ICD-10-CM | POA: Diagnosis not present

## 2024-01-08 DIAGNOSIS — J40 Bronchitis, not specified as acute or chronic: Secondary | ICD-10-CM | POA: Diagnosis not present

## 2024-01-14 ENCOUNTER — Encounter: Payer: Self-pay | Admitting: Internal Medicine

## 2024-01-14 ENCOUNTER — Ambulatory Visit (AMBULATORY_SURGERY_CENTER): Payer: Medicare Other | Admitting: Internal Medicine

## 2024-01-14 VITALS — BP 152/80 | HR 75 | Temp 97.0°F | Resp 28 | Ht 64.0 in | Wt 165.0 lb

## 2024-01-14 DIAGNOSIS — K222 Esophageal obstruction: Secondary | ICD-10-CM | POA: Diagnosis not present

## 2024-01-14 DIAGNOSIS — R1319 Other dysphagia: Secondary | ICD-10-CM

## 2024-01-14 DIAGNOSIS — R1310 Dysphagia, unspecified: Secondary | ICD-10-CM | POA: Diagnosis not present

## 2024-01-14 DIAGNOSIS — K317 Polyp of stomach and duodenum: Secondary | ICD-10-CM | POA: Diagnosis not present

## 2024-01-14 DIAGNOSIS — K449 Diaphragmatic hernia without obstruction or gangrene: Secondary | ICD-10-CM | POA: Diagnosis not present

## 2024-01-14 DIAGNOSIS — Q399 Congenital malformation of esophagus, unspecified: Secondary | ICD-10-CM | POA: Diagnosis not present

## 2024-01-14 MED ORDER — SODIUM CHLORIDE 0.9 % IV SOLN: 500.0000 mL | Freq: Once | INTRAVENOUS | Status: DC

## 2024-01-14 NOTE — Op Note (Signed)
 [facility] Patient Name: Frances Brown Procedure Date: 01/14/2024 10:18 AM MRN: [id number] Endoscopist: Lupita FORBES Commander , MD, [id number] Age: 78 Referring MD:  Date of Birth: [birth year] Gender: Female Account #: [id number] Procedure:                Upper GI endoscopy Indications:              Dysphagia - returned after stopping omeprazole  -                            omeprazole  restarted about 1 month ago. Medicines:                Monitored Anesthesia Care Procedure:                Pre-Anesthesia Assessment:                           - Prior to the procedure, a History and Physical                            was performed, and patient medications and                            allergies were reviewed. The patient's tolerance of                            previous anesthesia was also reviewed. The risks                            and benefits of the procedure and the sedation                            options and risks were discussed with the patient.                            All questions were answered, and informed consent                            was obtained. Prior Anticoagulants: The patient has                            taken no anticoagulant or antiplatelet agents. ASA                            Grade Assessment: II - A patient with mild systemic                            disease. After reviewing the risks and benefits,                            the patient was deemed in satisfactory condition to                            undergo the procedure.  After obtaining informed consent, the endoscope was                            passed under direct vision. Throughout the                            procedure, the patient's blood pressure, pulse, and                            oxygen saturations were monitored continuously. The                            GIF [id number] #[id number] was introduced through the                            mouth, and  advanced to the second part of duodenum.                            The upper GI endoscopy was accomplished without                            difficulty. The patient tolerated the procedure                            well. Scope In: Scope Out: Findings:                 The examined esophagus was mildly tortuous.                           One benign-appearing, intrinsic moderate                            (circumferential scarring or stenosis; an endoscope                            may pass) stenosis was found at the                            gastroesophageal junction. The stenosis was                            traversed. A TTS dilator was passed through the                            scope. Dilation with an 18-19-20 mm balloon dilator                            was performed to 20 mm. The dilation site was                            examined and showed no change. Estimated blood                            loss: none.  A 5 cm hiatal hernia was present.                           The gastroesophageal flap valve was visualized                            endoscopically and classified as Hill Grade IV (no                            fold, wide open lumen, hiatal hernia present).                           Multiple diminutive sessile polyps were found in                            the gastric fundus and in the gastric body.                           The exam was otherwise without abnormality.                           The cardia and gastric fundus were normal on                            retroflexion. Complications:            No immediate complications. Estimated Blood Loss:     Estimated blood loss: none. Impression:               - Tortuous esophagus.                           - Benign-appearing esophageal stenosis. Dilated.20                            mm. No mucosal disruption.                           - 5 cm hiatal hernia. 33-38 cm                            - Gastroesophageal flap valve classified as Hill                            Grade IV (no fold, wide open lumen, hiatal hernia                            present).                           - Multiple gastric polyps. Diminutive and classic                            appearance for benign fundic gland polyps                           -  The examination was otherwise normal.                           - No specimens collected. Recommendation:           - Patient has a contact number available for                            emergencies. The signs and symptoms of potential                            delayed complications were discussed with the                            patient. Return to normal activities tomorrow.                            Written discharge instructions were provided to the                            patient.                           - Clear liquids x 1 hour then soft foods rest of                            day. Start prior diet tomorrow.                           - Continue present medications.                           - Stay on omeprazole  - if still w/ dysphagia in 2                            months or so she should contact me to be reassessed. Lupita FORBES Commander, MD 01/14/2024 10:38:25 AM This report has been signed electronically.

## 2024-01-14 NOTE — Progress Notes (Signed)
 History and Physical Interval Note:  01/14/2024 10:12 AM  [name]  has presented today for endoscopic procedure(s), with the diagnosis of  Encounter Diagnosis  Name Primary?   Esophageal dysphagia Yes  .  The various methods of evaluation and treatment have been discussed with the patient and/or family. After consideration of risks, benefits and other options for treatment, the patient has consented to  the endoscopic procedure(s).   The patient's history has been reviewed, patient examined, no change in status, stable for endoscopic procedure(s).  I have reviewed the patient's chart and labs.  Questions were answered to the patient's satisfaction.     [name], MD, NOLIA

## 2024-01-14 NOTE — Patient Instructions (Addendum)
 I saw your hiatal hernia and dilated the lower esophagus.  There are some tiny innocent stomach polyps not involved in your symptoms and they do not lead to problems.  Please stay on the omeprazole  and if after a couple of months (March or April) you still have swallowing problems, let me know.  I appreciate the opportunity to care for you. Frances CHARLENA Commander, MD, FACG  YOU HAD AN ENDOSCOPIC PROCEDURE TODAY AT THE [facility]:   Refer to the procedure report that was given to you for any specific questions about what was found during the examination.  If the procedure report does not answer your questions, please call your gastroenterologist to clarify.  If you requested that your care partner not be given the details of your procedure findings, then the procedure report has been included in a sealed envelope for you to review at your convenience later.  YOU SHOULD EXPECT: Some feelings of bloating in the abdomen. Passage of more gas than usual.  Walking can help get rid of the air that was put into your GI tract during the procedure and reduce the bloating. If you had a lower endoscopy (such as a colonoscopy or flexible sigmoidoscopy) you may notice spotting of blood in your stool or on the toilet paper. If you underwent a bowel prep for your procedure, you may not have a normal bowel movement for a few days.  Please Note:  You might notice some irritation and congestion in your nose or some drainage.  This is from the oxygen used during your procedure.  There is no need for concern and it should clear up in a day or so.  SYMPTOMS TO REPORT IMMEDIATELY:   Following upper endoscopy (EGD)  Vomiting of blood or coffee ground material  New chest pain or pain under the shoulder blades  Painful or persistently difficult swallowing  New shortness of breath  Fever of 100F or higher  Black, tarry-looking stools  For urgent or emergent issues, a gastroenterologist can be reached at any  hour by calling [phone]. Do not use MyChart messaging for urgent concerns.    DIET:  Please follow a Post Dilation Diet (see handout): Nothing to eat or drink until 12:00 pm, then CLEAR LIQUIDS ONLY from  12:00 pm to 1:00 pm, you may advance to a SOFT DIET starting at 1:00pm for the rest of the day today. You may proceed to a regular diet tomorrow morning as tolerated.  Drink plenty of fluids but you should avoid alcoholic beverages for 24 hours.  MEDICATIONS: Stay on Omeprazole . .  Please see handouts given to you by your recovery nurse: Post Dilation Diet, Hiatal Hernia, Esophageal stricture.  FOLLOW UP: If you are still having difficulty swallowing in 2 months or so, please contact Dr. Commander to be reassessed.   Thank you for allowing us  to provide for your healthcare needs today.  ACTIVITY:  You should plan to take it easy for the rest of today and you should NOT DRIVE or use heavy machinery until tomorrow (because of the sedation medicines used during the test).    FOLLOW UP: Our staff will call the number listed on your records the next business day following your procedure.  We will call around 7:15- 8:00 am to check on you and address any questions or concerns that you may have regarding the information given to you following your procedure. If we do not reach you, we will leave a message.  If any biopsies were taken you will be contacted by phone or by letter within the next 1-3 weeks.  Please call us  at [phone] if you have not heard about the biopsies in 3 weeks.    SIGNATURES/CONFIDENTIALITY: You and/or your care partner have signed paperwork which will be entered into your electronic medical record.  These signatures attest to the fact that that the information above on your After Visit Summary has been reviewed and is understood.  Full responsibility of the confidentiality of this discharge information lies with you and/or your care-partner. gastroesophageal  reflux diseasegastroesophageal reflux disease      Hiatal Hernia  A hiatal hernia occurs when part of the stomach slides above the muscle that separates the abdomen from the chest (diaphragm). A person can be born with a hiatal hernia (congenital), or it may develop over time. In almost all cases of hiatal hernia, only the top part of the stomach pushes through the diaphragm. Many people have a hiatal hernia with no symptoms. The larger the hernia, the more likely it is that you will have symptoms. In some cases, a hiatal hernia allows stomach acid to flow back into the tube that carries food from your mouth to your stomach (esophagus). This may cause heartburn symptoms. The development of heartburn symptoms may mean that you have a condition called gastroesophageal reflux disease (GERD). What are the causes? This condition is caused by a weakness in the opening (hiatus) where the esophagus passes through the diaphragm to attach to the upper part of the stomach. A person may be born with a weakness in the hiatus, or a weakness can develop over time. What increases the risk? This condition is more likely to develop in: Older people. Age is a major risk factor for a hiatal hernia, especially if you are over the age of 47. Pregnant women. People who are overweight. People who have frequent constipation. What are the signs or symptoms? Symptoms of this condition usually develop in the form of GERD symptoms. Symptoms include: Heartburn. Upset stomach (indigestion). Trouble swallowing. Coughing or wheezing. Wheezing is making high-pitched whistling sounds when you breathe. Sore throat. Chest pain. Nausea and vomiting. How is this diagnosed? This condition may be diagnosed during testing for GERD. Tests that may be done include: X-rays of your stomach or chest. An upper gastrointestinal (GI) series. This is an X-ray exam of your GI tract that is taken after you swallow a chalky liquid that  shows up clearly on the X-ray. Endoscopy. This is a procedure to look into your stomach using a thin, flexible tube that has a tiny camera and light on the end of it. How is this treated? This condition may be treated by: Dietary and lifestyle changes to help reduce GERD symptoms. Medicines. These may include: Over-the-counter antacids. Medicines that make your stomach empty more quickly. Medicines that block the production of stomach acid (H2 blockers). Stronger medicines to reduce stomach acid (proton pump inhibitors). Surgery to repair the hernia, if other treatments are not helping. If you have no symptoms, you may not need treatment. Follow these instructions at home: Lifestyle and activity Do not use any products that contain nicotine or tobacco. These products include cigarettes, chewing tobacco, and vaping devices, such as e-cigarettes. If you need help quitting, ask your health care provider. Try to achieve and maintain a healthy body weight. Avoid putting pressure on your abdomen. Anything that puts pressure on your abdomen increases the amount of acid that may be  pushed up into your esophagus. Avoid bending over, especially after eating. Raise the head of your bed by putting blocks under the legs. This keeps your head and esophagus higher than your stomach. Do not wear tight clothing around your chest or stomach. Try not to strain when having a bowel movement, when urinating, or when lifting heavy objects. Eating and drinking Avoid foods that can worsen GERD symptoms. These may include: Fatty foods, like fried foods. Citrus fruits, like oranges or lemon. Other foods and drinks that contain acid, like orange juice or tomatoes. Spicy food. Chocolate. Eat frequent small meals instead of three large meals a day. This helps prevent your stomach from getting too full. Eat slowly. Do not lie down right after eating. Do not eat 1-2 hours before bed. Do not drink beverages with  caffeine. These include cola, coffee, cocoa, and tea. Do not drink alcohol . General instructions Take over-the-counter and prescription medicines only as told by your health care provider. Keep all follow-up visits. Your health care provider will want to check that any new prescribed medicines are helping your symptoms. Contact a health care provider if: Your symptoms are not controlled with medicines or lifestyle changes. You are having trouble swallowing. You have coughing or wheezing that will not go away. Your pain is getting worse. Your pain spreads to your arms, neck, jaw, teeth, or back. You feel nauseous or you vomit. Get help right away if: You have shortness of breath. You vomit blood. You have bright red blood in your stools. You have black, tarry stools. These symptoms may be an emergency. Get help right away. Call 911. Do not wait to see if the symptoms will go away. Do not drive yourself to the hospital. Summary A hiatal hernia occurs when part of the stomach slides above the muscle that separates the abdomen from the chest. A person may be born with a weakness in the hiatus, or a weakness can develop over time. Symptoms of a hiatal hernia may include heartburn, trouble swallowing, or sore throat. Management of a hiatal hernia includes eating frequent small meals instead of three large meals a day. Get help right away if you vomit blood, have bright red blood in your stools, or have black, tarry stools. This information is not intended to replace advice given to you by your health care provider. Make sure you discuss any questions you have with your health care provider. Document Revised: 01/23/2022 Document Reviewed: 01/23/2022 Elsevier Patient Education  2024 [employer].

## 2024-01-14 NOTE — Progress Notes (Signed)
 Sedate, gd SR, tolerated procedure well, VSS, report to RN

## 2024-01-14 NOTE — Progress Notes (Signed)
 Called to procedure room to assist with endoscopy.

## 2024-01-15 ENCOUNTER — Telehealth: Payer: Self-pay | Admitting: *Deleted

## 2024-01-15 NOTE — Telephone Encounter (Signed)
  Follow up Call-     01/14/2024    9:16 AM  Call back number  Post procedure Call Back phone  # [phone]  Permission to leave phone message Yes     Patient questions:  Do you have a fever, pain , or abdominal swelling? No. Pain Score  0 *  Have you tolerated food without any problems? Yes.    Have you been able to return to your normal activities? Yes.    Do you have any questions about your discharge instructions: Diet   No. Medications  No. Follow up visit  No.  Do you have questions or concerns about your Care? No.  Actions: * If pain score is 4 or above: No action needed, pain <4.

## 2024-01-24 DIAGNOSIS — E113593 Type 2 diabetes mellitus with proliferative diabetic retinopathy without macular edema, bilateral: Secondary | ICD-10-CM | POA: Diagnosis not present

## 2024-01-24 DIAGNOSIS — H353123 Nonexudative age-related macular degeneration, left eye, advanced atrophic without subfoveal involvement: Secondary | ICD-10-CM | POA: Diagnosis not present

## 2024-01-24 DIAGNOSIS — H353212 Exudative age-related macular degeneration, right eye, with inactive choroidal neovascularization: Secondary | ICD-10-CM | POA: Diagnosis not present

## 2024-01-24 DIAGNOSIS — H31013 Macula scars of posterior pole (postinflammatory) (post-traumatic), bilateral: Secondary | ICD-10-CM | POA: Diagnosis not present

## 2024-02-11 DIAGNOSIS — H353212 Exudative age-related macular degeneration, right eye, with inactive choroidal neovascularization: Secondary | ICD-10-CM | POA: Diagnosis not present

## 2024-02-20 ENCOUNTER — Encounter: Payer: Self-pay | Admitting: Internal Medicine

## 2024-02-20 ENCOUNTER — Telehealth: Payer: Self-pay | Admitting: Internal Medicine

## 2024-02-20 DIAGNOSIS — N184 Chronic kidney disease, stage 4 (severe): Secondary | ICD-10-CM

## 2024-02-20 HISTORY — DX: Chronic kidney disease, stage 4 (severe): N18.4

## 2024-02-20 MED ORDER — AMOXICILLIN-POT CLAVULANATE 500-125 MG PO TABS: 1.0000 | ORAL_TABLET | Freq: Two times a day (BID) | ORAL | 0 refills | Status: AC

## 2024-02-20 MED ORDER — AMOXICILLIN-POT CLAVULANATE 875-125 MG PO TABS: 1.0000 | ORAL_TABLET | Freq: Two times a day (BID) | ORAL | 0 refills | Status: DC

## 2024-02-20 NOTE — Telephone Encounter (Signed)
 [facility] called and stated that this patient is in stage 4 kidney disease and the dosage provided is not recommenced for that patient. Pharmacy is not requesting that we return there call regarding the dosage. [facility] rep stated patient is at the pharmacy waiting. Please advise.

## 2024-02-20 NOTE — Telephone Encounter (Signed)
 Dr Leone Payor see the note from the pharmacy. Please advise

## 2024-02-20 NOTE — Telephone Encounter (Signed)
 This encounter was created in error - please disregard.

## 2024-02-20 NOTE — Telephone Encounter (Signed)
 Patient called stated she thinks she has a Diverticulitis flare up seeking medication to help her.

## 2024-02-20 NOTE — Telephone Encounter (Signed)
 The patient has been notified of this information and all questions answered.

## 2024-02-20 NOTE — Telephone Encounter (Signed)
 The pt states that about a week ago she developed soreness on the left side of the abd that feels like it did when she last had diverticulitis flare.  She has had more urgency and diarrhea episodes. Afebrile with no other symptoms.  Please advise

## 2024-02-20 NOTE — Telephone Encounter (Signed)
 New Rx sent dose adjusted for CKD stage 4  Please let pharmacy know.

## 2024-02-20 NOTE — Telephone Encounter (Signed)
 I sent in Augmentin Rx to [facility]  If she does not resolve she should let us know

## 2024-02-20 NOTE — Addendum Note (Signed)
 Addended by: Iva Boop on: 02/20/2024 02:55 PM   Modules accepted: Orders

## 2024-02-20 NOTE — Telephone Encounter (Signed)
 Pharmacy aware

## 2024-02-27 DIAGNOSIS — Z794 Long term (current) use of insulin: Secondary | ICD-10-CM | POA: Diagnosis not present

## 2024-02-27 DIAGNOSIS — G720 Drug-induced myopathy: Secondary | ICD-10-CM | POA: Diagnosis not present

## 2024-02-27 DIAGNOSIS — I129 Hypertensive chronic kidney disease with stage 1 through stage 4 chronic kidney disease, or unspecified chronic kidney disease: Secondary | ICD-10-CM | POA: Diagnosis not present

## 2024-02-27 DIAGNOSIS — E1129 Type 2 diabetes mellitus with other diabetic kidney complication: Secondary | ICD-10-CM | POA: Diagnosis not present

## 2024-02-27 DIAGNOSIS — E785 Hyperlipidemia, unspecified: Secondary | ICD-10-CM | POA: Diagnosis not present

## 2024-02-27 DIAGNOSIS — N1832 Chronic kidney disease, stage 3b: Secondary | ICD-10-CM | POA: Diagnosis not present

## 2024-02-28 ENCOUNTER — Telehealth: Payer: Self-pay | Admitting: Internal Medicine

## 2024-02-28 NOTE — Telephone Encounter (Signed)
 Patient called and  is requesting a call back regarding her medication Soduim Cholride infusion. Patient stated that she had a couple of question that she wanted answer by the nurse. Please advise.

## 2024-02-28 NOTE — Telephone Encounter (Signed)
 Spoke with the pt and discussed her recent antibiotic prescription. She wanted to ensure that our office is aware that the does sent was reduced to accommodate her kidney function.  I made her aware that Dr Leone Payor reviewed and sent the medication himself. She thanked me for calling and letting her know

## 2024-03-17 DIAGNOSIS — M8580 Other specified disorders of bone density and structure, unspecified site: Secondary | ICD-10-CM | POA: Diagnosis not present

## 2024-03-17 DIAGNOSIS — E1139 Type 2 diabetes mellitus with other diabetic ophthalmic complication: Secondary | ICD-10-CM | POA: Diagnosis not present

## 2024-03-17 DIAGNOSIS — Z780 Asymptomatic menopausal state: Secondary | ICD-10-CM | POA: Diagnosis not present

## 2024-03-17 DIAGNOSIS — M859 Disorder of bone density and structure, unspecified: Secondary | ICD-10-CM | POA: Diagnosis not present

## 2024-03-17 DIAGNOSIS — I10 Essential (primary) hypertension: Secondary | ICD-10-CM | POA: Diagnosis not present

## 2024-03-17 DIAGNOSIS — H353210 Exudative age-related macular degeneration, right eye, stage unspecified: Secondary | ICD-10-CM | POA: Diagnosis not present

## 2024-03-17 DIAGNOSIS — E785 Hyperlipidemia, unspecified: Secondary | ICD-10-CM | POA: Diagnosis not present

## 2024-03-17 DIAGNOSIS — I700 Atherosclerosis of aorta: Secondary | ICD-10-CM | POA: Diagnosis not present

## 2024-03-17 DIAGNOSIS — N1832 Chronic kidney disease, stage 3b: Secondary | ICD-10-CM | POA: Diagnosis not present

## 2024-03-17 DIAGNOSIS — G932 Benign intracranial hypertension: Secondary | ICD-10-CM | POA: Diagnosis not present

## 2024-03-17 DIAGNOSIS — E1129 Type 2 diabetes mellitus with other diabetic kidney complication: Secondary | ICD-10-CM | POA: Diagnosis not present

## 2024-03-17 DIAGNOSIS — I129 Hypertensive chronic kidney disease with stage 1 through stage 4 chronic kidney disease, or unspecified chronic kidney disease: Secondary | ICD-10-CM | POA: Diagnosis not present

## 2024-03-17 DIAGNOSIS — E781 Pure hyperglyceridemia: Secondary | ICD-10-CM | POA: Diagnosis not present

## 2024-03-17 DIAGNOSIS — G629 Polyneuropathy, unspecified: Secondary | ICD-10-CM | POA: Diagnosis not present

## 2024-03-17 DIAGNOSIS — K529 Noninfective gastroenteritis and colitis, unspecified: Secondary | ICD-10-CM | POA: Diagnosis not present

## 2024-04-04 LAB — AMB RESULTS CONSOLE CBG: Glucose: 184

## 2024-05-13 DIAGNOSIS — H31013 Macula scars of posterior pole (postinflammatory) (post-traumatic), bilateral: Secondary | ICD-10-CM | POA: Diagnosis not present

## 2024-05-13 DIAGNOSIS — H353123 Nonexudative age-related macular degeneration, left eye, advanced atrophic without subfoveal involvement: Secondary | ICD-10-CM | POA: Diagnosis not present

## 2024-05-13 DIAGNOSIS — H353212 Exudative age-related macular degeneration, right eye, with inactive choroidal neovascularization: Secondary | ICD-10-CM | POA: Diagnosis not present

## 2024-05-13 DIAGNOSIS — E113593 Type 2 diabetes mellitus with proliferative diabetic retinopathy without macular edema, bilateral: Secondary | ICD-10-CM | POA: Diagnosis not present

## 2024-05-14 DIAGNOSIS — E1139 Type 2 diabetes mellitus with other diabetic ophthalmic complication: Secondary | ICD-10-CM | POA: Diagnosis not present

## 2024-05-14 DIAGNOSIS — R300 Dysuria: Secondary | ICD-10-CM | POA: Diagnosis not present

## 2024-05-14 DIAGNOSIS — N1832 Chronic kidney disease, stage 3b: Secondary | ICD-10-CM | POA: Diagnosis not present

## 2024-05-14 DIAGNOSIS — M545 Low back pain, unspecified: Secondary | ICD-10-CM | POA: Diagnosis not present

## 2024-05-14 DIAGNOSIS — R3915 Urgency of urination: Secondary | ICD-10-CM | POA: Diagnosis not present

## 2024-05-14 DIAGNOSIS — I129 Hypertensive chronic kidney disease with stage 1 through stage 4 chronic kidney disease, or unspecified chronic kidney disease: Secondary | ICD-10-CM | POA: Diagnosis not present

## 2024-05-14 DIAGNOSIS — E1122 Type 2 diabetes mellitus with diabetic chronic kidney disease: Secondary | ICD-10-CM | POA: Diagnosis not present

## 2024-05-14 DIAGNOSIS — E785 Hyperlipidemia, unspecified: Secondary | ICD-10-CM | POA: Diagnosis not present

## 2024-05-14 DIAGNOSIS — R102 Pelvic and perineal pain: Secondary | ICD-10-CM | POA: Diagnosis not present

## 2024-05-14 DIAGNOSIS — E1129 Type 2 diabetes mellitus with other diabetic kidney complication: Secondary | ICD-10-CM | POA: Diagnosis not present

## 2024-05-14 DIAGNOSIS — G720 Drug-induced myopathy: Secondary | ICD-10-CM | POA: Diagnosis not present

## 2024-05-14 DIAGNOSIS — Z794 Long term (current) use of insulin: Secondary | ICD-10-CM | POA: Diagnosis not present

## 2024-05-14 DIAGNOSIS — N3020 Other chronic cystitis without hematuria: Secondary | ICD-10-CM | POA: Diagnosis not present

## 2024-05-14 DIAGNOSIS — R350 Frequency of micturition: Secondary | ICD-10-CM | POA: Diagnosis not present

## 2024-05-14 DIAGNOSIS — N390 Urinary tract infection, site not specified: Secondary | ICD-10-CM | POA: Diagnosis not present

## 2024-05-15 DIAGNOSIS — N3000 Acute cystitis without hematuria: Secondary | ICD-10-CM | POA: Diagnosis not present

## 2024-05-19 DIAGNOSIS — K449 Diaphragmatic hernia without obstruction or gangrene: Secondary | ICD-10-CM | POA: Diagnosis not present

## 2024-05-19 DIAGNOSIS — N281 Cyst of kidney, acquired: Secondary | ICD-10-CM | POA: Diagnosis not present

## 2024-05-19 DIAGNOSIS — N3090 Cystitis, unspecified without hematuria: Secondary | ICD-10-CM | POA: Diagnosis not present

## 2024-05-19 DIAGNOSIS — R109 Unspecified abdominal pain: Secondary | ICD-10-CM | POA: Diagnosis not present

## 2024-05-19 DIAGNOSIS — K5792 Diverticulitis of intestine, part unspecified, without perforation or abscess without bleeding: Secondary | ICD-10-CM | POA: Diagnosis not present

## 2024-05-20 ENCOUNTER — Telehealth: Payer: Self-pay | Admitting: Internal Medicine

## 2024-05-20 NOTE — Telephone Encounter (Signed)
 The pt states she thought she had a bladder infection but after CT scan at [facility] she was told she has diverticulitis flare again. She is going to have the CT scan faxed to Dr Jadene Maxwell attention Please advise   Pod C see note in Dr Jadene Maxwell absence

## 2024-05-20 NOTE — Telephone Encounter (Signed)
 PT is having a diverticulitis flare. She had a CT done at [facility] and we are supposed to receive it because it showed the diverticulitis. She needs to have anitbiotics sent in to fix this. Please send to [facility].

## 2024-05-20 NOTE — Telephone Encounter (Signed)
 I wont get the fax it will go the [facility]

## 2024-05-20 NOTE — Telephone Encounter (Signed)
 Provided patient with the correct fax information

## 2024-05-21 MED ORDER — AMOXICILLIN-POT CLAVULANATE 875-125 MG PO TABS: 1.0000 | ORAL_TABLET | Freq: Two times a day (BID) | ORAL | 0 refills | Status: DC

## 2024-05-21 NOTE — Telephone Encounter (Signed)
 I found the CT scan in the PACS system. Camilo Cella will address.

## 2024-05-21 NOTE — Telephone Encounter (Signed)
 Script sent to pharmacy for Augmentin . Patient informed and will update us  in a week.

## 2024-05-21 NOTE — Telephone Encounter (Signed)
 Patient requesting f/u call to discuss previous note. Please advise.

## 2024-05-21 NOTE — Telephone Encounter (Signed)
 Frances Brown/Frances Brown have you seen any fax for this pt?

## 2024-05-27 ENCOUNTER — Telehealth: Payer: Self-pay | Admitting: Internal Medicine

## 2024-05-27 MED ORDER — FLUCONAZOLE 150 MG PO TABS: 150.0000 mg | ORAL_TABLET | Freq: Once | ORAL | 0 refills | Status: AC

## 2024-05-27 MED ORDER — FLUCONAZOLE 150 MG PO TABS: 150.0000 mg | ORAL_TABLET | Freq: Once | ORAL | 0 refills | Status: DC

## 2024-05-27 MED ORDER — AMOXICILLIN-POT CLAVULANATE 875-125 MG PO TABS: 1.0000 | ORAL_TABLET | Freq: Two times a day (BID) | ORAL | 0 refills | Status: AC

## 2024-05-27 NOTE — Telephone Encounter (Signed)
 She is requesting diflucan Sir as she is having vaginal itching from the antibiotics we gave her. She is almost done with them. She also ask if you wanted to send her in antibiotics to have on hand in case she gets another diverticulitis flare. Please advise Sir, thanks.

## 2024-05-27 NOTE — Telephone Encounter (Signed)
 I sent fluconazole to [facility] - please check if that is correct vs a [employer] (looks like that is where the Augmentin  went)  So please check on that and she can also get a refill on the Augmentin   She should contact us  if she has to take the Augmentin  again

## 2024-05-27 NOTE — Telephone Encounter (Signed)
 Patient called and stated that she was placed on a antibiotic and they advised her if she was needing a antibiotic in case she get a vaginal infection. Patient is requesting we send her that antibiotic for her vaginal infection to [facility] on [address] in [place]. Please advise.

## 2024-05-27 NOTE — Telephone Encounter (Signed)
 I have resent the flucoazole to [facility] in [place] and canceled it at [facility]. Refilled the Augmentin  as well and spoke with Monroe Antigua and she will call us  if she has to use it.

## 2024-06-08 DIAGNOSIS — M2022 Hallux rigidus, left foot: Secondary | ICD-10-CM | POA: Diagnosis not present

## 2024-06-08 DIAGNOSIS — M7741 Metatarsalgia, right foot: Secondary | ICD-10-CM | POA: Diagnosis not present

## 2024-06-08 DIAGNOSIS — M79671 Pain in right foot: Secondary | ICD-10-CM | POA: Diagnosis not present

## 2024-06-08 DIAGNOSIS — M79672 Pain in left foot: Secondary | ICD-10-CM | POA: Diagnosis not present

## 2024-06-11 DIAGNOSIS — M79671 Pain in right foot: Secondary | ICD-10-CM | POA: Diagnosis not present

## 2024-06-15 DIAGNOSIS — M7741 Metatarsalgia, right foot: Secondary | ICD-10-CM | POA: Diagnosis not present

## 2024-06-15 DIAGNOSIS — M2022 Hallux rigidus, left foot: Secondary | ICD-10-CM | POA: Diagnosis not present

## 2024-06-15 DIAGNOSIS — G609 Hereditary and idiopathic neuropathy, unspecified: Secondary | ICD-10-CM | POA: Diagnosis not present

## 2024-06-24 DIAGNOSIS — Z794 Long term (current) use of insulin: Secondary | ICD-10-CM | POA: Diagnosis not present

## 2024-06-24 DIAGNOSIS — G720 Drug-induced myopathy: Secondary | ICD-10-CM | POA: Diagnosis not present

## 2024-06-24 DIAGNOSIS — I129 Hypertensive chronic kidney disease with stage 1 through stage 4 chronic kidney disease, or unspecified chronic kidney disease: Secondary | ICD-10-CM | POA: Diagnosis not present

## 2024-06-24 DIAGNOSIS — E785 Hyperlipidemia, unspecified: Secondary | ICD-10-CM | POA: Diagnosis not present

## 2024-06-24 DIAGNOSIS — E1129 Type 2 diabetes mellitus with other diabetic kidney complication: Secondary | ICD-10-CM | POA: Diagnosis not present

## 2024-06-24 DIAGNOSIS — N1832 Chronic kidney disease, stage 3b: Secondary | ICD-10-CM | POA: Diagnosis not present

## 2024-07-02 DIAGNOSIS — L814 Other melanin hyperpigmentation: Secondary | ICD-10-CM | POA: Diagnosis not present

## 2024-07-02 DIAGNOSIS — L821 Other seborrheic keratosis: Secondary | ICD-10-CM | POA: Diagnosis not present

## 2024-07-02 DIAGNOSIS — D225 Melanocytic nevi of trunk: Secondary | ICD-10-CM | POA: Diagnosis not present

## 2024-07-07 DIAGNOSIS — N1832 Chronic kidney disease, stage 3b: Secondary | ICD-10-CM | POA: Diagnosis not present

## 2024-07-07 DIAGNOSIS — I129 Hypertensive chronic kidney disease with stage 1 through stage 4 chronic kidney disease, or unspecified chronic kidney disease: Secondary | ICD-10-CM | POA: Diagnosis not present

## 2024-07-07 DIAGNOSIS — H9313 Tinnitus, bilateral: Secondary | ICD-10-CM | POA: Diagnosis not present

## 2024-07-09 ENCOUNTER — Encounter (INDEPENDENT_AMBULATORY_CARE_PROVIDER_SITE_OTHER): Payer: Self-pay

## 2024-07-14 DIAGNOSIS — H04123 Dry eye syndrome of bilateral lacrimal glands: Secondary | ICD-10-CM | POA: Diagnosis not present

## 2024-08-12 ENCOUNTER — Ambulatory Visit: Attending: Cardiovascular Disease | Admitting: Cardiovascular Disease

## 2024-08-12 ENCOUNTER — Encounter: Payer: Self-pay | Admitting: Cardiovascular Disease

## 2024-08-12 VITALS — BP 128/74 | HR 66 | Ht 65.0 in | Wt 173.2 lb

## 2024-08-12 DIAGNOSIS — I491 Atrial premature depolarization: Secondary | ICD-10-CM | POA: Diagnosis not present

## 2024-08-12 DIAGNOSIS — I340 Nonrheumatic mitral (valve) insufficiency: Secondary | ICD-10-CM | POA: Insufficient documentation

## 2024-08-12 DIAGNOSIS — I10 Essential (primary) hypertension: Secondary | ICD-10-CM | POA: Diagnosis not present

## 2024-08-12 NOTE — Patient Instructions (Signed)
 Medication Instructions:  Your physician recommends that you continue on your current medications as directed. Please refer to the Current Medication list given to you today.  *If you need a refill on your cardiac medications before your next appointment, please call your pharmacy*  Lab Work: None ordered   Testing/Procedures: Your physician has requested that you have an echocardiogram. Echocardiography is a painless test that uses sound waves to create images of your heart. It provides your doctor with information about the size and shape of your heart and how well your heart's chambers and valves are working. This procedure takes approximately one hour. There are no restrictions for this procedure. Please do NOT wear cologne, perfume, aftershave, or lotions (deodorant is allowed). Please arrive 15 minutes prior to your appointment time.  Please note: We ask at that you not bring children with you during ultrasound (echo/ vascular) testing. Due to room size and safety concerns, children are not allowed in the ultrasound rooms during exams. Our front office staff cannot provide observation of children in our lobby area while testing is being conducted. An adult accompanying a patient to their appointment will only be allowed in the ultrasound room at the discretion of the ultrasound technician under special circumstances. We apologize for any inconvenience.   Follow-Up: At [facility], you and your health needs are our priority.  As part of our continuing mission to provide you with exceptional heart care, our providers are all part of one team.  This team includes your primary Cardiologist (physician) and Advanced Practice Providers or APPs (Physician Assistants and Nurse Practitioners) who all work together to provide you with the care you need, when you need it.  Your next appointment:   1 year(s)  Provider:   Lonni Cash, MD     Thank you for choosing [facility]!!   [phone]

## 2024-08-12 NOTE — Progress Notes (Signed)
 Chief Complaint  Patient presents with   Follow-up    SVT   History of Present Illness: 78 yo female with history of SVT, DM, HTN, HLD, mitral regurgitation and palpitations felt to be due to PACs who is here today for follow up. I saw her in 2012 for evaluation of palpitations and leg pain in the setting of steroid use for poison ivy treatment. Her echo showed normal LV size and function. Her Holter monitor showed occasional premature atrial contractions. She had c/o palpitations again in 2018  and cardiac monitor showed PACs and several short runs of SVT. Echo August 2021 with LVEF=60-65%. Mild mitral regurgitation.  She is here today for follow up. The patient denies any chest pain, dyspnea, palpitations, lower extremity edema, orthopnea, PND, dizziness, near syncope or syncope.   Primary Care Physician: Loreli Elsie JONETTA Mickey., MD  Past Medical History:  Diagnosis Date   Allergy    Anxiety    no meds    Cancer [facility])    mole    CKD (chronic kidney disease) stage 4, GFR 15-29 ml/min (HCC) 02/20/2024   Diabetes mellitus without complication (HCC)    Diverticulosis    Fibrocystic breast disease    Fundic gland polyps of stomach, benign 03/24/2015   GERD (gastroesophageal reflux disease)    Hx of colonic polyps 04/24/2017   Hyperlipidemia    Hypertension    IBS (irritable bowel syndrome)    Macular degeneration    vitreous hemorrhage, does injections in rt every 5 weeks   Obesity    Papilledema, both eyes 07/28/2018   Dr Selinda Dimes   Tachycardia, unspecified 04/08/2017   pt. states she saw Dr. Michele for this no meds no procedures, wore monitor   Urge incontinence     Past Surgical History:  Procedure Laterality Date   BREAST EXCISIONAL BIOPSY Left    over 15 years ( she thinks it may have been a cysts removal but there is a scar   breast lymph node removed     right   CHOLECYSTECTOMY     COLONOSCOPY  06/2002, 04/10/2007   diverticulosis, colon polyps, external  hemorrhoids   KNEE ARTHROSCOPY     left   PARTIAL HYSTERECTOMY     POLYPECTOMY     SHOULDER SURGERY     right   UPPER GASTROINTESTINAL ENDOSCOPY  09/27/11   VIDEO BRONCHOSCOPY Bilateral 02/14/2015   Procedure: VIDEO BRONCHOSCOPY WITHOUT FLUORO;  Surgeon: Lamar GORMAN Chris, MD;  Location: [facility];  Service: Cardiopulmonary;  Laterality: Bilateral;   WISDOM TOOTH EXTRACTION      Current Outpatient Medications  Medication Sig Dispense Refill   ACCU-CHEK GUIDE TEST test strip PT TO CHECK BLOOD SUGARS TWICE A DAY DX: E11.9 90     BD PEN NEEDLE NANO 2ND GEN 32G X 4 MM MISC SMARTSIG:injection Daily     bisoprolol (ZEBETA) 10 MG tablet Take 10 mg by mouth daily.     cholecalciferol (VITAMIN D ) 1000 units tablet Take 1,000 Units by mouth daily.     Coenzyme Q10 (COQ10) 100 MG CAPS Take 1 capsule by mouth daily.     fenofibrate 54 MG tablet Take 54 mg by mouth daily.     metFORMIN (GLUCOPHAGE-XR) 500 MG 24 hr tablet Take 500 mg by mouth daily with breakfast.     Multiple Vitamins-Minerals (PRESERVISION AREDS 2 PO) Take 1 tablet by mouth daily.     olmesartan (BENICAR) 40 MG tablet Take 1 tablet by mouth daily.  omeprazole  (PRILOSEC) 20 MG capsule Take 20 mg by mouth daily.     rosuvastatin (CRESTOR) 10 MG tablet Patient takes 3 times a week.     TOUJEO SOLOSTAR 300 UNIT/ML Solostar Pen Inject 18 Units into the skin daily.     vitamin B-12 (CYANOCOBALAMIN) 1000 MCG tablet Take 1,000 mcg by mouth daily.     No current facility-administered medications for this visit.    Allergies  Allergen Reactions   Janumet Xr [Sitaglip Phos-Metformin Hcl Er] Swelling   Tricor [Fenofibrate] Swelling   Zocor [Simvastatin] Swelling   Lovaza [Omega-3-Acid Ethyl Esters (Fish)] Other (See Comments)    GI intolerance   Sulfa Antibiotics Rash    Burning rash   Trimethoprim Rash    Rash / hives that are painful    Social History   Socioeconomic History   Marital status: Married    Spouse name: Not  on file   Number of children: 2   Years of education: Not on file   Highest education level: Not on file  Occupational History    Employer: RETIRED  Tobacco Use   Smoking status: Never   Smokeless tobacco: Never  Vaping Use   Vaping status: Never Used  Substance and Sexual Activity   Alcohol  use: No    Alcohol /week: 0.0 standard drinks of alcohol    Drug use: No   Sexual activity: Yes    Birth control/protection: Post-menopausal  Other Topics Concern   Not on file  Social History Narrative   Has one great Teacher, early years/pre, Lawyer.  Lives home with husband.  Has 2 children.  Caffeine rare.     Enjoys gardening   Social Drivers of Corporate investment banker Strain: Not on file  Food Insecurity: Patient Declined (04/16/2024)   Hunger Vital Sign    Worried About Running Out of Food in the Last Year: Patient declined    Ran Out of Food in the Last Year: Patient declined  Transportation Needs: Patient Declined (04/16/2024)   PRAPARE - Administrator, Civil Service (Medical): Patient declined    Lack of Transportation (Non-Medical): Patient declined  Physical Activity: Not on file  Stress: Not on file  Social Connections: Not on file  Intimate Partner Violence: Patient Declined (04/16/2024)   Humiliation, Afraid, Rape, and Kick questionnaire    Fear of Current or Ex-Partner: Patient declined    Emotionally Abused: Patient declined    Physically Abused: Patient declined    Sexually Abused: Patient declined    Family History  Problem Relation Age of Onset   Diabetes Father    Heart failure Father    Hypertension Mother    Heart failure Mother    Diabetes Mother    Lung cancer Mother    Stroke Mother    Leukemia Other        grandfather   Cirrhosis Other        grandmother   Hypertension Sister    Colon cancer Maternal Uncle 58   Breast cancer Maternal Aunt    Colon polyps Neg Hx    Esophageal cancer Neg Hx    Rectal cancer Neg Hx     Stomach cancer Neg Hx     Review of Systems:  As stated in the HPI and otherwise negative.   BP 128/74   Pulse 66   Ht 5' 5 (1.651 m)   Wt 173 lb 3.2 oz (78.6 kg)   SpO2 96%   BMI 28.82 kg/m  Physical Examination: General: Well developed, well nourished, NAD  HEENT: OP clear, mucus membranes moist  SKIN: warm, dry. No rashes. Neuro: No focal deficits  Musculoskeletal: Muscle strength 5/5 all ext  Psychiatric: Mood and affect normal  Neck: No JVD, no carotid bruits, no thyromegaly, no lymphadenopathy.  Lungs:Clear bilaterally, no wheezes, rhonci, crackles Cardiovascular: Regular rate and rhythm. No murmurs, gallops or rubs. Abdomen:Soft. Bowel sounds present. Non-tender.  Extremities: No lower extremity edema. Pulses are 2 + in the bilateral DP/PT.  Echo August 2021:  1. Left ventricular ejection fraction, by estimation, is 60 to 65%. The  left ventricle has normal function. The left ventricle has no regional  wall motion abnormalities. There is mild left ventricular hypertrophy of  the basal-septal segment. Left  ventricular diastolic parameters are consistent with Grade I diastolic  dysfunction (impaired relaxation). Elevated left ventricular end-diastolic  pressure.   2. Right ventricular systolic function is normal. The right ventricular  size is normal. Tricuspid regurgitation signal is inadequate for assessing  PA pressure.   3. The mitral valve is normal in structure. Mild mitral valve  regurgitation. No evidence of mitral stenosis.   4. The aortic valve is tricuspid. Aortic valve regurgitation is not  visualized. Mild aortic valve sclerosis is present, with no evidence of  aortic valve stenosis.   5. The inferior vena cava is normal in size with greater than 50%  respiratory variability, suggesting right atrial pressure of 3 mmHg.   EKG:  EKG is not ordered today. The ekg ordered today demonstrates   Recent Labs: No results found for requested labs within  last 365 days.    Wt Readings from Last 3 Encounters:  08/12/24 173 lb 3.2 oz (78.6 kg)  01/14/24 165 lb (74.8 kg)  01/01/24 165 lb 8 oz (75.1 kg)   Assessment and Plan:   1. PALPITATIONS/PAC/Paroxysmal SVT:  Echo in 2021 with normal LV size and function. No recent palpitations. Continue beta blocker.   2. HTN: BP is controlled. Continue current therapy  3. Mitral regurgitation: Mild by echo in 2021. Repeat echo now.   Labs/ tests ordered today include:   Orders Placed This Encounter  Procedures   EKG 12-Lead   ECHOCARDIOGRAM COMPLETE   Disposition:   F/U with me in 12  months  Signed, Lonni Cash, MD 08/12/2024 9:13 AM    [facility] [address] [place], [place], [place]  [address] Phone: [phone]; Fax: [phone]

## 2024-09-02 DIAGNOSIS — N3941 Urge incontinence: Secondary | ICD-10-CM | POA: Diagnosis not present

## 2024-09-02 DIAGNOSIS — N3020 Other chronic cystitis without hematuria: Secondary | ICD-10-CM | POA: Diagnosis not present

## 2024-09-02 DIAGNOSIS — N952 Postmenopausal atrophic vaginitis: Secondary | ICD-10-CM | POA: Diagnosis not present

## 2024-09-04 ENCOUNTER — Other Ambulatory Visit: Payer: Self-pay | Admitting: Internal Medicine

## 2024-09-04 DIAGNOSIS — Z1231 Encounter for screening mammogram for malignant neoplasm of breast: Secondary | ICD-10-CM

## 2024-09-08 ENCOUNTER — Encounter (INDEPENDENT_AMBULATORY_CARE_PROVIDER_SITE_OTHER): Payer: Self-pay | Admitting: Otolaryngology

## 2024-09-08 ENCOUNTER — Ambulatory Visit (INDEPENDENT_AMBULATORY_CARE_PROVIDER_SITE_OTHER): Admitting: Audiology

## 2024-09-08 ENCOUNTER — Ambulatory Visit (INDEPENDENT_AMBULATORY_CARE_PROVIDER_SITE_OTHER): Admitting: Otolaryngology

## 2024-09-08 VITALS — BP 130/69 | HR 65 | Ht 65.0 in

## 2024-09-08 DIAGNOSIS — E7849 Other hyperlipidemia: Secondary | ICD-10-CM | POA: Diagnosis not present

## 2024-09-08 DIAGNOSIS — E1129 Type 2 diabetes mellitus with other diabetic kidney complication: Secondary | ICD-10-CM | POA: Diagnosis not present

## 2024-09-08 DIAGNOSIS — N1832 Chronic kidney disease, stage 3b: Secondary | ICD-10-CM | POA: Diagnosis not present

## 2024-09-08 DIAGNOSIS — H903 Sensorineural hearing loss, bilateral: Secondary | ICD-10-CM

## 2024-09-08 DIAGNOSIS — M8580 Other specified disorders of bone density and structure, unspecified site: Secondary | ICD-10-CM | POA: Diagnosis not present

## 2024-09-08 DIAGNOSIS — Z1389 Encounter for screening for other disorder: Secondary | ICD-10-CM | POA: Diagnosis not present

## 2024-09-08 DIAGNOSIS — H9313 Tinnitus, bilateral: Secondary | ICD-10-CM

## 2024-09-08 DIAGNOSIS — H6123 Impacted cerumen, bilateral: Secondary | ICD-10-CM | POA: Diagnosis not present

## 2024-09-08 DIAGNOSIS — I129 Hypertensive chronic kidney disease with stage 1 through stage 4 chronic kidney disease, or unspecified chronic kidney disease: Secondary | ICD-10-CM | POA: Diagnosis not present

## 2024-09-08 DIAGNOSIS — H608X3 Other otitis externa, bilateral: Secondary | ICD-10-CM | POA: Diagnosis not present

## 2024-09-08 DIAGNOSIS — E785 Hyperlipidemia, unspecified: Secondary | ICD-10-CM | POA: Diagnosis not present

## 2024-09-08 DIAGNOSIS — Z794 Long term (current) use of insulin: Secondary | ICD-10-CM | POA: Diagnosis not present

## 2024-09-08 MED ORDER — FLUOCINOLONE ACETONIDE 0.01 % OT OIL: 4.0000 [drp] | TOPICAL_OIL | Freq: Two times a day (BID) | OTIC | 1 refills | Status: AC

## 2024-09-08 MED ORDER — BETAMETHASONE DIPROPIONATE 0.05 % EX CREA: TOPICAL_CREAM | Freq: Two times a day (BID) | CUTANEOUS | 1 refills | Status: AC

## 2024-09-08 NOTE — Patient Instructions (Signed)
 Use betamethasone ointment (just to outside of the ear) twice daily for 10 days; use dermotic drops 4 drops twice daily for 10 days

## 2024-09-08 NOTE — Progress Notes (Signed)
 Dear Dr. Verta, Here is my assessment for our mutual patient, Frances Brown. Thank you for allowing me the opportunity to care for your patient. Please do not hesitate to contact me should you have any other questions. Sincerely, Dr. Eldora Blanch  Otolaryngology Clinic Note Referring provider: Dr. Verta HPI:  Frances Brown is a 78 y.o. female kindly referred by Dr. Verta for evaluation of bilateral tinnitus  Initial visit (08/2024): Patient reports: She has a long-standing history of bilateral non-pulsatile tinnitus, varies. Worst during quiet, does not prevent her from sleeping. Both ears. She also has a history of cerumen impaction and now also have some itching in the ears (more so on outside). Denies eczema. Does not have any issues subjectively with hearing but known hearing loss. No ASA use; no recent med changes Patient denies: ear pain, fullness, vertigo, drainage, tinnitus Patient additionally denies: deep pain in ear canal, eustachian tube symptoms such as popping, crackling, sensitive to pressure changes Patient also denies barotrauma, vestibular suppressant use, ototoxic medication use Prior ear surgery: denies  Personal or FHx of bleeding dz or anesthesia difficulty: no  Tobacco: no  PMHx: HTN, CKD, DM  Independent Review of Additional Tests or Records:  Dr. Roark 04/21/2018: noted tinnitus both ears, some PND/nasal congestion. Schedule audio, see PCP for imbalance. Dr. Karis Outside Audio 04/21/2021 independently interpreted and Audio today (09/08/2024): both show essentially symmetric b/l SNHL with thresholds in higher frequencies starting around 2k Hz declining (approx 10dB) AU; WRT 96% AU at 70dB; A/A tymps  MRI Brain 07/31/2018 independently interpreted with respect to ears: no noted mastoid effusion; no noted retrocochlear lesions but study suboptimal as no dediated IAC cuts; partially empty sella. PMH/Meds/All/SocHx/FamHx/ROS:   Past Medical History:  Diagnosis Date    Allergy    Anxiety    no meds    Cancer (HCC)    mole    CKD (chronic kidney disease) stage 4, GFR 15-29 ml/min (HCC) 02/20/2024   Diabetes mellitus without complication (HCC)    Diverticulosis    Fibrocystic breast disease    Fundic gland polyps of stomach, benign 03/24/2015   GERD (gastroesophageal reflux disease)    Hx of colonic polyps 04/24/2017   Hyperlipidemia    Hypertension    IBS (irritable bowel syndrome)    Macular degeneration    vitreous hemorrhage, does injections in rt every 5 weeks   Obesity    Papilledema, both eyes 07/28/2018   Dr Selinda Dimes   Tachycardia, unspecified 04/08/2017   pt. states she saw Dr. Michele for this no meds no procedures, wore monitor   Urge incontinence      Past Surgical History:  Procedure Laterality Date   BREAST EXCISIONAL BIOPSY Left    over 15 years ( she thinks it may have been a cysts removal but there is a scar   breast lymph node removed     right   CHOLECYSTECTOMY     COLONOSCOPY  06/2002, 04/10/2007   diverticulosis, colon polyps, external hemorrhoids   KNEE ARTHROSCOPY     left   PARTIAL HYSTERECTOMY     POLYPECTOMY     SHOULDER SURGERY     right   UPPER GASTROINTESTINAL ENDOSCOPY  09/27/11   VIDEO BRONCHOSCOPY Bilateral 02/14/2015   Procedure: VIDEO BRONCHOSCOPY WITHOUT FLUORO;  Surgeon: Lamar GORMAN Chris, MD;  Location: [facility];  Service: Cardiopulmonary;  Laterality: Bilateral;   WISDOM TOOTH EXTRACTION      Family History  Problem Relation Age of Onset   Diabetes  Father    Heart failure Father    Hypertension Mother    Heart failure Mother    Diabetes Mother    Lung cancer Mother    Stroke Mother    Leukemia Other        grandfather   Cirrhosis Other        grandmother   Hypertension Sister    Colon cancer Maternal Uncle 48   Breast cancer Maternal Aunt    Colon polyps Neg Hx    Esophageal cancer Neg Hx    Rectal cancer Neg Hx    Stomach cancer Neg Hx      Social Connections: Not on file       Current Outpatient Medications:    ACCU-CHEK GUIDE TEST test strip, PT TO CHECK BLOOD SUGARS TWICE A DAY DX: E11.9 90, Disp: , Rfl:    BD PEN NEEDLE NANO 2ND GEN 32G X 4 MM MISC, SMARTSIG:injection Daily, Disp: , Rfl:    betamethasone dipropionate 0.05 % cream, Apply topically 2 (two) times daily for 10 days., Disp: 30 g, Rfl: 1   bisoprolol (ZEBETA) 10 MG tablet, Take 10 mg by mouth daily., Disp: , Rfl:    cholecalciferol (VITAMIN D ) 1000 units tablet, Take 1,000 Units by mouth daily., Disp: , Rfl:    Coenzyme Q10 (COQ10) 100 MG CAPS, Take 1 capsule by mouth daily., Disp: , Rfl:    fenofibrate 54 MG tablet, Take 54 mg by mouth daily., Disp: , Rfl:    Fluocinolone Acetonide (DERMOTIC) 0.01 % OIL, Place 4 drops in ear(s) in the morning and at bedtime for 10 days., Disp: 20 mL, Rfl: 1   metFORMIN (GLUCOPHAGE-XR) 500 MG 24 hr tablet, Take 500 mg by mouth daily with breakfast., Disp: , Rfl:    Multiple Vitamins-Minerals (PRESERVISION AREDS 2 PO), Take 1 tablet by mouth daily., Disp: , Rfl:    olmesartan (BENICAR) 40 MG tablet, Take 1 tablet by mouth daily., Disp: , Rfl:    omeprazole  (PRILOSEC) 20 MG capsule, Take 20 mg by mouth daily., Disp: , Rfl:    rosuvastatin (CRESTOR) 10 MG tablet, Patient takes 3 times a week., Disp: , Rfl:    TOUJEO SOLOSTAR 300 UNIT/ML Solostar Pen, Inject 18 Units into the skin daily., Disp: , Rfl:    vitamin B-12 (CYANOCOBALAMIN) 1000 MCG tablet, Take 1,000 mcg by mouth daily., Disp: , Rfl:    Physical Exam:   BP 130/69 (BP Location: Right Arm, Patient Position: Sitting, Cuff Size: Large)   Pulse 65   Ht 5' 5 (1.651 m)   SpO2 95%   BMI 28.82 kg/m   Salient findings:  CN II-XII intact Given history and complaints, ear microscopy was indicated and performed for evaluation with findings as below in physical exam section and in procedures; bilateral EAC with eczematoid change; cerumen impaction b/l; after clearance, b/l EAC clear and TM intact with well  pneumatized middle ear spaces Weber 512: mid Rinne 512: AC > BC b/l  No lesions of oral cavity/oropharynx No respiratory distress or stridor  Seprately Identifiable Procedures:  Prior to initiating any procedures, risks/benefits/alternatives were explained to the patient and verbal consent obtained. Procedure: Bilateral ear microscopy and cerumen removal using microscope (CPT [phone]) - Mod 25 Pre-procedure diagnosis: Cerumen impaction bilateral external ears Post-procedure diagnosis: same Indication: Bilateral cerumen impaction; given patient's otologic complaints and history as well as for improved and comprehensive examination of external ear and tympanic membrane, bilateral otologic examination using microscope was performed and impacted cerumen  removed  Procedure: Patient was placed semi-recumbent. Both ear canals were examined using the microscope with findings above. Impacted Cerumen removed on left and on right using suction and currette with improvement in EAC examination and patency. Patient tolerated the procedure well.  Impression & Plans:  Jailen Lung is a 78 y.o. female with:  1. Bilateral tinnitus   2. Chronic eczematous otitis externa of both ears   3. Sensorineural hearing loss (SNHL) of both ears   4. Bilateral impacted cerumen    Noted b/l tinnitus, hearing thresholds slight decline but consistent with presbycusis. Discussed mgmt including HA (not ready), masking (can try), flavonoids, retraining. SNHL - wishes to observe; advised to call if she wishes for amplification Eczematoid OE: will treat with betamethasone and dermotic BID x2 weeks Impacted cerumen cleared  - f/u d/w pt, opted PRN  See below regarding exact medications prescribed this encounter including dosages and route: Meds ordered this encounter  Medications   Fluocinolone Acetonide (DERMOTIC) 0.01 % OIL    Sig: Place 4 drops in ear(s) in the morning and at bedtime for 10 days.    Dispense:  20 mL     Refill:  1   betamethasone dipropionate 0.05 % cream    Sig: Apply topically 2 (two) times daily for 10 days.    Dispense:  30 g    Refill:  1      Thank you for allowing me the opportunity to care for your patient. Please do not hesitate to contact me should you have any other questions.  Sincerely, Eldora Blanch, MD Otolaryngologist (ENT), [facility] Phone: [phone] Fax: [phone]  09/08/2024, 1:06 PM   MDM:  Level 4 - [id number] Complexity/Problems addressed: mod - chronic problems, multiple Data complexity: mod - independent interpretation of outside testing; independent imaging interpretation - Morbidity: mod  - Prescription Drug prescribed or managed: y

## 2024-09-08 NOTE — Progress Notes (Signed)
  [address] [place], [place] [address] [phone]  Audiological Evaluation    Name: KEARSTYN AVITIA     DOB:   [birth year]      MRN:   [id number]                                                                                     Service Date: 09/08/2024     Accompanied by: unaccompanied   Patient comes today after Dr. Tobie, ENT sent a referral for a hearing evaluation due to concerns with tinnitus.   Symptoms Yes Details  Hearing loss  [x]  Not perceived by patient  Tinnitus  [x]  Both ears- sometimes louder  Ear pain/ infections/pressure  []    Balance problems  []    Noise exposure history  []    Previous ear surgeries  []    Family history of hearing loss  []    Amplification  []    Other  []      Otoscopy: Right ear: Clear external ear canal and notable landmarks visualized on the tympanic membrane. Left ear:  Clear external ear canal and notable landmarks visualized on the tympanic membrane.  Tympanometry: Right ear: Type A- Normal external ear canal volume with normal middle ear pressure and tympanic membrane compliance. Left ear: Type A- Normal external ear canal volume with normal middle ear pressure and tympanic membrane compliance.  Pure tone Audiometry: Right ear- Normal to moderately severe sensorineural hearing loss from 250 Hz - 8000 Hz. Left ear-  Normal to moderately severe sensorineural hearing loss from 250 Hz - 8000 Hz.  Speech Audiometry: Right ear- Speech Reception Threshold (SRT) was obtained at 20 dBHL. Left ear-Speech Reception Threshold (SRT) was obtained at 15 dBHL.   Word Recognition Score Tested using NU-6 (recorded) Right ear: 96% was obtained at a presentation level of 70 dBHL with contralateral masking which is deemed as  excellent. Left ear: 96% was obtained at a presentation level of 70 dBHL with contralateral masking which is deemed as  excellent.   The hearing test results were completed under headphones and results are  deemed to be of good reliability. Test technique:  conventional    Impression: There is not a significant difference in pure-tone thresholds between ears. There is not a significant difference in the word recognition score in between ears.  Previous audiogram at Dr. Rojean clinic from 04-21-21 show an average decline of 10dBHL in both ears in the mid/high frequencies.   Recommendations: Follow up with ENT as scheduled for today. Return for a hearing evaluation if concerns with hearing changes arise or per MD recommendation. Consider a communication needs assessment after medical clearance for hearing aids is obtained. Consider various tinnitus strategies, including the use of a sound generator, hearing aids, and/or tinnitus retraining therapy.   Rorie Delmore MARIE LEROUX-MARTINEZ, AUD

## 2024-09-10 ENCOUNTER — Ambulatory Visit: Payer: Self-pay | Admitting: Cardiovascular Disease

## 2024-09-10 ENCOUNTER — Ambulatory Visit (HOSPITAL_COMMUNITY)
Admission: RE | Admit: 2024-09-10 | Discharge: 2024-09-10 | Disposition: A | Discharge status: Home / Self Care | Source: Ambulatory Visit | Attending: Cardiology | Admitting: Cardiology

## 2024-09-10 DIAGNOSIS — I340 Nonrheumatic mitral (valve) insufficiency: Secondary | ICD-10-CM | POA: Insufficient documentation

## 2024-09-10 LAB — ECHOCARDIOGRAM COMPLETE
Area-P 1/2: 4.31 cm2
S' Lateral: 2.6 cm

## 2024-09-11 ENCOUNTER — Telehealth: Payer: Self-pay | Admitting: Cardiovascular Disease

## 2024-09-11 NOTE — Telephone Encounter (Signed)
 Pt requesting a c/b to go over results.

## 2024-09-11 NOTE — Telephone Encounter (Signed)
 Spoke with patient and shared echo results per Dr. Verlin:  Her heart is strong. Very mild leakiness of the mitral valve. cdm   Patient verbalized understanding and expressed appreciation for call.

## 2024-09-14 DIAGNOSIS — G932 Benign intracranial hypertension: Secondary | ICD-10-CM | POA: Diagnosis not present

## 2024-09-14 DIAGNOSIS — E781 Pure hyperglyceridemia: Secondary | ICD-10-CM | POA: Diagnosis not present

## 2024-09-14 DIAGNOSIS — Z1339 Encounter for screening examination for other mental health and behavioral disorders: Secondary | ICD-10-CM | POA: Diagnosis not present

## 2024-09-14 DIAGNOSIS — Z0000 Encounter for general adult medical examination without abnormal findings: Secondary | ICD-10-CM | POA: Diagnosis not present

## 2024-09-14 DIAGNOSIS — I700 Atherosclerosis of aorta: Secondary | ICD-10-CM | POA: Diagnosis not present

## 2024-09-14 DIAGNOSIS — Z1331 Encounter for screening for depression: Secondary | ICD-10-CM | POA: Diagnosis not present

## 2024-09-14 DIAGNOSIS — R82998 Other abnormal findings in urine: Secondary | ICD-10-CM | POA: Diagnosis not present

## 2024-09-14 DIAGNOSIS — G629 Polyneuropathy, unspecified: Secondary | ICD-10-CM | POA: Diagnosis not present

## 2024-09-14 DIAGNOSIS — E1122 Type 2 diabetes mellitus with diabetic chronic kidney disease: Secondary | ICD-10-CM | POA: Diagnosis not present

## 2024-09-14 DIAGNOSIS — N1832 Chronic kidney disease, stage 3b: Secondary | ICD-10-CM | POA: Diagnosis not present

## 2024-09-14 DIAGNOSIS — I129 Hypertensive chronic kidney disease with stage 1 through stage 4 chronic kidney disease, or unspecified chronic kidney disease: Secondary | ICD-10-CM | POA: Diagnosis not present

## 2024-09-14 DIAGNOSIS — R1319 Other dysphagia: Secondary | ICD-10-CM | POA: Diagnosis not present

## 2024-09-14 DIAGNOSIS — E785 Hyperlipidemia, unspecified: Secondary | ICD-10-CM | POA: Diagnosis not present

## 2024-09-14 DIAGNOSIS — M8580 Other specified disorders of bone density and structure, unspecified site: Secondary | ICD-10-CM | POA: Diagnosis not present

## 2024-09-14 DIAGNOSIS — K529 Noninfective gastroenteritis and colitis, unspecified: Secondary | ICD-10-CM | POA: Diagnosis not present

## 2024-09-14 DIAGNOSIS — H353210 Exudative age-related macular degeneration, right eye, stage unspecified: Secondary | ICD-10-CM | POA: Diagnosis not present

## 2024-09-17 DIAGNOSIS — H353134 Nonexudative age-related macular degeneration, bilateral, advanced atrophic with subfoveal involvement: Secondary | ICD-10-CM | POA: Diagnosis not present

## 2024-09-17 DIAGNOSIS — H353212 Exudative age-related macular degeneration, right eye, with inactive choroidal neovascularization: Secondary | ICD-10-CM | POA: Diagnosis not present

## 2024-09-17 DIAGNOSIS — H31013 Macula scars of posterior pole (postinflammatory) (post-traumatic), bilateral: Secondary | ICD-10-CM | POA: Diagnosis not present

## 2024-09-17 DIAGNOSIS — E113593 Type 2 diabetes mellitus with proliferative diabetic retinopathy without macular edema, bilateral: Secondary | ICD-10-CM | POA: Diagnosis not present

## 2024-09-18 ENCOUNTER — Other Ambulatory Visit (HOSPITAL_COMMUNITY)

## 2024-09-18 ENCOUNTER — Ambulatory Visit (HOSPITAL_COMMUNITY)

## 2024-11-18 ENCOUNTER — Ambulatory Visit
Admission: RE | Admit: 2024-11-18 | Discharge: 2024-11-18 | Disposition: A | Discharge status: Home / Self Care | Source: Ambulatory Visit | Attending: Internal Medicine | Admitting: Internal Medicine

## 2024-11-18 DIAGNOSIS — H353134 Nonexudative age-related macular degeneration, bilateral, advanced atrophic with subfoveal involvement: Secondary | ICD-10-CM | POA: Diagnosis not present

## 2024-11-18 DIAGNOSIS — H31013 Macula scars of posterior pole (postinflammatory) (post-traumatic), bilateral: Secondary | ICD-10-CM | POA: Diagnosis not present

## 2024-11-18 DIAGNOSIS — E113593 Type 2 diabetes mellitus with proliferative diabetic retinopathy without macular edema, bilateral: Secondary | ICD-10-CM | POA: Diagnosis not present

## 2024-11-18 DIAGNOSIS — Z1231 Encounter for screening mammogram for malignant neoplasm of breast: Secondary | ICD-10-CM

## 2024-11-18 DIAGNOSIS — H353212 Exudative age-related macular degeneration, right eye, with inactive choroidal neovascularization: Secondary | ICD-10-CM | POA: Diagnosis not present

## 2024-11-20 DIAGNOSIS — H353212 Exudative age-related macular degeneration, right eye, with inactive choroidal neovascularization: Secondary | ICD-10-CM | POA: Diagnosis not present
